# Patient Record
Sex: Male | Born: 1970 | Race: White | Hispanic: No | Marital: Married | Smoking: Current every day smoker
Health system: Southern US, Community
[De-identification: ages and names within clinical notes are randomized; demographics above are authoritative.]

## PROBLEM LIST (undated history)

## (undated) DIAGNOSIS — G8929 Other chronic pain: Secondary | ICD-10-CM

## (undated) DIAGNOSIS — J302 Other seasonal allergic rhinitis: Secondary | ICD-10-CM

## (undated) DIAGNOSIS — I480 Paroxysmal atrial fibrillation: Secondary | ICD-10-CM

## (undated) DIAGNOSIS — IMO0002 Reserved for concepts with insufficient information to code with codable children: Secondary | ICD-10-CM

## (undated) DIAGNOSIS — G4733 Obstructive sleep apnea (adult) (pediatric): Secondary | ICD-10-CM

## (undated) DIAGNOSIS — E785 Hyperlipidemia, unspecified: Secondary | ICD-10-CM

## (undated) DIAGNOSIS — Q0700 Arnold-Chiari syndrome without spina bifida or hydrocephalus: Secondary | ICD-10-CM

## (undated) DIAGNOSIS — K635 Polyp of colon: Secondary | ICD-10-CM

## (undated) DIAGNOSIS — G4730 Sleep apnea, unspecified: Secondary | ICD-10-CM

## (undated) DIAGNOSIS — Z9989 Dependence on other enabling machines and devices: Secondary | ICD-10-CM

## (undated) DIAGNOSIS — K648 Other hemorrhoids: Secondary | ICD-10-CM

## (undated) DIAGNOSIS — Z87442 Personal history of urinary calculi: Secondary | ICD-10-CM

## (undated) HISTORY — PX: ADENOIDECTOMY: SUR15

## (undated) HISTORY — DX: Obstructive sleep apnea (adult) (pediatric): G47.33

## (undated) HISTORY — DX: Arnold-Chiari syndrome without spina bifida or hydrocephalus: Q07.00

## (undated) HISTORY — DX: Other seasonal allergic rhinitis: J30.2

## (undated) HISTORY — DX: Other hemorrhoids: K64.8

## (undated) HISTORY — DX: Sleep apnea, unspecified: G47.30

## (undated) HISTORY — DX: Hyperlipidemia, unspecified: E78.5

## (undated) HISTORY — DX: Polyp of colon: K63.5

## (undated) HISTORY — DX: Paroxysmal atrial fibrillation: I48.0

## (undated) HISTORY — PX: CARDIOVERSION: SHX1299

## (undated) HISTORY — PX: SUBOCCIPITAL CRANIECTOMY CERVICAL LAMINECTOMY: SHX5404

## (undated) HISTORY — DX: Dependence on other enabling machines and devices: Z99.89

---

## 1997-09-20 HISTORY — PX: HIATAL HERNIA REPAIR: SHX195

## 1997-12-28 ENCOUNTER — Inpatient Hospital Stay (HOSPITAL_COMMUNITY): Admission: EM | Admit: 1997-12-28 | Discharge: 1997-12-29 | Payer: Self-pay | Admitting: Emergency Medicine

## 1998-06-25 ENCOUNTER — Observation Stay (HOSPITAL_COMMUNITY): Admission: EM | Admit: 1998-06-25 | Discharge: 1998-06-25 | Payer: Self-pay | Admitting: Emergency Medicine

## 1998-07-28 ENCOUNTER — Encounter: Admission: RE | Admit: 1998-07-28 | Discharge: 1998-10-20 | Payer: Self-pay | Admitting: Anesthesiology

## 1998-10-10 ENCOUNTER — Encounter: Payer: Self-pay | Admitting: Internal Medicine

## 1998-10-20 ENCOUNTER — Encounter: Payer: Self-pay | Admitting: Internal Medicine

## 1999-06-01 ENCOUNTER — Ambulatory Visit (HOSPITAL_COMMUNITY): Admission: RE | Admit: 1999-06-01 | Discharge: 1999-06-01 | Payer: Self-pay | Admitting: Neurosurgery

## 1999-06-01 ENCOUNTER — Encounter: Payer: Self-pay | Admitting: Neurosurgery

## 1999-08-15 ENCOUNTER — Encounter: Payer: Self-pay | Admitting: Cardiology

## 1999-08-15 ENCOUNTER — Emergency Department (HOSPITAL_COMMUNITY): Admission: EM | Admit: 1999-08-15 | Discharge: 1999-08-15 | Payer: Self-pay | Admitting: Emergency Medicine

## 2000-09-20 DIAGNOSIS — Q0700 Arnold-Chiari syndrome without spina bifida or hydrocephalus: Secondary | ICD-10-CM

## 2000-09-20 HISTORY — DX: Arnold-Chiari syndrome without spina bifida or hydrocephalus: Q07.00

## 2000-10-02 ENCOUNTER — Ambulatory Visit (HOSPITAL_COMMUNITY): Admission: RE | Admit: 2000-10-02 | Discharge: 2000-10-02 | Payer: Self-pay | Admitting: Neurosurgery

## 2000-10-02 ENCOUNTER — Encounter: Payer: Self-pay | Admitting: Neurosurgery

## 2000-11-28 ENCOUNTER — Encounter: Payer: Self-pay | Admitting: Neurosurgery

## 2000-11-30 ENCOUNTER — Encounter: Payer: Self-pay | Admitting: Neurosurgery

## 2000-11-30 ENCOUNTER — Inpatient Hospital Stay (HOSPITAL_COMMUNITY): Admission: RE | Admit: 2000-11-30 | Discharge: 2000-12-03 | Payer: Self-pay | Admitting: Neurosurgery

## 2000-12-01 ENCOUNTER — Encounter: Payer: Self-pay | Admitting: Neurosurgery

## 2003-01-15 ENCOUNTER — Encounter: Admission: RE | Admit: 2003-01-15 | Discharge: 2003-01-15 | Payer: Self-pay | Admitting: Internal Medicine

## 2003-01-15 ENCOUNTER — Encounter: Payer: Self-pay | Admitting: Internal Medicine

## 2004-10-19 ENCOUNTER — Ambulatory Visit: Payer: Self-pay | Admitting: Family Medicine

## 2005-07-06 ENCOUNTER — Ambulatory Visit: Payer: Self-pay | Admitting: Internal Medicine

## 2005-07-07 ENCOUNTER — Ambulatory Visit: Payer: Self-pay | Admitting: Cardiology

## 2005-07-07 ENCOUNTER — Ambulatory Visit: Payer: Self-pay | Admitting: Internal Medicine

## 2005-10-26 ENCOUNTER — Ambulatory Visit: Payer: Self-pay | Admitting: Internal Medicine

## 2005-11-09 ENCOUNTER — Ambulatory Visit: Payer: Self-pay | Admitting: Cardiology

## 2005-11-16 ENCOUNTER — Encounter: Payer: Self-pay | Admitting: Pulmonary Disease

## 2005-11-16 ENCOUNTER — Ambulatory Visit (HOSPITAL_BASED_OUTPATIENT_CLINIC_OR_DEPARTMENT_OTHER): Admission: RE | Admit: 2005-11-16 | Discharge: 2005-11-16 | Payer: Self-pay | Admitting: Internal Medicine

## 2005-11-20 ENCOUNTER — Ambulatory Visit: Payer: Self-pay | Admitting: Pulmonary Disease

## 2005-12-15 ENCOUNTER — Ambulatory Visit: Payer: Self-pay | Admitting: Pulmonary Disease

## 2006-01-14 ENCOUNTER — Ambulatory Visit: Payer: Self-pay | Admitting: Pulmonary Disease

## 2006-04-20 ENCOUNTER — Emergency Department (HOSPITAL_COMMUNITY): Admission: EM | Admit: 2006-04-20 | Discharge: 2006-04-20 | Payer: Self-pay | Admitting: Emergency Medicine

## 2006-04-20 ENCOUNTER — Ambulatory Visit: Payer: Self-pay | Admitting: Cardiovascular Disease

## 2006-04-29 ENCOUNTER — Ambulatory Visit: Payer: Self-pay | Admitting: Cardiology

## 2006-11-08 ENCOUNTER — Encounter: Admission: RE | Admit: 2006-11-08 | Discharge: 2006-11-08 | Payer: Self-pay | Admitting: Orthopedic Surgery

## 2007-07-17 ENCOUNTER — Ambulatory Visit: Payer: Self-pay | Admitting: Internal Medicine

## 2007-07-17 ENCOUNTER — Telehealth (INDEPENDENT_AMBULATORY_CARE_PROVIDER_SITE_OTHER): Payer: Self-pay | Admitting: *Deleted

## 2007-08-27 ENCOUNTER — Emergency Department (HOSPITAL_COMMUNITY): Admission: EM | Admit: 2007-08-27 | Discharge: 2007-08-27 | Payer: Self-pay | Admitting: Emergency Medicine

## 2007-08-27 ENCOUNTER — Ambulatory Visit: Payer: Self-pay | Admitting: Internal Medicine

## 2007-09-19 ENCOUNTER — Ambulatory Visit: Payer: Self-pay | Admitting: Internal Medicine

## 2008-01-08 ENCOUNTER — Ambulatory Visit: Payer: Self-pay | Admitting: Internal Medicine

## 2008-07-04 ENCOUNTER — Ambulatory Visit: Payer: Self-pay | Admitting: Internal Medicine

## 2008-07-04 LAB — CONVERTED CEMR LAB
Bilirubin Urine: NEGATIVE
Blood in Urine, dipstick: NEGATIVE
Glucose, Urine, Semiquant: NEGATIVE
Inflenza A Ag: NEGATIVE
Influenza B Ag: NEGATIVE
Ketones, urine, test strip: NEGATIVE
Nitrite: NEGATIVE
Protein, U semiquant: NEGATIVE
Specific Gravity, Urine: 1.015
Urobilinogen, UA: 0.2
WBC Urine, dipstick: NEGATIVE
pH: 6

## 2008-07-31 ENCOUNTER — Ambulatory Visit: Payer: Self-pay | Admitting: Internal Medicine

## 2008-07-31 DIAGNOSIS — H8110 Benign paroxysmal vertigo, unspecified ear: Secondary | ICD-10-CM | POA: Insufficient documentation

## 2008-07-31 DIAGNOSIS — R519 Headache, unspecified: Secondary | ICD-10-CM | POA: Insufficient documentation

## 2008-07-31 DIAGNOSIS — R51 Headache: Secondary | ICD-10-CM | POA: Insufficient documentation

## 2008-10-18 ENCOUNTER — Ambulatory Visit: Payer: Self-pay | Admitting: Family Medicine

## 2008-10-18 DIAGNOSIS — S139XXA Sprain of joints and ligaments of unspecified parts of neck, initial encounter: Secondary | ICD-10-CM | POA: Insufficient documentation

## 2008-10-18 LAB — CONVERTED CEMR LAB
Inflenza A Ag: NEGATIVE
Influenza B Ag: NEGATIVE
Rapid Strep: NEGATIVE

## 2008-10-29 ENCOUNTER — Telehealth: Payer: Self-pay | Admitting: Family Medicine

## 2008-11-01 ENCOUNTER — Telehealth (INDEPENDENT_AMBULATORY_CARE_PROVIDER_SITE_OTHER): Payer: Self-pay | Admitting: *Deleted

## 2009-01-15 DIAGNOSIS — E785 Hyperlipidemia, unspecified: Secondary | ICD-10-CM | POA: Insufficient documentation

## 2009-01-15 DIAGNOSIS — I4891 Unspecified atrial fibrillation: Secondary | ICD-10-CM | POA: Insufficient documentation

## 2009-01-16 ENCOUNTER — Ambulatory Visit: Payer: Self-pay | Admitting: Internal Medicine

## 2009-01-16 ENCOUNTER — Encounter: Payer: Self-pay | Admitting: Cardiology

## 2009-03-25 ENCOUNTER — Ambulatory Visit: Payer: Self-pay | Admitting: Family Medicine

## 2009-03-25 DIAGNOSIS — M549 Dorsalgia, unspecified: Secondary | ICD-10-CM | POA: Insufficient documentation

## 2009-04-21 ENCOUNTER — Encounter: Payer: Self-pay | Admitting: Cardiology

## 2009-05-02 ENCOUNTER — Telehealth: Payer: Self-pay | Admitting: Internal Medicine

## 2009-06-02 ENCOUNTER — Ambulatory Visit: Payer: Self-pay | Admitting: Internal Medicine

## 2009-06-06 ENCOUNTER — Encounter (INDEPENDENT_AMBULATORY_CARE_PROVIDER_SITE_OTHER): Payer: Self-pay | Admitting: *Deleted

## 2009-06-06 LAB — CONVERTED CEMR LAB
Basophils Absolute: 0.2 10*3/uL — ABNORMAL HIGH (ref 0.0–0.1)
Basophils Relative: 1 % (ref 0.0–3.0)
Eosinophils Absolute: 0.1 10*3/uL (ref 0.0–0.7)
Eosinophils Relative: 5 % (ref 0.0–5.0)
HCT: 43.5 % (ref 39.0–52.0)
Hemoglobin: 14.2 g/dL (ref 13.0–17.0)
Lymphocytes Relative: 46 % (ref 12.0–46.0)
Lymphs Abs: 1.9 10*3/uL (ref 0.7–4.0)
MCHC: 32.5 g/dL (ref 30.0–36.0)
MCV: 83.2 fL (ref 78.0–100.0)
Monocytes Absolute: 1.4 10*3/uL — ABNORMAL HIGH (ref 0.1–1.0)
Monocytes Relative: 18 % — ABNORMAL HIGH (ref 3.0–12.0)
Neutro Abs: 1.9 10*3/uL (ref 1.4–7.7)
Neutrophils Relative %: 30 % — ABNORMAL LOW (ref 43.0–77.0)
Platelets: 187 10*3/uL (ref 150.0–400.0)
RBC: 5.23 M/uL (ref 4.22–5.81)
RDW: 13.3 % (ref 11.5–14.6)
WBC: 5.5 10*3/uL (ref 4.5–10.5)

## 2009-09-18 ENCOUNTER — Ambulatory Visit: Payer: Self-pay | Admitting: Internal Medicine

## 2009-09-20 DIAGNOSIS — K635 Polyp of colon: Secondary | ICD-10-CM

## 2009-09-20 HISTORY — DX: Polyp of colon: K63.5

## 2009-09-20 HISTORY — PX: COLONOSCOPY: SHX174

## 2009-12-24 ENCOUNTER — Encounter: Payer: Self-pay | Admitting: Internal Medicine

## 2009-12-25 ENCOUNTER — Encounter: Payer: Self-pay | Admitting: Pulmonary Disease

## 2010-02-24 ENCOUNTER — Ambulatory Visit: Payer: Self-pay | Admitting: Internal Medicine

## 2010-02-24 DIAGNOSIS — G8929 Other chronic pain: Secondary | ICD-10-CM | POA: Insufficient documentation

## 2010-02-24 DIAGNOSIS — M545 Low back pain, unspecified: Secondary | ICD-10-CM | POA: Insufficient documentation

## 2010-02-24 DIAGNOSIS — IMO0002 Reserved for concepts with insufficient information to code with codable children: Secondary | ICD-10-CM | POA: Insufficient documentation

## 2010-02-24 DIAGNOSIS — M546 Pain in thoracic spine: Secondary | ICD-10-CM | POA: Insufficient documentation

## 2010-02-25 ENCOUNTER — Ambulatory Visit: Payer: Self-pay | Admitting: Internal Medicine

## 2010-02-27 ENCOUNTER — Encounter (INDEPENDENT_AMBULATORY_CARE_PROVIDER_SITE_OTHER): Payer: Self-pay | Admitting: *Deleted

## 2010-03-04 ENCOUNTER — Telehealth (INDEPENDENT_AMBULATORY_CARE_PROVIDER_SITE_OTHER): Payer: Self-pay | Admitting: *Deleted

## 2010-03-15 ENCOUNTER — Encounter: Payer: Self-pay | Admitting: Pulmonary Disease

## 2010-03-16 ENCOUNTER — Ambulatory Visit: Payer: Self-pay | Admitting: Pulmonary Disease

## 2010-03-16 DIAGNOSIS — Z9989 Dependence on other enabling machines and devices: Secondary | ICD-10-CM

## 2010-03-16 DIAGNOSIS — G4733 Obstructive sleep apnea (adult) (pediatric): Secondary | ICD-10-CM | POA: Insufficient documentation

## 2010-04-06 ENCOUNTER — Ambulatory Visit: Payer: Self-pay | Admitting: Internal Medicine

## 2010-04-09 ENCOUNTER — Ambulatory Visit: Payer: Self-pay | Admitting: Internal Medicine

## 2010-04-09 DIAGNOSIS — K219 Gastro-esophageal reflux disease without esophagitis: Secondary | ICD-10-CM | POA: Insufficient documentation

## 2010-04-10 ENCOUNTER — Ambulatory Visit: Payer: Self-pay | Admitting: Internal Medicine

## 2010-04-10 LAB — HM COLONOSCOPY

## 2010-04-13 ENCOUNTER — Encounter: Payer: Self-pay | Admitting: Internal Medicine

## 2010-05-18 ENCOUNTER — Encounter: Payer: Self-pay | Admitting: Internal Medicine

## 2010-05-27 ENCOUNTER — Ambulatory Visit: Payer: Self-pay | Admitting: Internal Medicine

## 2010-06-17 ENCOUNTER — Encounter: Payer: Self-pay | Admitting: Internal Medicine

## 2010-07-28 ENCOUNTER — Encounter: Payer: Self-pay | Admitting: Internal Medicine

## 2010-08-04 ENCOUNTER — Ambulatory Visit: Payer: Self-pay | Admitting: Internal Medicine

## 2010-08-07 ENCOUNTER — Encounter: Payer: Self-pay | Admitting: Internal Medicine

## 2010-08-12 ENCOUNTER — Encounter: Payer: Self-pay | Admitting: Internal Medicine

## 2010-09-02 ENCOUNTER — Ambulatory Visit: Payer: Self-pay | Admitting: Internal Medicine

## 2010-09-12 ENCOUNTER — Telehealth: Payer: Self-pay | Admitting: Adult Health

## 2010-09-15 ENCOUNTER — Telehealth: Payer: Self-pay | Admitting: Internal Medicine

## 2010-09-15 ENCOUNTER — Encounter: Payer: Self-pay | Admitting: Internal Medicine

## 2010-09-15 ENCOUNTER — Telehealth (INDEPENDENT_AMBULATORY_CARE_PROVIDER_SITE_OTHER): Payer: Self-pay | Admitting: *Deleted

## 2010-09-22 ENCOUNTER — Ambulatory Visit
Admission: RE | Admit: 2010-09-22 | Discharge: 2010-09-22 | Payer: Self-pay | Source: Home / Self Care | Attending: Internal Medicine | Admitting: Internal Medicine

## 2010-09-22 ENCOUNTER — Encounter: Payer: Self-pay | Admitting: Internal Medicine

## 2010-09-22 DIAGNOSIS — M5126 Other intervertebral disc displacement, lumbar region: Secondary | ICD-10-CM | POA: Insufficient documentation

## 2010-09-24 ENCOUNTER — Telehealth: Payer: Self-pay | Admitting: Internal Medicine

## 2010-09-24 ENCOUNTER — Encounter: Payer: Self-pay | Admitting: Internal Medicine

## 2010-10-07 LAB — BASIC METABOLIC PANEL
BUN: 14 mg/dL (ref 6–23)
CO2: 28 mEq/L (ref 19–32)
Calcium: 9.2 mg/dL (ref 8.4–10.5)
Chloride: 104 mEq/L (ref 96–112)
Creatinine, Ser: 1.19 mg/dL (ref 0.4–1.5)
GFR calc Af Amer: >60 mL/min (ref >60)
GFR calc non Af Amer: >60 mL/min (ref >60)
Glucose, Bld: 122 mg/dL — ABNORMAL HIGH (ref 70–99)
Potassium: 4.3 mEq/L (ref 3.5–5.1)
Sodium: 141 mEq/L (ref 135–145)

## 2010-10-07 LAB — CBC
HCT: 40.6 % (ref 39.0–52.0)
Hemoglobin: 13.3 g/dL (ref 13.0–17.0)
MCH: 27.4 pg (ref 26.0–34.0)
MCHC: 32.8 g/dL (ref 30.0–36.0)
MCV: 83.7 fL (ref 78.0–100.0)
Platelets: 207 10*3/uL (ref 150–400)
RBC: 4.85 MIL/uL (ref 4.22–5.81)
RDW: 13.6 % (ref 11.5–15.5)
WBC: 10.3 10*3/uL (ref 4.0–10.5)

## 2010-10-07 LAB — SURGICAL PCR SCREEN
MRSA, PCR: NEGATIVE
Staphylococcus aureus: NEGATIVE

## 2010-10-08 ENCOUNTER — Ambulatory Visit (HOSPITAL_COMMUNITY)
Admission: RE | Admit: 2010-10-08 | Discharge: 2010-10-09 | Payer: Self-pay | Source: Home / Self Care | Attending: Orthopedic Surgery | Admitting: Orthopedic Surgery

## 2010-10-08 HISTORY — PX: OTHER SURGICAL HISTORY: SHX169

## 2010-10-15 NOTE — Op Note (Addendum)
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  OPERATIVE PROCEDURE:  Anterior lumbar diskectomy and decompression at L5- S1, left side.  COMPLICATIONS:  None.  CONDITION:  Stable.  BLOOD LOSS:  Minimal.  HISTORY:  This is a very pleasant 40 year old gentleman who is having significant back, buttock, and left leg pain.  He had a positive straight leg raise test, reproduction of pain with straight leg maneuvering and release of symptoms with a 5-1 injection.  After discussing treatment options, we elected proceed with a lumbar decompression.  I did tell him that if it still remained tight that I would extend the decompression up to 4-5.  However intraoperatively once I had the bone spur and the diskectomy complete at 5-1, I was able to freely pass a Woodson elevator superiorly towards L4 and there was no significant compression and so I elected not to extend the decompression.  OPERATIVE NOTE:  The patient was brought to the operating room and placed supine on the operating table.  After successful induction of general anesthesia and intubation, TEDs and SCDs were applied.  He was turned prone onto the Blue Ball frame.  All bony prominences were well padded.  The back was prepped and draped in a standard fashion. Appropriate time-out was done to confirm patient, procedure, extremity affected, and all other pertinent port data.  Once this was completed, two needles were placed in the lateral side of the spine and an x-ray was taken for  marker films.  An incision was made approximately an inch and half in length.  Sharp dissection was carried out down to and through the deep fascia.  Deep fascia was sharply incised in the midline and I used Cobb elevator and stripped paraspinal muscles to expose the L5-S1 space.  A Penfield #4 was placed underneath the L5 lamina. Intraoperative x-ray was taken to confirm that we are at the appropriate level.  Once this was done, a Cytogeneticist was placed along the lateral aspect of the facet and I performed a generous laminotomy of L5. I then performed a partial laminotomy of the superior S1 and then I released the ligamentum flavum.  At this point, I noted a very large epidural vein which I coagulated with bipolar electrocautery.  I mobilized the significant epidural fat and exposed the thecal sac.  I then identified the S1 traversing nerve root, protected it with D'Errico and then continued my dissection on the lateral recess.  I did a very wide decompression in order to adequately decompress the lateral recess. I then went down and performed a generous foraminotomy.  At this point, I had an excellent bony decompression.  I then incised  the annulus with a #15 blade scalpel and using micropituitary rongeurs, curettes, and an Epstein curette I was able to remove the bulk of the soft disk and debride the significant osteophyte.  At this point, I had an adequate diskectomy and I was able to relieve most of the osteophyte.  At this point, I could take a [facility] and passed it circumferentially at the disk space and I could go superiorly in the lateral recess and centrally and inferiorly and down the lateral recess and out the S1 neural foramen without any neural compression.  At this point, I was satisfied with the decompression.  I irrigated it copiously with normal saline, obtained hemostasis using bipolar electrocautery, and then placed a thrombin-soaked Gelfoam patty over the  exposed thecal sac and then closed the deep fascia with interrupted #1 Vicryl sutures, superficial with 2-0 Vicryl sutures and 3-0 Monocryl.  Steri-Strips and dry dressing were applied.  The patient was extubated and transferred to PACU without incident.  At the end of the case, needle and sponge counts were correct.     Alvy Beal, MD     DDB/MEDQ  D:  10/08/2010  T:  10/09/2010  Job:  [id number]  Electronically Signed by Venita Lick MD on 10/15/2010 11:15:45 AM

## 2010-10-22 NOTE — Letter (Signed)
Summary: [facility] Surgical Clearance   [facility] Surgical Clearance   Imported By: Roderic Ovens 10/16/2010 14:52:39  _____________________________________________________________________  External Attachment:    Type:   Image     Comment:   External Document

## 2010-10-22 NOTE — Assessment & Plan Note (Signed)
Summary: discuss back issue/cbs   Vital Signs:  Patient profile:   40 year old male Weight:      289 pounds BMI:     36.25 Temp:     98.5 degrees F oral Pulse rate:   76 / minute Resp:     17 per minute BP sitting:   134 / 80  (left arm) Cuff size:   large  Vitals Entered By: Shonna Chock CMA (August 04, 2010 4:21 PM) CC: Back concerns: Patient had spinal injection last Thursday and since then it is very painful to sit down. Patient contacted Dr.Nudelman's office today and did not hear back yet, Back pain   Primary Care Provider:  Marga Melnick, MD  CC:  Back concerns: Patient had spinal injection last Thursday and since then it is very painful to sit down. Patient contacted Dr.Nudelman's office today and did not hear back yet and Back pain.  History of Present Illness: Back Pain      This is a 40 year old man who presents with Back pain.  The patient denies fever, chills, weakness, loss of sensation, fecal incontinence, urinary incontinence, and urinary retention.  The pain is located in the left low back.  The pain radiates to the left  groin.  The pain is made worse by  sitting & is better lying down. ESI 11/10  "made me worse". "Thrashing around in pain 11/12 caused AF; 3 Flecanides  took care of it". Dr Jule Ser recommended fusion.   Current Medications (verified): 1)  Gabapentin 600 Mg Tabs (Gabapentin) .Marland Kitchen.. 1 By Mouth Three Times A Day 2)  Flecainide Acetate 100 Mg Tabs (Flecainide Acetate) .... Take 3 Tablets As Needed For Atrial Fibrillation. 3)  Ibuprofen 800 Mg Tabs (Ibuprofen) .Marland Kitchen.. 1 By Mouth Three Times A Day As Needed 4)  Ambien 10 Mg Tabs (Zolpidem Tartrate) .Marland Kitchen.. 1 By Mouth At Bedtime  Allergies (verified): No Known Drug Allergies  Physical Exam  General:  Very uncomfortable but in no acute distress; alert,appropriate and cooperative throughout examination Msk:  Sitting on L cheek; resisting sitting normally Neurologic:  strength normal in all extremities and  RLE hyporeflexia.  Slight foot drop suggested LLE Skin:  Intact without suspicious lesions or rashes Psych:  Frustrated & agitated   Impression & Recommendations:  Problem # 1:  LOW BACK PAIN SYNDROME (ICD-724.2) Multifacted; acute exacerbation The following medications were removed from the medication list:    Cyclobenzaprine Hcl 5 Mg Tabs (Cyclobenzaprine hcl) .Marland Kitchen... 1-2 at bedtime as needed His updated medication list for this problem includes:    Ibuprofen 800 Mg Tabs (Ibuprofen) .Marland Kitchen... 1 by mouth three times a day as needed    Cyclobenzaprine Hcl 5 Mg Tabs (Cyclobenzaprine hcl) .Marland Kitchen... 1 two times a day & 1-2 at bedtime as needed  Complete Medication List: 1)  Gabapentin 600 Mg Tabs (Gabapentin) .Marland Kitchen.. 1 by mouth three times a day 2)  Flecainide Acetate 100 Mg Tabs (Flecainide acetate) .... Take 3 tablets as needed for atrial fibrillation. 3)  Ibuprofen 800 Mg Tabs (Ibuprofen) .Marland Kitchen.. 1 by mouth three times a day as needed 4)  Ambien 10 Mg Tabs (Zolpidem tartrate) .Marland Kitchen.. 1 by mouth at bedtime 5)  Cyclobenzaprine Hcl 5 Mg Tabs (Cyclobenzaprine hcl) .Marland Kitchen.. 1 two times a day & 1-2 at bedtime as needed 6)  Cymbalta 60 Mg Cpep (Duloxetine hcl) .Marland Kitchen.. 1 each am  Patient Instructions: 1)  Do not take Flexeril & Cymbalta within 8 hrs of each other. Do not  take Cymbalta if on Flecanide. See Dr Ethelene Hal  Prescriptions: CYMBALTA 60 MG CPEP (DULOXETINE HCL) 1 each am  #30 x 2   Entered and Authorized by:   Marga Melnick MD   Signed by:   Marga Melnick MD on 08/04/2010   Method used:   Print then Give to Patient   RxID:   [id number] CYCLOBENZAPRINE HCL 5 MG TABS (CYCLOBENZAPRINE HCL) 1 two times a day & 1-2 at bedtime as needed  #20 x 0   Entered and Authorized by:   Marga Melnick MD   Signed by:   Marga Melnick MD on 08/04/2010   Method used:   Print then Give to Patient   RxID:   [id number]    Orders Added: 1)  Est. Patient Level III [84696]

## 2010-10-22 NOTE — Assessment & Plan Note (Signed)
Summary: consult for osa   Copy to:  Marga Melnick Primary Provider/Referring Provider:  Marga Melnick MD  CC:  Sleep Consult.  Former pt of Dr. Shelle Iron.  Last seen 2007. Marland Kitchen  History of Present Illness: The pt is a 40y/o male who I have been asked to see for osa.  I have seen the pt in the distant past, but has not followed up in over 4 years.  He has a h/o severe osa, with npsg in 2007 showing an AHI of 76/hr with desat to 85%.  He was found to have an optimal cpap pressure of 15cm.  The pt tells me that he is wearing cpap compliantly with full face mask and heated humidification.  He feels that he sleeps well, and denies his bed partner seeing breakthru snoring.  He goes to bed btw 10-11pm, and arises at 6am rested.  He is satisfied with his alertness during the day, and denies any inappropriate daytime sleepiness.  He drives a lot as part of his occupation, and denies any sleepiness with driving.  His epworth score today is only 6.  I have downloaded his machine for the last 6mos, and this shows a 98% compliance using the device for 4 or more hours a night.  His average daily use is 7.5 hrs.  Current Medications (verified): 1)  Tramadol Hcl 50 Mg Tabs (Tramadol Hcl) .Marland Kitchen.. 1 Every 6 Hrs As Needed Pain 2)  Gabapentin 100 Mg Caps (Gabapentin) .Marland Kitchen.. 1 Every 8 Hrs For  Nerve Pain 3)  Cyclobenzaprine Hcl 5 Mg Tabs (Cyclobenzaprine Hcl) .Marland Kitchen.. 1-2 At Bedtime As Needed  Allergies (verified): No Known Drug Allergies  Past History:  Past Medical History: Reviewed history from 01/15/2009 and no changes required. 1. Paroxysmal atrial fibrillation 2. Obesity 3. Previously normal left ventricular function 4. Ongoing cigarette use 5. Hyperlipidemia 6. Obstructive sleep apnea on CPAP 7. History of Arnold-Chiari malformation, status post craniotomy 2003 8. History of chronic headaches, improved following craniotomy 9. Ongoing tobacco abuse 10. History of medication nonadherence  Past Surgical  History: Reviewed history from 07/31/2008 and no changes required. abd u/s 1/172007 adenoidectomy-11/2000 afib onset 1995; Cardiversion [id number]  hh/erd-1999 afib 03/2003 PAF 04/2006; Chiari Malformation Decompression  Surgery 2002 ,Dr Newell Coral  Family History: Reviewed history from 01/15/2009 and no changes required. Mother has a history of hypertension.   Father has a history of CAD, status post CABG at age 83.   Social History: Reviewed history from 03/25/2009 and no changes required. 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 Tobacco Use - Quit 09/2008. smoked 1 ppd x approx 20 years.  Alcohol Use - no  Review of Systems       The patient complains of irregular heartbeats and joint stiffness or pain.  The patient denies shortness of breath with activity, shortness of breath at rest, productive cough, non-productive cough, coughing up blood, chest pain, acid heartburn, indigestion, loss of appetite, weight change, abdominal pain, difficulty swallowing, sore throat, tooth/dental problems, headaches, nasal congestion/difficulty breathing through nose, sneezing, itching, ear ache, anxiety, depression, hand/feet swelling, rash, change in color of mucus, and fever.    Vital Signs:  Patient profile:   40 year old male Height:      75 inches Weight:      280.31 pounds BMI:     35.16 O2 Sat:      96 % on Room air Temp:     98.0 degrees F oral Pulse rate:   70 / minute  BP sitting:   122 / 74  (left arm) Cuff size:   large  Vitals Entered By: Arman Filter LPN (March 16, 2010 9:11 AM)  O2 Flow:  Room air CC: Sleep Consult.  Former pt of Dr. Shelle Iron.  Last seen 2007.  Comments Medications reviewed with patient Arman Filter LPN  March 16, 2010 9:11 AM    Physical Exam  General:  ow male in nad Eyes:  PERRLA and EOMI.   Nose:  large turbinates with congestion and partial obstruction of nasal airway Mouth:  elongation of soft palate and  uvula. Neck:  no jvd, tmg, LN Lungs:  clear to auscultation Heart:  rrr, no mrg Abdomen:  soft and nontender, bs+ Extremities:  no edema or cyanosis pulses intact distally Neurologic:  alert and oriented, moves all 4.   Impression & Recommendations:  Problem # 1:  OBSTRUCTIVE SLEEP APNEA (ICD-327.23)  The pt is wearing cpap very compliantly by his download, feels he is sleeping well, and denies any significant sleepiness during the day.  He also denies any sleepiness with driving.  I have asked him to work aggressively on weight loss, and to continue with his cpap usage.  If he sees a worsening of his daytime alertness before his next f/u visit in one year, he is to call me so that we can re-calibrate his pressure.  I have also asked him to keep up with supplies and maintenance on his cpap machine.  Other Orders: Consultation Level IV (27253)  Patient Instructions: 1)  work on weight loss 2)  If you feel you are not resting as well over the next one year prior to our followup visit, please give [place] a call. 3)  followup with me in one year.

## 2010-10-22 NOTE — Letter (Signed)
Summary: [facility]  [facility]   Imported By: Lanelle Bal 08/18/2010 08:25:12  _____________________________________________________________________  External Attachment:    Type:   Image     Comment:   External Document

## 2010-10-22 NOTE — Procedures (Signed)
Summary: EGD/[facility]  [facility]/ [facility]   Imported By: Sherian Rein 04/10/2010 13:24:09  _____________________________________________________________________  External Attachment:    Type:   Image     Comment:   External Document

## 2010-10-22 NOTE — Assessment & Plan Note (Signed)
Summary: rov/ gd   Referring Provider:  Marga Melnick Primary Provider:  Marga Melnick MD  CC:  ROV/ pt reports no episodes of Afib since past fall.  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  Marland Kitchen  History of Present Illness: [name] is seen in followup for paroxysmal atrial fibrillation. Since having last been seen he's had a couple of episodes where in he has had recurrent atrial fibrillation. On one occasion he has been cardioverted on 2 subsequent occasions the last 6 months he has used the flecanide  "pill in the pocket" regime one with rapid success ; the other with delayed reversion.    The patient denies SOB, chest pain, edema.    Current Medications (verified): 1)  Gabapentin 100 Mg Caps (Gabapentin) .Marland Kitchen.. 1 Every 8 Hrs For  Nerve Pain Up To 900 Mg Daily 2)  Cyclobenzaprine Hcl 5 Mg Tabs (Cyclobenzaprine Hcl) .Marland Kitchen.. 1-2 At Bedtime As Needed 3)  Flecainide Acetate 100 Mg Tabs (Flecainide Acetate) .... Take 3 Tablets As Needed For Atrial Fibrillation.  Allergies (verified): No Known Drug Allergies  Past History:  Past Medical History: Last updated: 01/15/2009 1. Paroxysmal atrial fibrillation 2. Obesity 3. Previously normal left ventricular function 4. Ongoing cigarette use 5. Hyperlipidemia 6. Obstructive sleep apnea on CPAP 7. History of Arnold-Chiari malformation, status post craniotomy 2003 8. History of chronic headaches, improved following craniotomy 9. Ongoing tobacco abuse 10. History of medication nonadherence  Past Surgical History: Last updated: 07/31/2008 abd u/s 1/172007 adenoidectomy-11/2000 afib onset 1995; Cardiversion [id number]  hh/erd-1999 afib 03/2003 PAF 04/2006; Chiari Malformation Decompression  Surgery 2002 ,Dr Newell Coral  Family History: Last updated: 03/16/2010 Mother has a history of hypertension.   Father has a history of CAD, status post CABG at age 61.   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 Tobacco Use - Quit 09/2008. smoked 1 ppd x approx 20 years.  Alcohol Use - no  Vital Signs:  Patient profile:   40 year old male Height:      75 inches Weight:      284 pounds BMI:     35.63 Pulse rate:   77 / minute Pulse rhythm:   regular BP sitting:   126 / 84  (left arm) Cuff size:   large  Vitals Entered By: [name] CMA (April 06, 2010 8:44 AM)  Physical Exam  General:  The patient was alert and oriented in no acute distress. HEENT Normal.  Neck veins were flat, carotids were brisk.  Lungs were clear.  Heart sounds were regular but distant without murmurs or gallops.  Abdomen was soft with active bowel sounds without heapatomegaly There is no clubbing cyanosis or edema. Skin Warm and dry  Head:      Impression & Recommendations:  Problem # 1:  ATRIAL FIBRILLATION, PAROXYSMAL (ICD-427.31) stable; will continue him on his current regime. He asked about catheter ablation. I suggested that given the infrequency of his episodes the brevity of an and the rapid reversion to sinus rhythm that pursuing catheter ablation at this time with a mobile he premature. I offered him the opportunity to speak with Dr. Johney Frame. We will keep this on the table would not pursue it at the present time His updated medication list for this problem includes:    Flecainide Acetate 100 Mg Tabs (Flecainide acetate) .Marland Kitchen... Take 3 tablets as needed for atrial fibrillation.  Orders: EKG w/ Interpretation (93000)  Patient Instructions: 1)  Your  physician wants you to follow-up in:12 months with Dr [name].   You will receive a reminder letter in the mail two months in advance. If you don't receive a letter, please call our office to schedule the follow-up appointment. Prescriptions: FLECAINIDE ACETATE 100 MG TABS (FLECAINIDE ACETATE) take 3 tablets as needed for atrial fibrillation.  #30 x 6   Entered by:   Optometrist BSN   Authorized by:   Nathen May,  MD, [facility]   Signed by:   Gypsy Balsam RN BSN on 04/06/2010   Method used:   Electronically to        [facility] (retail)       [address]       [place], [place]  [address]       Ph: [phone]       Fax: [phone]   RxID:   [id number]

## 2010-10-22 NOTE — Progress Notes (Signed)
Summary: Muscle Relaxer  Phone Note Call from Patient Call back at Home Phone [phone] Call back at Work Phone [phone]   Caller: Patient Summary of Call: Patient called and wanted to let you know that Neurotin is helping some, but would like to have a muscle realxer called in if Hopp would so. He knows that Alfonse Flavors has said before that it doesnt mix well with sleep apnea, but he said he had takin them before.   Pharmacy is [facility] on [address] Initial call taken by: Harold Barban,  March 04, 2010 3:14 PM  Follow-up for Phone Call        Dr.Hopper please advise Follow-up by: Shonna Chock,  March 04, 2010 3:23 PM  Additional Follow-up for Phone Call Additional follow up Details #1::        Left message on VM informing patient rx sent in Additional Follow-up by: Shonna Chock,  March 05, 2010 8:49 AM    New/Updated Medications: CYCLOBENZAPRINE HCL 5 MG TABS (CYCLOBENZAPRINE HCL) 1-2 at bedtime as needed Prescriptions: CYCLOBENZAPRINE HCL 5 MG TABS (CYCLOBENZAPRINE HCL) 1-2 at bedtime as needed  #30 x 1   Entered and Authorized by:   Marga Melnick MD   Signed by:   Marga Melnick MD on 03/05/2010   Method used:   Electronically to        [facility] (retail)       [address]       [place], [place]  [address]       Ph: [phone]       Fax: [phone]   RxID:   [id number]

## 2010-10-22 NOTE — Procedures (Signed)
Summary: Colonoscopy  Patient: [name] Note: All result statuses are Final unless otherwise noted.  Tests: (1) Colonoscopy (COL)   COL Colonoscopy           DONE     [facility]     [address]     [place], [place]  [address]           COLONOSCOPY PROCEDURE REPORT           PATIENT:  Jacob, Meyers  MR#:  [id number]     BIRTHDATE:  Jan 24, 1971, 39 yrs. old  GENDER:  male     ENDOSCOPIST:  Wilhemina Bonito. Eda Keys, MD     REF. BY:  Office     PROCEDURE DATE:  04/10/2010     PROCEDURE:  Colonoscopy with snare polypectomy X 1     ASA CLASS:  Class II     INDICATIONS:  rectal bleeding     MEDICATIONS:   Fentanyl 100 mcg IV, Versed 10 mg IV           DESCRIPTION OF PROCEDURE:   After the risks benefits and     alternatives of the procedure were thoroughly explained, informed     consent was obtained.  Digital rectal exam was performed and     revealed no abnormalities.   The LB [id number] endoscope     was introduced through the anus and advanced to the cecum, which     was identified by both the appendix and ileocecal valve, without     limitations.TIME TO CECUM = 2:35 MIN.  The quality of the prep was     excellent, using MoviPrep.  The instrument was then slowly     withdrawn (TIME = 11:14 MIN) as the colon was fully examined.     <<PROCEDUREIMAGES>>           FINDINGS:  A diminutive polyp was found in the descending colon.     Polyp was snared without cautery. Retrieval was successful.   This     was otherwise a normal examination of the colon.   Retroflexed     views in the rectum revealed internal hemorrhoids.    The scope     was then withdrawn from the patient and the procedure completed.           COMPLICATIONS:  None     ENDOSCOPIC IMPRESSION:     1) Diminutive polyp in the descending colon - REMOVED     2) Otherwise normal examination     3) Small Internal hemorrhoids (source of bleeding0           RECOMMENDATIONS:     1) Repeat colonoscopy in 5 years  if polyp adenomatous; otherwise     10 years     2) Hemorrhoid information sheet           ______________________________     Wilhemina Bonito. Eda Keys, MD           CC:  Pecola Lawless, MD;The Patient           n.     Rosalie DoctorWilhemina Bonito. Eda Keys at 04/10/2010 12:32 PM           Brunetti, Viviann Spare, [phone]  Note: An exclamation mark (!) indicates a result that was not dispersed into the flowsheet. Document Creation Date: 04/10/2010 12:33 PM _______________________________________________________________________  (1) Order result status: Final Collection or observation date-time: 04/10/2010 12:27 Requested date-time:  Receipt  date-time:  Reported date-time:  Referring Physician:   Ordering Physician: Fransico Setters [phone]) Specimen Source:  Source: Launa Grill Order Number: [id number] Lab site:   Appended Document: Colonoscopy recall 5 yrs     Procedures Next Due Date:    Colonoscopy: 03/2015

## 2010-10-22 NOTE — Letter (Signed)
Summary: CMN for CPAP/[facility]  CMN for CPAP/[facility]   Imported By: Lanelle Bal 08/22/2010 11:55:10  _____________________________________________________________________  External Attachment:    Type:   Image     Comment:   External Document

## 2010-10-22 NOTE — Progress Notes (Signed)
Summary: pt needs letter of clearence asap  Phone Note Call from Patient Call back at Home Phone [phone]   Caller: Patient Reason for Call: Talk to Nurse, Talk to Doctor Complaint: Breathing Problems Summary of Call: Dr. Shon Baton from [facility] needs the letter of clearence for patient asap. pt wife faxed over clearence form [employer] and they really need this done. Dr. [name] has already approved the surgery but they wont set a date until they get letter of clearence and pt's back is really hurting. Initial call taken by: Omer Jack,  September 24, 2010 2:37 PM  Follow-up for Phone Call        this was already faxed but not scanned. will refax. Claris Gladden, RN, BSN refaxed approval to [phone], left msg to adv pt.  Follow-up by: Claris Gladden RN,  September 24, 2010 3:53 PM

## 2010-10-22 NOTE — Letter (Signed)
Summary: [facility]   Imported By: Lanelle Bal 06/02/2010 09:39:41  _____________________________________________________________________  External Attachment:    Type:   Image     Comment:   External Document

## 2010-10-22 NOTE — Progress Notes (Signed)
Summary: Pulmonary/[facility]  Pulmonary/[facility]   Imported By: Sherian Rein 03/16/2010 14:29:25  _____________________________________________________________________  External Attachment:    Type:   Image     Comment:   External Document

## 2010-10-22 NOTE — Letter (Signed)
Summary: CMN Request for CPAP/[facility]  CMN Request for CPAP/[facility]   Imported By: Lanelle Bal 12/30/2009 10:29:11  _____________________________________________________________________  External Attachment:    Type:   Image     Comment:   External Document

## 2010-10-22 NOTE — Progress Notes (Signed)
Summary: returning your call  Phone Note Call from Patient Call back at Work Phone [phone] Call back at [phone]   Caller: Spouse Reason for Call: Talk to Nurse Summary of Call: returning your call. Initial call taken by: Roe Coombs,  September 24, 2010 4:01 PM  Follow-up for Phone Call        spk s/pt and adv refaxed approval to [facility]. Follow-up by: Claris Gladden RN,  September 24, 2010 4:55 PM

## 2010-10-22 NOTE — Letter (Signed)
Summary: [facility] Instructions  [facility]  [address] [place], [place] [address]   Phone: [phone]  Fax: [phone]       EARLEY GROBE    [birth year]    MRN: [id number]        Procedure Day /Date:FRIDAY 04/10/10     Arrival Time:10:00 AM     Procedure Time:11:00 AM     Location of Procedure:                    X  [facility] (4th Floor)    PREPARATION FOR COLONOSCOPY WITH MOVIPREP   Starting 5 days prior to your procedure NOW/TODAYdo not eat nuts, seeds, popcorn, corn, beans, peas,  salads, or any raw vegetables.  Do not take any fiber supplements (e.g. Metamucil, Citrucel, and Benefiber).  THE DAY BEFORE YOUR PROCEDURE         DATE: 7/21  MWU:XLKGM  1.  Drink clear liquids the entire day-NO SOLID FOOD  2.  Do not drink anything colored red or purple.  Avoid juices with pulp.  No orange juice.  3.  Drink at least 64 oz. (8 glasses) of fluid/clear liquids during the day to prevent dehydration and help the prep work efficiently.  CLEAR LIQUIDS INCLUDE: Water Jello Ice Popsicles Tea (sugar ok, no milk/cream) Powdered fruit flavored drinks Coffee (sugar ok, no milk/cream) Gatorade Juice: apple, white grape, white cranberry  Lemonade Clear bullion, consomm, broth Carbonated beverages (any kind) Strained chicken noodle soup Hard Candy                             4.  In the morning, mix first dose of MoviPrep solution:    Empty 1 Pouch A and 1 Pouch B into the disposable container    Add lukewarm drinking water to the top line of the container. Mix to dissolve    Refrigerate (mixed solution should be used within 24 hrs)  5.  Begin drinking the prep at 5:00 p.m. The MoviPrep container is divided by 4 marks.   Every 15 minutes drink the solution down to the next mark (approximately 8 oz) until the full liter is complete.   6.  Follow completed prep with 16 oz of clear liquid of your choice (Nothing red or purple).  Continue to  drink clear liquids until bedtime.  7.  Before going to bed, mix second dose of MoviPrep solution:    Empty 1 Pouch A and 1 Pouch B into the disposable container    Add lukewarm drinking water to the top line of the container. Mix to dissolve    Refrigerate  THE DAY OF YOUR PROCEDURE      DATE: 04/10/10 DAY: FRIDAY  Beginning at 6:00 a.m. (5 hours before procedure):         1. Every 15 minutes, drink the solution down to the next mark (approx 8 oz) until the full liter is complete.  2. Follow completed prep with 16 oz. of clear liquid of your choice.    3. You may drink clear liquids until 9:00 AM (2 HOURS BEFORE PROCEDURE).   MEDICATION INSTRUCTIONS  Unless otherwise instructed, you should take regular prescription medications with a small sip of water   as early as possible the morning of your procedure.         OTHER INSTRUCTIONS  You will need a responsible adult at least 42 years of age to  accompany you and drive you home.   This person must remain in the waiting room during your procedure.  Wear loose fitting clothing that is easily removed.  Leave jewelry and other valuables at home.  However, you may wish to bring a book to read or  an iPod/MP3 player to listen to music as you wait for your procedure to start.  Remove all body piercing jewelry and leave at home.  Total time from sign-in until discharge is approximately 2-3 hours.  You should go home directly after your procedure and rest.  You can resume normal activities the  day after your procedure.  The day of your procedure you should not:   Drive   Make legal decisions   Operate machinery   Drink alcohol   Return to work  You will receive specific instructions about eating, activities and medications before you leave.    The above instructions have been reviewed and explained to me by   _______________________    I fully understand and can verbalize these instructions  _____________________________ Date _________

## 2010-10-22 NOTE — Assessment & Plan Note (Signed)
Summary: rectal pain and bleeding   History of Present Illness Visit Type: consult Primary GI MD: Yancey Flemings MD Primary Provider: Marga Melnick, MD Requesting Provider: Marga Melnick, MD Chief Complaint: rectal bleeding and occasional  pain History of Present Illness:   40 year old white male with paroxysmal atrial fibrillation, obstructive sleep apnea, hyperlipidemia, obesity, tobacco abuse, GERD, and Arnold-Chiari malformation status post craniotomy 2003. The patient is sent today regarding rectal bleeding. He was last seen in 2000 for GERD when he underwent upper endoscopy which was normal except for a small sliding hernia. Patient denies any significant reflux symptoms and is on no reflux medication. No dysphagia. In terms of rectal bleeding, he reports a one-year history of intermittent bleeding generally manifested by blood on the tissue. Occasional rectal discomfort. Sometimes difficult to clean the feces from the anal region. One severe episode with blood in the stool and in the toilet bowl, but this is rare. Also has some discomfort in the sacral region reports some radiation down the legs. Has known spine disease for which he is being evaluated. He denies change in bowel habits or excellent weight loss. He is concerned about the bleeding as he has acquaintance who was diagnosed with colon cancer at age 45. His father has a history of precancerous colon polyps.   GI Review of Systems    Reports acid reflux and  heartburn.      Denies abdominal pain, belching, bloating, chest pain, dysphagia with liquids, dysphagia with solids, loss of appetite, nausea, vomiting, vomiting blood, weight loss, and  weight gain.      Reports rectal bleeding and  rectal pain.     Denies anal fissure, black tarry stools, change in bowel habit, constipation, diarrhea, diverticulosis, fecal incontinence, heme positive stool, hemorrhoids, irritable bowel syndrome, jaundice, light color stool, and  liver  problems. Preventive Screening-Counseling & Management      Drug Use:  no.      Current Medications (verified): 1)  Gabapentin 100 Mg Caps (Gabapentin) .Marland Kitchen.. 1 Every 8 Hrs For  Nerve Pain Up To 900 Mg Daily 2)  Cyclobenzaprine Hcl 5 Mg Tabs (Cyclobenzaprine Hcl) .Marland Kitchen.. 1-2 At Bedtime As Needed 3)  Flecainide Acetate 100 Mg Tabs (Flecainide Acetate) .... Take 3 Tablets As Needed For Atrial Fibrillation. 4)  Ibuprofen 200 Mg Tabs (Ibuprofen) .... Take 3 Tablets As Needed For Pain  Allergies (verified): No Known Drug Allergies  Past History:  Past Medical History: Reviewed history from 04/06/2010 and no changes required. 1. Paroxysmal atrial fibrillation 2. Obesity 3. Previously normal left ventricular function 4. Ongoing cigarette use 5. Hyperlipidemia 6. Obstructive sleep apnea on CPAP 7. History of Arnold-Chiari malformation, status post craniotomy 2003 8. History of chronic headaches, improved following craniotomy 9. Ongoing tobacco abuse 10. History of medication nonadherence GERD  Past Surgical History: Reviewed history from 07/31/2008 and no changes required. abd u/s 1/172007 adenoidectomy-11/2000 afib onset 1995; [facility] [id number]  hh/erd-1999 afib 03/2003 PAF 04/2006; Chiari Malformation Decompression  Surgery 2002 ,Dr Newell Coral  Family History: Mother has a history of hypertension.   Father has a history of CAD, status post CABG at age 57.  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 smoked 1 ppd x approx 20 years.  Alcohol Use - no Daily Caffeine Use 4 cups/day Illicit Drug Use - no Drug Use:  no  Review of  Systems       The patient complains of arthritis/joint pain, back pain, and heart rhythm changes.  The patient denies allergy/sinus, anemia, anxiety-new, blood in  urine, breast changes/lumps, confusion, cough, coughing up blood, depression-new, fainting, fatigue, fever, headaches-new, hearing problems, heart murmur, itching, muscle pains/cramps, night sweats, nosebleeds, shortness of breath, skin rash, sleeping problems, sore throat, swelling of feet/legs, swollen lymph glands, thirst - excessive, urination - excessive, urination changes/pain, urine leakage, vision changes, and voice change.    Vital Signs:  Patient profile:   40 year old male Height:      75 inches Weight:      281 pounds BMI:     35.25 Pulse rate:   80 / minute Pulse rhythm:   regular BP sitting:   140 / 86  (left arm) Cuff size:   large  Vitals Entered By: Francee Piccolo CMA Duncan Dull) (April 09, 2010 9:03 AM)  Physical Exam  General:  Well developed, well nourished, no acute distress. Head:  Normocephalic and atraumatic. Eyes:  PERRLA, no icterus. Ears:  Normal auditory acuity. Nose:  No deformity, discharge,  or lesions. Mouth:  No deformity or lesions, dentition normal. Neck:  Supple; no masses or thyromegaly. Lungs:  Clear throughout to auscultation. Heart:  Regular rate and rhythm; no murmurs, rubs,  or bruits. Abdomen:  Soft, nontender and nondistended. No masses, hepatosplenomegaly or hernias noted. Normal bowel sounds. Rectal:  no external abnormalities. No tenderness, mass, or obvious fissure Msk:  Symmetrical with no gross deformities. Normal posture. Pulses:  Normal pulses noted. Extremities:  No clubbing, cyanosis, edema or deformities noted. Neurologic:  Alert and  oriented x4;  Skin:  Intact without significant lesions or rashes. Psych:  Alert and cooperative. Normal mood and affect.   Impression & Recommendations:  Problem # 1:  RECTAL BLEEDING (ICD-569.3) intermittent rectal bleeding as described. Based on history, most likely fissure. None apparent today. Need to rule out more proximal lesion such as polyp.  Plan:   #1. Colonoscopy. The nature of  the procedure as well as the risks, benefits, and alternatives were reviewed. He understood and agreed to proceed. #2. Movi prep prescribed. The patient instructed on its use  Problem # 2:  FAMILY HX OF COLONIC POLYPS (ICD-V18.51) in his father. Plan colonoscopy as above  Problem # 3:  GERD (ICD-530.81) recurrent symptoms not requiring medication. No alarm features. Recommend reflux precautions and antacids or H2 receptor antagonist p.r.n.  Other Orders: Colonoscopy (Colon)  Patient Instructions: 1)  Colon [facility] 04/10/10 11:00 am arrive at 10:00 am 2)  Movi prep instructions given. 3)  Movi prep Rx. sent to pharmacy. 4)  Colonoscopy and Flexible Sigmoidoscopy brochure given.  5)  Copy sent to : Marga Melnick, MD 6)  The medication list was reviewed and reconciled.  All changed / newly prescribed medications were explained.  A complete medication list was provided to the patient / caregiver. Prescriptions: MOVIPREP 100 GM  SOLR (PEG-KCL-NACL-NASULF-NA ASC-C) As per prep instructions.  #1 x 0   Entered by:   Milford Cage NCMA   Authorized by:   Hilarie Fredrickson MD   Signed by:   Milford Cage NCMA on 04/09/2010   Method used:   Electronically to        [facility]       [address]       [place], [place]  [address]  Ph: [phone]       Fax: [phone]   RxID:   [id number]

## 2010-10-22 NOTE — Letter (Signed)
Summary: CMN request for CPAP Supplies/[facility]  CMN request for CPAP Supplies/[facility]   Imported By: Sherian Rein 01/19/2010 15:06:15  _____________________________________________________________________  External Attachment:    Type:   Image     Comment:   External Document

## 2010-10-22 NOTE — Letter (Signed)
Summary: [facility]  [facility]   Imported By: Lennie Odor 06/26/2010 10:29:19  _____________________________________________________________________  External Attachment:    Type:   Image     Comment:   External Document

## 2010-10-22 NOTE — Assessment & Plan Note (Signed)
Summary: sinus inf///sph   Vital Signs:  Patient profile:   40 year old male Weight:      282 pounds BMI:     35.37 Temp:     98.0 degrees F oral Pulse rate:   80 / minute Resp:     15 per minute BP sitting:   134 / 82  (left arm) Cuff size:   large  Vitals Entered By: Shonna Chock CMA (September 02, 2010 2:48 PM) CC: Sinus Infection, headache and nausea, URI symptoms   Primary Care Provider:  Marga Melnick, MD  CC:  Sinus Infection, headache and nausea, and URI symptoms.  History of Present Illness:      This is a 40 year old man who presents with RTI symptoms since 12/9,onset as ST.  The patient reports nasal congestion, purulent nasal discharge, productive cough, and earache.  The patient denies fever, dyspnea, and wheezing.  The patient also reports R frontal & R occipital  headache.  The patient denies the following risk factors for Strep sinusitis: unilateral facial pain, tooth pain, and tender adenopathy. Rx: Mucinex, Neti pot , nasal steroid    Current Medications (verified): 1)  Gabapentin 600 Mg Tabs (Gabapentin) .Marland Kitchen.. 1 By Mouth Three Times A Day 2)  Flecainide Acetate 100 Mg Tabs (Flecainide Acetate) .... Take 3 Tablets As Needed For Atrial Fibrillation. 3)  Ibuprofen 800 Mg Tabs (Ibuprofen) .Marland Kitchen.. 1 By Mouth Three Times A Day As Needed 4)  Ambien 10 Mg Tabs (Zolpidem Tartrate) .Marland Kitchen.. 1 By Mouth At Bedtime 5)  Cyclobenzaprine Hcl 5 Mg Tabs (Cyclobenzaprine Hcl) .Marland Kitchen.. 1 Two Times A Day & 1-2 At Bedtime As Needed 6)  Cymbalta 60 Mg Cpep (Duloxetine Hcl) .Marland Kitchen.. 1 Each Am  Allergies (verified): No Known Drug Allergies  Physical Exam  General:  well-nourished,in no acute distress; alert,appropriate and cooperative throughout examination Ears:  External ear exam shows no significant lesions or deformities.  Otoscopic examination reveals  wax bilaterally Nose:  External nasal examination shows no deformity or inflammation. Nasal mucosa are erythematous  without lesions or  exudates.Septum to R. Hyponasal speech Mouth:  Oral mucosa and oropharynx without lesions or exudates.  Teeth in good repair. Lungs:  Normal respiratory effort, chest expands symmetrically. Lungs are clear to auscultation, no crackles or wheezes. Cervical Nodes:  No lymphadenopathy noted Axillary Nodes:  No palpable lymphadenopathy   Impression & Recommendations:  Problem # 1:  SINUSITIS, ACUTE (ICD-461.9)  His updated medication list for this problem includes:    Fluticasone Propionate 50 Mcg/act Susp (Fluticasone propionate) .Marland Kitchen... 1 spray two times a day as needed    Amoxicillin-pot Clavulanate 875-125 Mg Tabs (Amoxicillin-pot clavulanate) .Marland Kitchen... 1every 12 hrs with a meal  Problem # 2:  BRONCHITIS-ACUTE (ICD-466.0)  His updated medication list for this problem includes:    Amoxicillin-pot Clavulanate 875-125 Mg Tabs (Amoxicillin-pot clavulanate) .Marland Kitchen... 1every 12 hrs with a meal  Complete Medication List: 1)  Gabapentin 600 Mg Tabs (Gabapentin) .Marland Kitchen.. 1 by mouth three times a day 2)  Flecainide Acetate 100 Mg Tabs (Flecainide acetate) .... Take 3 tablets as needed for atrial fibrillation. 3)  Ibuprofen 800 Mg Tabs (Ibuprofen) .Marland Kitchen.. 1 by mouth three times a day as needed 4)  Ambien 10 Mg Tabs (Zolpidem tartrate) .Marland Kitchen.. 1 by mouth at bedtime 5)  Cyclobenzaprine Hcl 5 Mg Tabs (Cyclobenzaprine hcl) .Marland Kitchen.. 1 two times a day & 1-2 at bedtime as needed 6)  Cymbalta 60 Mg Cpep (Duloxetine hcl) .Marland Kitchen.. 1 each am 7)  Fluticasone  Propionate 50 Mcg/act Susp (Fluticasone propionate) .Marland Kitchen.. 1 spray two times a day as needed 8)  Amoxicillin-pot Clavulanate 875-125 Mg Tabs (Amoxicillin-pot clavulanate) .Marland Kitchen.. 1every 12 hrs with a meal  Patient Instructions: 1)  Drink as much NON dairy  fluid as you can tolerate for the next few days. Prescriptions: AMOXICILLIN-POT CLAVULANATE 875-125 MG TABS (AMOXICILLIN-POT CLAVULANATE) 1every 12 hrs with a meal  #20 x 0   Entered and Authorized by:   Marga Melnick MD   Signed  by:   Marga Melnick MD on 09/02/2010   Method used:   Faxed to ...       [facility]       [address]       [place], [place]  [address]       Ph: [phone]       Fax: [phone]   RxID:   [id number] FLUTICASONE PROPIONATE 50 MCG/ACT SUSP (FLUTICASONE PROPIONATE) 1 spray two times a day as needed  #1 x 11   Entered and Authorized by:   Marga Melnick MD   Signed by:   Marga Melnick MD on 09/02/2010   Method used:   Faxed to ...       [facility]       [address]       [place], [place]  [address]       Ph: [phone]       Fax: [phone]   RxID:   [id number]    Orders Added: 1)  Est. Patient Level III [[address]]

## 2010-10-22 NOTE — Assessment & Plan Note (Signed)
Summary: SINUS INFECTION/KN   Vital Signs:  Patient profile:   40 year old male Weight:      284.8 pounds Temp:     97.9 degrees F oral Pulse rate:   56 / minute Resp:     16 per minute BP sitting:   140 / 82  (left arm) Cuff size:   large  Vitals Entered By: Shonna Chock CMA (May 27, 2010 12:01 PM) CC: 1.) Sinus Infection since yesterday   2.) Back concerns-follow-up from [facility], URI symptoms   Primary Care Provider:  Marga Melnick, MD  CC:  1.) Sinus Infection since yesterday   2.) Back concerns-follow-up from [facility] and URI symptoms.  History of Present Illness: URI Symptoms      This is a 40 year old man who presents with URI symptoms as of last night, onset as ST & burning nose.  The patient reports nasal congestion, purulent nasal discharge, sore throat, and earache(not today,only last night, R > L), but denies productive cough.  The patient denies fever, dyspnea, and wheezing.  The patient also reports itchy watery eyes & sneezing chronically , and new frontal  headache.  The patient denies the following risk factors for Strep sinusitis:  facial pain, tooth pain, and tender adenopathy. KG:MWNUUV, steroid nasal spray    Current Medications (verified): 1)  Gabapentin 100 Mg Caps (Gabapentin) .Marland Kitchen.. 1 Every 8 Hrs For  Nerve Pain Up To 900 Mg Daily 2)  Cyclobenzaprine Hcl 5 Mg Tabs (Cyclobenzaprine Hcl) .Marland Kitchen.. 1-2 At Bedtime As Needed 3)  Flecainide Acetate 100 Mg Tabs (Flecainide Acetate) .... Take 3 Tablets As Needed For Atrial Fibrillation. 4)  Ibuprofen 200 Mg Tabs (Ibuprofen) .... Take 3 Tablets As Needed For Pain  Allergies (verified): No Known Drug Allergies  Physical Exam  General:  well-nourished,in no acute distress; alert,appropriate and cooperative throughout examination Ears:  External ear exam shows no significant lesions or deformities.  Otoscopic examination reveals clear canals, tympanic membranes are intact bilaterally without bulging, retraction,  inflammation or discharge. Hearing is grossly normal bilaterally.Some wax blaterally Nose:  External nasal examination shows no deformity or inflammation. Nasal mucosa are pink and moist without lesions or exudates. Mouth:  Oral mucosa and oropharynx without lesions or exudates.  Teeth in good repair. Uvular erythema.   Lungs:  Normal respiratory effort, chest expands symmetrically. Lungs are clear to auscultation, no crackles or wheezes. Heart:  Normal rate and regular rhythm. S1 and S2 normal without gallop, murmur, click, rub .S4 Cervical Nodes:  No lymphadenopathy noted Axillary Nodes:  No palpable lymphadenopathy   Impression & Recommendations:  Problem # 1:  SINUSITIS- ACUTE-NOS (ICD-461.9)  His updated medication list for this problem includes:    Amoxicillin 500 Mg Caps (Amoxicillin) .Marland Kitchen... 1 three times a day  Complete Medication List: 1)  Gabapentin 100 Mg Caps (Gabapentin) .Marland Kitchen.. 1 every 8 hrs for  nerve pain up to 900 mg daily 2)  Cyclobenzaprine Hcl 5 Mg Tabs (Cyclobenzaprine hcl) .Marland Kitchen.. 1-2 at bedtime as needed 3)  Flecainide Acetate 100 Mg Tabs (Flecainide acetate) .... Take 3 tablets as needed for atrial fibrillation. 4)  Ibuprofen 200 Mg Tabs (Ibuprofen) .... Take 3 tablets as needed for pain 5)  Amoxicillin 500 Mg Caps (Amoxicillin) .Marland Kitchen.. 1 three times a day  Patient Instructions: 1)  Neti pot once daily - two times a day as needed until sinuses are clear. Drink as much NON dairy  fluid as you can tolerate for the next few days. Plain  Mucinex as per package if secretions are thick. Prescriptions: AMOXICILLIN 500 MG CAPS (AMOXICILLIN) 1 three times a day  #30 x 0   Entered and Authorized by:   Marga Melnick MD   Signed by:   Marga Melnick MD on 05/27/2010   Method used:   Faxed to ...       [facility]       [address]       [place], [place]  [address]       Ph: [phone]       Fax: [phone]   RxID:   [id number]

## 2010-10-22 NOTE — Progress Notes (Signed)
  Phone Note Call from Patient   Reason for Call: Acute Illness Action Taken: Phone Call Completed Summary of Call: Patient states he went back in to atrial fibrillation last evening around 12 midnight. He has a history of PAF and uses flecinide 300mg  "pill in the pocket" for assistance with this. He took the flecinide last night, but states that his HR is stil elevated at 120-140bpm. He wants to know if he should take additional flecinide.  I have spoken with Dr. [name] who advised not to take additional flecinide doses. He suggests the patient wait an additional 24 hours.  If he does not convert he should be seen in ER for DCCV.  The patient is reluctant to come to ER as this is Christmas weekend.  I have adivsed him that if he waits greater than 60 hours from onset, he will have to be placed on anticoagulation.  He verbalizes understanding.  He will come tomorrow if his HR is still in Afib. Initial call taken by: Joni Reining NP

## 2010-10-22 NOTE — Consult Note (Signed)
Summary[facility]   Imported By: Sherian Rein 04/10/2010 13:25:48  _____________________________________________________________________  External Attachment:    Type:   Image     Comment:   External Document

## 2010-10-22 NOTE — Letter (Signed)
Summary: New Patient letter  [facility]  [address] [place], [place] [address]   Phone: [phone]  Fax: [phone]       02/27/2010 MRN: [id number]  [facility] [address] [place], [place]  [address]  Dear [name],  Welcome to the Gastroenterology Division at [facility].    You are scheduled to see Dr.  Marina Goodell on 04-09-10 at 9:15a.m. on the 3rd floor at [facility], [address]  We ask that you try to arrive at our office 15 minutes prior to your appointment time to allow for check-in.  We would like you to complete the enclosed self-administered evaluation form prior to your visit and bring it with you on the day of your appointment.  We will review it with you.  Also, please bring a complete list of all your medications or, if you prefer, bring the medication bottles and we will list them.  Please bring your insurance card so that we may make a copy of it.  If your insurance requires a referral to see a specialist, please bring your referral form from your primary care physician.  Co-payments are due at the time of your visit and may be paid by cash, check or credit card.     Your office visit will consist of a consult with your physician (includes a physical exam), any laboratory testing he/she may order, scheduling of any necessary diagnostic testing (e.g. x-ray, ultrasound, CT-scan), and scheduling of a procedure (e.g. Endoscopy, Colonoscopy) if required.  Please allow enough time on your schedule to allow for any/all of these possibilities.    If you cannot keep your appointment, please call [phone] to cancel or reschedule prior to your appointment date.  This allows [place] the opportunity to schedule an appointment for another patient in need of care.  If you do not cancel or reschedule by 5 p.m. the business day prior to your appointment date, you will be charged a $50.00 late cancellation/no-show fee.    Thank you for choosing  [facility] for your medical needs.  We appreciate the opportunity to care for you.  Please visit [place] at our website  to learn more about our practice.                     Sincerely,                                                             The Gastroenterology Division

## 2010-10-22 NOTE — Letter (Signed)
Summary: Patient Notice- Polyp Results  [facility]  [address] [place], [place] [address]   Phone: [phone]  Fax: [phone]        April 13, 2010 MRN: [id number]    [facility] [address] [place], [place]  [address]    Dear Mr. ROESLER,  I am pleased to inform you that the colon polyp(s) removed during your recent colonoscopy was (were) found to be benign (no cancer detected) upon pathologic examination.  I recommend you have a repeat colonoscopy examination in 5 years to look for recurrent polyps, as having colon polyps increases your risk for having recurrent polyps or even colon cancer in the future.  Should you develop new or worsening symptoms of abdominal pain, bowel habit changes or bleeding from the rectum or bowels, please schedule an evaluation with either your primary care physician or with me.  Additional information/recommendations:  __ No further action with gastroenterology is needed at this time. Please      follow-up with your primary care physician for your other healthcare      needs.   Please call us if you are having persistent problems or have questions about your condition that have not been fully answered at this time.  Sincerely,  Hilarie Fredrickson MD  This letter has been electronically signed by your physician.  Appended Document: Patient Notice- Polyp Results letter mailed

## 2010-10-22 NOTE — Letter (Signed)
Summary: [facility] Surgical Clearance   [facility] Surgical Clearance   Imported By: Roderic Ovens 10/16/2010 16:27:55  _____________________________________________________________________  External Attachment:    Type:   Image     Comment:   External Document

## 2010-10-22 NOTE — Progress Notes (Signed)
Summary: calling regarding clearance for surgery  Phone Note Call from Patient Call back at [phone]   Caller: Patient Summary of Call: Pt calling regarding clearance for surgery Initial call taken by: Judie Grieve,  September 15, 2010 12:21 PM  Follow-up for Phone Call        pt aware that he is cleared for surgery. Follow-up by: Claris Gladden RN,  September 15, 2010 6:58 PM

## 2010-10-22 NOTE — Progress Notes (Signed)
Summary: lmom  12/27--needs 30 min pre-surg clear appt  Phone Note Call from Patient   Summary of Call: left message on A/M that patient needs to see Dr Alwyn Ren for a 30 min surgery clearance appt for his back surgery  (when appt has been scheduled, Maralyn Sago has paperwork to go to [place]) Initial call taken by: Jerolyn Shin,  September 15, 2010 9:18 AM  Follow-up for Phone Call        spoke w/ patient appt scheduled fro 09/22/09 for surgical clearance........Marland KitchenDoristine Devoid CMA  September 15, 2010 12:10 PM

## 2010-10-22 NOTE — Assessment & Plan Note (Signed)
Summary: SURGICAL CLEARANCE FOR BACK SURGERY/CDJ   Vital Signs:  Patient profile:   40 year old male Height:      75.25 inches Weight:      291.6 pounds BMI:     36.34 Temp:     98.6 degrees F oral Pulse rate:   64 / minute Resp:     14 per minute BP sitting:   136 / 88  (left arm) Cuff size:   large  Vitals Entered By: Shonna Chock CMA (September 22, 2010 8:02 AM) CC: Surgical Clearance (Back Surgery) , Back pain, Pre-op Evaluation   Primary Care Provider:  Marga Melnick, MD  CC:  Surgical Clearance (Back Surgery) , Back pain, and Pre-op Evaluation.  History of Present Illness:     Dr Shon Baton plans "laminectomy " & possibly "discectomy" this month; medical clearance has been requested." Dr [name] said  I was young & heal;thy & did not need to be seen". Dr [name] is aware of the AF on 12/24 ( see below).     The patient reports weakness in the LLE , but denies loss of sensation, fecal incontinence, urinary incontinence, and urinary retention.  The pain is located in the left low back.&  radiates to the left testicle & groin.  The pain is made worse by sitting.  The pain is made better by Gabapentin 600 mg three times a day on regular basis.Also it is improved by his having been off work  on vacation 2 weeks.     The patient denies respiratory , GI, cardiac symptoms. Specifically he has had no chest pain, edema, and PND.  Positive PMH placing the patient at moderate risk for surgery includes rhythm other than sinus. It took 13 hrs to convert AF on 09/12/2010.  There is no history of antiplatelet agents, chronic steroids, warfarin, diabetes meds, and bleeding disorder.    Current Medications (verified): 1)  Gabapentin 600 Mg Tabs (Gabapentin) .Marland Kitchen.. 1 By Mouth Three Times A Day 2)  Ibuprofen 800 Mg Tabs (Ibuprofen) .Marland Kitchen.. 1 By Mouth Three Times A Day As Needed 3)  Fluticasone Propionate 50 Mcg/act Susp (Fluticasone Propionate) .Marland Kitchen.. 1 Spray Two Times A Day As Needed  Allergies (verified): No  Known Drug Allergies  Past History:  Past Medical History: 1. Paroxysmal atrial fibrillation 2. Previously normal left ventricular function 5. Hyperlipidemia 6. Obstructive sleep apnea on CPAP 7. History of Arnold-Chiari malformation, status post craniotomy 2003 8. History of chronic headaches, improved following craniotomy 10.GERD  Past Surgical History: adenoidectomy-11/2000 PAF  onset 1995; Cardioversion 1996X 2,2008  hh/GERD 1999 Chiari Malformation Decompression  Surgery 2002 ,Dr Newell Coral  Family History: Mother: hypertension, MI @ 32.   Father: CAD, status post CABG at age 9, polyps.  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 smoked 1 ppd x approx 20 years.  Alcohol Use - no Daily Caffeine :1 cup   Review of Systems General:  Denies chills, fever, and sweats; OSA controlled wih CPAP. Resp:  Denies cough, excessive snoring, hypersomnolence, morning headaches, shortness of breath, sputum productive, and wheezing. GI:  Denies abdominal pain, bloody stools, constipation, dark tarry stools, diarrhea, and indigestion. GU:  Denies discharge and hematuria. Heme:  Denies abnormal bruising and bleeding.  Physical Exam  General:  well-nourished,in no acute distress; alert,appropriate and cooperative throughout examination Neck:  No deformities, masses, or tenderness noted. Lungs:  Normal respiratory effort, chest expands symmetrically. Lungs are  clear to auscultation, no crackles or wheezes. Heart:  Normal rate and regular rhythm. Accentuated  S1 ;S2 normal without gallop, murmur, click, rub or other extra sounds. Abdomen:  Bowel sounds positive,abdomen soft and non-tender without masses, organomegaly or hernias noted. Small ventral hernia Msk:  No deformity or scoliosis noted of thoracic or lumbar spine.   Pulses:  R and L carotid,radial,dorsalis pedis and posterior  tibial pulses are full and equal bilaterally Extremities:  No clubbing, cyanosis, edema. Discomfort @ 30 degrees with SLR bilaterally Neurologic:  alert & oriented X3 and DTRs asymmetrical ; 0+ @ L knee Skin:  Intact without suspicious lesions or rashes Psych:  memory intact for recent and remote, normally interactive, and good eye contact.     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 Take 3 tablets as needed for atrial fibrillation.  Orders: EKG w/ Interpretation (93000)  Problem # 3:  OBSTRUCTIVE SLEEP APNEA (ICD-327.23) as per Dr Shelle Iron  Complete Medication List: 1)  Gabapentin 600 Mg Tabs (Gabapentin) .Marland Kitchen.. 1 by mouth three times a day 2)  Ibuprofen 800 Mg Tabs (Ibuprofen) .Marland Kitchen.. 1 by mouth three times a day as needed 3)  Fluticasone Propionate 50 Mcg/act Susp (Fluticasone propionate) .Marland Kitchen.. 1 spray two times a day as needed  Patient Instructions: 1)  You are medically cleared for surgery. 2)  Avoid foods high in acid (tomatoes, citrus juices, spicy foods). Avoid eating within two hours of lying down or before exercising. Do not over eat; try smaller more frequent meals. Elevate head of bed twelve inches when sleeping.   Orders Added: 1)  Est. Patient Level IV [[address]] 2)  EKG w/ Interpretation [93000]

## 2010-10-22 NOTE — Assessment & Plan Note (Signed)
Summary: back pain//swollen toes//lch   Vital Signs:  Patient profile:   40 year old male Weight:      280 pounds Temp:     98.7 degrees F oral Pulse rate:   76 / minute Resp:     16 per minute BP sitting:   130 / 82  (left arm) Cuff size:   large  Vitals Entered By: Shonna Chock (February 24, 2010 4:26 PM) CC: 1.) Back and Neck Pain x several months-no known injury   2.) Swollen toes on both feet since Saturday, Back pain Comments REVIEWED MED LIST, PATIENT AGREED DOSE AND INSTRUCTION CORRECT    CC:  1.) Back and Neck Pain x several months-no known injury   2.) Swollen toes on both feet since Saturday and Back pain.  History of Present Illness:  Back Pain      This is a 40 year old man who presents with back pain @ 2 different levels.  The patient reports loss of sensation in R  & L  forearm & hand unrelated to either  back pain due to ulnar nerve injury as per  [facility] based on NCT/EMG studies.He has  rest pain in upper & lower back, but denies fever, chills, weakness, fecal incontinence, urinary incontinence, urinary retention, dysuria, inability to work, and inability to care for self.  The pains are  located in the right low back and mid- R  thoracic back.  The R LS  pain began at home, suddenly last Summer  after using sledge hammer.  The intrascapular pain started 6 months ago w/o trigger or injury. The LS  pain is made worse by sitting still as when driving.  The LS  pain is made better by NSAID medications, moving and  bending forward.  Risk factors for serious underlying conditions include  intermittent duration of pain > 1 month and bedrest with no relief.  Physical Therapy Rxed UE splints for sleep but these are difficult to use due to need forCPAP  for Sleep Apnea. 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 Appt with Dr [name] 07/18.  He believes the PAF is exacerbated by  back pain. PMH of severe  headaches due to  [facility]- Chiari Malformation ; S/P resection by Dr Newell Coral in 2003. With those headaches; he had PAF more frequently. Also swelling & pain in medial toenail area for 3 days. He questioned relationship to his back but paronychia suggested clinically.  Allergies (verified): No Known Drug Allergies  Review of Systems GI:  Denies abdominal pain and dark tarry stools; Intermittent rectal bleeding; PMH of HH  treated by Dr Marina Goodell. His wife questions rectal bleeding & back pain might be related. MS:  R heel pain since fracture 2008.  Physical Exam  General:  well-nourished,in no acute distress; alert,appropriate and cooperative throughout examination Neck:  No deformities, masses, or tenderness noted. Lungs:  Normal respiratory effort, chest expands symmetrically. Lungs are clear to auscultation, no crackles or wheezes. Heart:  Normal rate and regular rhythm. accentuated  S1 ; S2 normal without gallop, murmur, click, rub or other extra sounds. Abdomen:  Bowel sounds positive,abdomen soft and non-tender without masses, organomegaly or hernias noted. Msk:  Sat up from table w/o help Pulses:  R and L carotid,radia,dorsalis pedis and posterior tibial pulses are full and equal bilaterally Extremities:  No clubbing, cyanosis, edema, or deformity noted with  de reased  range of motion of all  legs. Negative SLR bilaterally. Paronychia @ medial great toe nails. Pads over 1st & 2nd PIP joits of each hand but not over Achilles tendon Neurologic:  alert & oriented X3, strength normal in all extremities, gait abnormal tiptoe & heel walking limited due to R heel  pain  & pain great toes,DTRs symmetrical and normal.   Skin:  Intact without suspicious lesions or rashes Psych:  depressed affect, flat affect, and subdued.     Impression & Recommendations:  Problem # 1:  LOW BACK PAIN SYNDROME (ICD-724.2)  The following medications were removed from the medication list:    Cyclobenzaprine Hcl 5  Mg Tabs (Cyclobenzaprine hcl) .Marland Kitchen... 1-2 at bedtime as needed His updated medication list for this problem includes:    Tramadol Hcl 50 Mg Tabs (Tramadol hcl) .Marland Kitchen... 1 every 6 hrs as needed pain  Orders: T-Thoracic Spine 2 Views [phone])  Problem # 2:  BACK PAIN, THORACIC REGION (ICD-724.1)  The following medications were removed from the medication list:    Cyclobenzaprine Hcl 5 Mg Tabs (Cyclobenzaprine hcl) .Marland Kitchen... 1-2 at bedtime as needed His updated medication list for this problem includes:    Tramadol Hcl 50 Mg Tabs (Tramadol hcl) .Marland Kitchen... 1 every 6 hrs as needed pain  Orders: T-Lumbar Spine Complete, 5 Views ([id number])  Problem # 3:  PARONYCHIA (ICD-681.9)  The following medications were removed from the medication list:    Avelox 400 Mg Tabs (Moxifloxacin hcl) .Marland Kitchen... 1 once daily His updated medication list for this problem includes:    Cephalexin 500 Mg Caps (Cephalexin) .Marland Kitchen... 1 two times a day  Problem # 4:  ATRIAL FIBRILLATION, PAROXYSMAL (ICD-427.31) as per Dr [name]  Problem # 5:  SLEEP APNEA (ICD-780.57) Work risks will be assessed by Dr Shelle Iron  Orders: Sleep Disorder Referral (Sleep Disorder)  Problem # 6:  RECTAL BLEEDING (ICD-569.3)  Orders: Gastroenterology Referral (GI)  Complete Medication List: 1)  Tramadol Hcl 50 Mg Tabs (Tramadol hcl) .Marland Kitchen.. 1 every 6 hrs as needed pain 2)  Gabapentin 100 Mg Caps (Gabapentin) .Marland Kitchen.. 1 every 8 hrs for  nerve pain 3)  Cephalexin 500 Mg Caps (Cephalexin) .Marland Kitchen.. 1 two times a day  Patient Instructions: 1)  Nizoral cream two times a day to affected  toenail area  & blow dry with hairdrier. Consider consulting Dr Sheran Luz, [facility] if no better with meds  Prescriptions: CEPHALEXIN 500 MG CAPS (CEPHALEXIN) 1 two times a day  #14 x 0   Entered and Authorized by:   Marga Melnick MD   Signed by:   Marga Melnick MD on 02/24/2010   Method used:   Faxed to ...       [facility]       [address]        [place], [place]  [address]       Ph: [phone]       Fax: [phone]   RxID:   [id number] GABAPENTIN 100 MG CAPS (GABAPENTIN) 1 every 8 hrs for  nerve pain  #30 x 1   Entered and Authorized by:   Marga Melnick MD   Signed by:   Marga Melnick MD on 02/24/2010   Method used:   F[facility]ed to ...       [facility]       [address]  [place], [place]  [address]       Ph: [phone]       Fax: [phone]   RxID:   [id number] TRAMADOL HCL 50 MG TABS (TRAMADOL HCL) 1 every 6 hrs as needed pain  #30 x 1   Entered and Authorized by:   Marga Melnick MD   Signed by:   Marga Melnick MD on 02/24/2010   Method used:   Faxed to ...       [facility]       [address]       [place], [place]  [address]       Ph: [phone]       Fax: [phone]   RxID:   [id number]

## 2010-10-22 NOTE — Letter (Signed)
Summary: [facility]  [facility]   Imported By: Lanelle Bal 08/12/2010 14:17:31  _____________________________________________________________________  External Attachment:    Type:   Image     Comment:   External Document

## 2010-11-06 ENCOUNTER — Encounter: Payer: Self-pay | Admitting: Internal Medicine

## 2010-11-06 ENCOUNTER — Ambulatory Visit (INDEPENDENT_AMBULATORY_CARE_PROVIDER_SITE_OTHER): Payer: BC Managed Care – PPO | Admitting: Internal Medicine

## 2010-11-06 ENCOUNTER — Other Ambulatory Visit: Payer: Self-pay | Admitting: Internal Medicine

## 2010-11-06 DIAGNOSIS — R002 Palpitations: Secondary | ICD-10-CM

## 2010-11-06 DIAGNOSIS — N509 Disorder of male genital organs, unspecified: Secondary | ICD-10-CM

## 2010-11-06 LAB — MAGNESIUM: Magnesium: 1.9 mg/dL (ref 1.5–2.5)

## 2010-11-06 LAB — CALCIUM: Calcium: 9.2 mg/dL (ref 8.4–10.5)

## 2010-11-06 LAB — TSH: TSH: 1.94 u[IU]/mL (ref 0.35–5.50)

## 2010-11-06 LAB — T4, FREE: Free T4: 1.02 ng/dL (ref 0.60–1.60)

## 2010-11-06 LAB — POTASSIUM: Potassium: 4.1 mEq/L (ref 3.5–5.1)

## 2010-11-11 NOTE — Assessment & Plan Note (Signed)
Summary: GROIN PAIN CONTINUES/RH....   Vital Signs:  Patient profile:   40 year old male Weight:      290 pounds BMI:     36.14 Pulse rate:   72 / minute Resp:     14 per minute BP sitting:   128 / 86  (left arm) Cuff size:   large  Vitals Entered By: Shonna Chock CMA (November 06, 2010 11:31 AM) CC: 1.) Groin pain, ongoing   2.) Irregular heartbeat, extra beats off/on-? related to groin pain , Abdominal pain, Palpitations   Primary Care Provider:  Marga Melnick, MD  CC:  1.) Groin pain, ongoing   2.) Irregular heartbeat, extra beats off/on-? related to groin pain , Abdominal pain, and Palpitations.  History of Present Illness:     Constant L groin pain since ESI in 07/2010. He  reports nausea, but denies vomiting, diarrhea, constipation, melena, hematochezia, anorexia, and hematemesis.  The patient denies the following symptoms: fever, weight loss, dysuria,hematuria, discharge,  jaundice, and dark urine.  The pain is worse with  lying supine, squatting & waist flexion.  The pain is better with defecation, passing gas & holding testicle.Trial of  Cymbalta 30 mg D/Ced after 5 days due to being "spacey"  .      The patient also presents with Palpitations.  The patient denies dizziness, presyncope, syncope, chest pain, shortness of breath, and throat tightness.  The patient denies the following symptoms: blurred vision, numbness, and weakness.  The palpitations are described as a sensation of   heart fluttering, but not raid.  The palpitations are intermittent, occur weekly, and occur at rest.  PMH of PAF.Flecanide as needed from Dr [name].  Current Medications (verified): 1)  Gabapentin 600 Mg Tabs (Gabapentin) .Marland Kitchen.. 1 By Mouth Three Times A Day 2)  Fluticasone Propionate 50 Mcg/act Susp (Fluticasone Propionate) .Marland Kitchen.. 1 Spray Two Times A Day As Needed 3)  Percocet (? Dose) .... 1/2 By Mouth Two Times A Day For Back Pain  Allergies (verified): 1)  ! Cymbalta (Duloxetine Hcl)  Review of  Systems Psych:  Denies anxiety and depression; Frustrated by events & persitance of symptoms.  Physical Exam  General:  in no acute distress; alert,appropriate and cooperative throughout examination Eyes:  No corneal or conjunctival inflammation noted. EOMI. Perrla. No lid lag Neck:  No deformities, masses, or tenderness noted. Lungs:  Normal respiratory effort, chest expands symmetrically. Lungs are clear to auscultation, no crackles or wheezes. Heart:  Normal rate and regular rhythm. S1 and S2 normal without gallop, murmur, click, rub .S4 Abdomen:  Bowel sounds positive,abdomen soft and non-tender without masses, organomegaly or hernias noted. Genitalia:  Testes bilaterally descended without nodularity, tenderness or masses. No scrotal masses or lesions. No penis lesions or urethral discharge. Pulses:  R and L carotid,radial,dorsalis pedis and posterior tibial pulses are full and equal bilaterally Extremities:  No clubbing, cyanosis; trace  edema. Decreased ROM of LE Neurologic:  strength normal in all extremities and DTRs symmetrical and normal.   Skin:  Intact without suspicious lesions or rashes Inguinal Nodes:  No significant adenopathy Psych:  memory intact for recent and remote, flat affect, and subdued.     Impression & Recommendations:  Problem # 1:  TESTICULAR PAIN, LEFT (ICD-608.9)  ? L -1 radicular pain vs GU etiology  Orders: Urology Referral (Urology) Specimen Handling ([address])  Problem # 2:  PALPITATIONS (ICD-785.1)  PMH of PAF  Orders: EKG w/ Interpretation (93000) Venipuncture ([address]) Specimen Handling (09811) TLB-TSH (Thyroid Stimulating  Hormone) (84443-TSH) TLB-Calcium (82310-CA) TLB-Potassium (K+) (84132-K) TLB-Magnesium (Mg) (83735-MG) TLB-T4 (Thyrox), Free [phone])  Complete Medication List: 1)  Gabapentin 600 Mg Tabs (Gabapentin) .Marland Kitchen.. 1 by mouth three times a day 2)  Fluticasone Propionate 50 Mcg/act Susp (Fluticasone propionate) .Marland Kitchen.. 1 spray  two times a day as needed 3)  Percocet (? Dose)  .... 1/2 by mouth two times a day for back pain  Patient Instructions: 1)  Consider Acupuncture for the testicular pain to help rule out  lumbar nerve etiology. Avoid stimulants as discussed to prevent palpitations.   Orders Added: 1)  Est. Patient Level IV [[address]] 2)  EKG w/ Interpretation [93000] 3)  Venipuncture [82956] 4)  Urology Referral [Urology] 5)  Specimen Handling [99000] 6)  TLB-TSH (Thyroid Stimulating Hormone) [84443-TSH] 7)  TLB-Calcium [82310-CA] 8)  TLB-Potassium (K+) [84132-K] 9)  TLB-Magnesium (Mg) [83735-MG] 10)  TLB-T4 (Thyrox), Free [21308-MV7Q]

## 2010-11-16 ENCOUNTER — Encounter: Payer: Self-pay | Admitting: Internal Medicine

## 2010-11-26 NOTE — Consult Note (Signed)
Summary: [facility]  [facility]   Imported By: Maryln Gottron 11/20/2010 09:56:14  _____________________________________________________________________  External Attachment:    Type:   Image     Comment:   External Document

## 2011-01-19 ENCOUNTER — Encounter: Payer: Self-pay | Admitting: Internal Medicine

## 2011-01-19 DIAGNOSIS — R911 Solitary pulmonary nodule: Secondary | ICD-10-CM | POA: Insufficient documentation

## 2011-01-20 ENCOUNTER — Ambulatory Visit (INDEPENDENT_AMBULATORY_CARE_PROVIDER_SITE_OTHER): Payer: BC Managed Care – PPO | Admitting: Internal Medicine

## 2011-01-20 ENCOUNTER — Encounter: Payer: Self-pay | Admitting: Internal Medicine

## 2011-01-20 DIAGNOSIS — M545 Low back pain, unspecified: Secondary | ICD-10-CM

## 2011-01-20 DIAGNOSIS — R1032 Left lower quadrant pain: Secondary | ICD-10-CM

## 2011-01-20 DIAGNOSIS — M546 Pain in thoracic spine: Secondary | ICD-10-CM

## 2011-01-20 DIAGNOSIS — M79605 Pain in left leg: Secondary | ICD-10-CM

## 2011-01-20 LAB — POCT URINALYSIS DIPSTICK
Bilirubin, UA: NEGATIVE
Blood, UA: NEGATIVE
Glucose, UA: NEGATIVE
Ketones, UA: NEGATIVE
Leukocytes, UA: NEGATIVE
Nitrite, UA: NEGATIVE
Protein, UA: NEGATIVE
Spec Grav, UA: 1.03
Urobilinogen, UA: 0.2
pH, UA: 5

## 2011-01-20 NOTE — Progress Notes (Signed)
Subjective:    Patient ID: [name], male    DOB: [birth year], 40 y.o.   MRN: [id number]  HPI BACK PAIN  Location: L LS area (also he has intermittent upper thoracic spine pain in the  Intra scapular area) Quality: variable as  toothache or burning Onset: 07/2010 Worse with: walking & standing long periods of time   Better with:passing gas post BM Radiation: L groin & L upper thigh as dull ache; this can occur w/o the back pain Trauma: no  Red Flags Fecal/urinary incontinence: no  Numbness/Weakness: yes, in L hip  Fever/chills/sweats: no  Night pain: yes, it never quits  Unexplained weight loss: no, weight up 10#  No relief with bedrest: no  h/o cancer/immunosuppression: no   PMH of  chronic steroid use: no, ESI X2 & steroid shot in elbows(2), L foot within past 6-12 months .      Review of Systems Urologic evaluation was negative in February of this year. He denies hematuria, dysuria or pyuria.   He does have left lower quadrant discomfort as noted. He denies melena; he has had intermittent rectal bleeding related to internal hemorrhoids. 2 polyps were removed by Dr. Yancey Flemings in 2011.  He recently returned from work after his January back surgery. He is more description has been dramatically limited.  He denies depression; he is worried about some other process such as" an aneurysm'.     Objective:   Physical Exam Gen.: Healthy and well-nourished in appearance. Alert and cooperative throughout exam. Affect is flat. Eyes: No corneal or conjunctival inflammation noted. No icterus.Mouth: Oral mucosa and oropharynx reveal no lesions, erythema  or exudates. Teeth in good repair. Lungs: Normal respiratory effort; chest expands symmetrically. Lungs are clear to auscultation without rales, wheezes, or increased work of breathing. Heart: Normal rate and rhythm. Normal S1 and S2. No gallop, click, or rub. No murmur. Abdomen: Bowel sounds normal; abdomen soft but  Slightly tender  LLQ. No masses, organomegaly or hernias noted. Musculoskeletal/extremities: No deformity or scoliosis noted of  the thoracic or lumbar spine. No clubbing, cyanosis, edema, or deformity noted. Range of motion  Normal especially @ the L hip .Tone & strength  normal.Joints normal. Nail health  good. Vascular: Carotid, radial artery pulses are full and equal. No bruits present.No AAA. Neurologic: Alert and oriented x3. Deep tendon reflexes symmetrical and normal. His gait is normal; he can walk on his toes and heels. No foot drop is noted. Strength is excellent.   Skin: Intact without suspicious lesions or rashes. Lymph: No cervical, axillary, or inguinal lymphadenopathy present. Psych: Mood and affect as noted.                                                                                        Assessment & Plan:  #1 atypical back, left lower quadrant, and thigh pain. The thigh discomfort could be related to an L1 or L 2 nerve impingement; the left lower quadrant discomfort would be related to a T 10- 11 nerve root impingement  most likely. An epidural steroid injection at L1 did not result in improvement.  Significantly he's had a colonoscopy  which did not reveal reveal any intracolonic  cause for the pain. Urologic evaluation has been unremarkable.  Plan: #1 CT scan of the abdomen and pelvis  #2 if this study is negative, I would recommend a trial of a TENS unit for the back.

## 2011-01-20 NOTE — Patient Instructions (Signed)
As we discussed, if the CAT scan of the abdomen and pelvis is negative, I would recommend a trial of a TENS unit for the pain.

## 2011-01-20 NOTE — Progress Notes (Signed)
Addended by: Stephan Minister on: 01/20/2011 05:00 PM   Modules accepted: Orders

## 2011-01-22 ENCOUNTER — Ambulatory Visit (INDEPENDENT_AMBULATORY_CARE_PROVIDER_SITE_OTHER)
Admission: RE | Admit: 2011-01-22 | Discharge: 2011-01-22 | Disposition: A | Discharge status: Home / Self Care | Payer: BC Managed Care – PPO | Source: Ambulatory Visit | Attending: Internal Medicine | Admitting: Internal Medicine

## 2011-01-22 DIAGNOSIS — M545 Low back pain, unspecified: Secondary | ICD-10-CM

## 2011-01-22 DIAGNOSIS — M79605 Pain in left leg: Secondary | ICD-10-CM

## 2011-01-22 DIAGNOSIS — R1032 Left lower quadrant pain: Secondary | ICD-10-CM

## 2011-01-22 MED ORDER — IOHEXOL 300 MG/ML  SOLN
100.0000 mL | Freq: Once | INTRAMUSCULAR | Status: AC | PRN
  Administered 2011-01-22: 100 mL via INTRAVENOUS

## 2011-01-26 ENCOUNTER — Telehealth: Payer: Self-pay | Admitting: Internal Medicine

## 2011-01-26 NOTE — Telephone Encounter (Signed)
Patient would like result of CT from [phone]

## 2011-01-26 NOTE — Telephone Encounter (Signed)
Will place on ledge for Dr. Alwyn Ren to review.

## 2011-01-26 NOTE — Telephone Encounter (Signed)
Please see my Appendum to results; i recommend trial of TENS Unit through PT for the chronic pain

## 2011-01-27 NOTE — Telephone Encounter (Signed)
I spoke with patient and he ok'd results and indicated he will consult with his back doctor and contact us back if he would like to consider PT. Patient is aware that he will need to call us in 3-4 months to set up chest film

## 2011-02-01 ENCOUNTER — Ambulatory Visit (INDEPENDENT_AMBULATORY_CARE_PROVIDER_SITE_OTHER): Payer: BC Managed Care – PPO | Admitting: Family Medicine

## 2011-02-01 VITALS — BP 130/80 | Temp 98.1°F | Wt 297.5 lb

## 2011-02-01 DIAGNOSIS — J329 Chronic sinusitis, unspecified: Secondary | ICD-10-CM

## 2011-02-01 MED ORDER — AMOXICILLIN-POT CLAVULANATE 875-125 MG PO TABS: 1.0000 | ORAL_TABLET | Freq: Two times a day (BID) | ORAL | Status: AC

## 2011-02-01 NOTE — Progress Notes (Signed)
  Subjective:    Patient ID: [name], male    DOB: 09-Jul-1971, 40 y.o.   MRN: [id number]  HPI ? Sinus infxn- sxs started 4 days ago w/ nasal congestion, facial pain.  L sided tooth pain.  L ear pain.  + HA.  No fever.  Mild cough.  Using netti pot and using Flonase + antihistamine w/out relief.  No known sick contacts.  Pt reports hx of recurrent sinus infxns, ~3x/yrs.   Review of Systems For ROS see HPI     Objective:   Physical Exam  Constitutional: He appears well-developed and well-nourished. No distress.  HENT:  Head: Normocephalic and atraumatic.  Right Ear: Tympanic membrane normal.  Left Ear: Tympanic membrane normal.  Nose: Mucosal edema and rhinorrhea present. Right sinus exhibits maxillary sinus tenderness and frontal sinus tenderness. Left sinus exhibits maxillary sinus tenderness and frontal sinus tenderness.  Mouth/Throat: Mucous membranes are normal. Oropharyngeal exudate and posterior oropharyngeal erythema present. No posterior oropharyngeal edema.       + PND ~0.5 cm aphthous ulcer on inside of L lower lip  Eyes: Conjunctivae and EOM are normal. Pupils are equal, round, and reactive to light.  Neck: Normal range of motion. Neck supple.  Cardiovascular: Normal rate, regular rhythm and normal heart sounds.   Pulmonary/Chest: Effort normal and breath sounds normal. No respiratory distress. He has no wheezes.       + hacking cough  Lymphadenopathy:    He has no cervical adenopathy.  Skin: Skin is warm and dry.          Assessment & Plan:

## 2011-02-01 NOTE — Assessment & Plan Note (Signed)
Pt's sxs and PE consistent w/ sinus infxn.  Pt reports that augmentin typically works best.  Start abx.  Reviewed supportive care and red flags that should prompt return.  Pt expressed understanding and is in agreement w/ plan.

## 2011-02-01 NOTE — Patient Instructions (Signed)
This appears to be a sinus infection Take the Augmentin as directed- take w/ food to avoid upset stomach Use Robitussin or Delsym as needed for cough Add Mucinex (generic works just as well) to thin your congestion Drink plenty of fluids Add Ambusol, Oragel, or other pain reliever for your canker sore Call with any questions or concerns Hang in there!!

## 2011-02-02 NOTE — Assessment & Plan Note (Signed)
[facility]                                 ON-CALL NOTE   NAME:[name], [name]                          MRN:          [id number]  DATE:01/11/2009                            DOB:          1971-02-28    Patient of Dr. [name].   [name] called to report recurrent atrial fibrillation.  He estimates  his heart rate to be approximately 130.  He has no risk factors for  thromboembolism.  He has been in his arrhythmia for 12 hours.  He took a  dose of 300 mg of flecainide without benefit.  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  I advised him that  he should not take another dose of flecainide within 24 hours of the  previous dose.  I suggested that he be seen in the emergency department  of [facility] tomorrow morning.  If he does not get back to town in  time, I suggested that he take another dose of flecainide and call Dr.  [name] on Monday.  We discussed the use of weight control medication.  He  does not feel that he requires that at the present time, but will call  back if he becomes symptomatic.     Gerrit Friends. [name], MD, [facility]  Electronically Signed    RMR/MedQ  DD: 01/11/2009  DT: 01/12/2009  Job #: [id number]

## 2011-02-02 NOTE — Consult Note (Signed)
[name], [name]                 ACCOUNT NO.:  [id number]   MEDICAL RECORD NO.:  [id number]          PATIENT TYPE:  EMS   LOCATION:  [facility]                         FACILITY:  [facility]   PHYSICIAN:  Bevelyn Buckles. Bensimhon, MDDATE OF BIRTH:  08-01-1971   DATE OF CONSULTATION:  08/27/2007  DATE OF DISCHARGE:                                 CONSULTATION   PRIMARY CARDIOLOGIST:  Dr. Valera Castle.   PRIMARY CARE Giovonnie Trettel:  Dr. Alwyn Ren.   PATIENT PROFILE:  40 year old Caucasian male with long history of  paroxysmal atrial fibrillation who has not been taking his verapamil and  aspirin, who presents with less than 48-hour history of recurrent AFib.   PROBLEM LIST:  1. Paroxysmal atrial fibrillation.  2. Hyperlipidemia.  3. Obstructive sleep apnea on CPAP.  4. History of Arnold-Chiari malformation, status post craniotomy 2003.  5. History of chronic headaches, improved following craniotomy.  6. Ongoing tobacco abuse.  7. History of medication nonadherence.   HISTORY OF PRESENT ILLNESS:  40 year old married Caucasian male with  history of paroxysmal atrial fibrillation, previously managed with  flecainide and then subsequently with calcium channel-blockers, most  recently verapamil, for which he only takes p.r.n., as he feels as  though it causes him to be lightheaded when he takes it daily.  On  December 5th, at approximately 3 p.m., he noted tachy palpitations with  rates in the 120s to 130s.  He took a  verapamil 120 mg with reduction  in heart rate into the 80s, but continues to note irregular palpitations  and a decrease in exercise tolerance with fatigue with nearly any  activity.  He denied any chest pain or shortness breath.  He took  another 120 of verapamil at 4:30 a.m. on December 6th, and then called  me at around 4:00 p.m., and I asked him to take an additional verapamil  yesterday afternoon.  As he did not convert last night or this morning  after taking another dose of verapamil  at about 4:00 a.m., he presented  to the [facility] ED.  Currently, he rates it between 90s and one-teens,  and he feels okay at rest, but notes that he has had decreased exercise  tolerance over the past 2 days.   ALLERGIES:  No known drug allergies.   HOME MEDICATIONS:  He is prescribed:  1. Verapamil 120 mg daily.  2. Aspirin 325 mg daily, which he has not been taking.   FAMILY HISTORY:  Mother has a history of hypertension.  Father has a  history of CAD, status post CABG at age 31.   SOCIAL HISTORY:  Lives in [place] with his wife and 3 children.  He  works as a Retail banker for [employer].  He has a 20  pack/year history tobacco abuse which is ongoing at one pack a day.  Denies any alcohol or drug use, and is not routinely exercising.   REVIEW OF SYSTEMS:  Positive for tachy palpitations as well as fatigue  and decreased exercise tolerance.  Otherwise, all systems reviewed and  negative.   PHYSICAL EXAM:  Temperature 98.3; heart rate initially 115, now 91;  respirations 18; blood pressure 122/80; pulse ox 97% on room air.  Pleasant white male in no acute distress.  Awake, alert and oriented x3.  NECK:  No bruits, JVD.  LUNGS:  Respirations regular, unlabored.  Clear to auscultation.  CARDIAC:  Irregularly irregular; S1, S2.  No S3, S4, murmurs.  ABDOMEN:  Round, soft, nontender, nondistended.  Bowel sounds present  x4.  EXTREMITIES:  Warm, dry, pink.  No clubbing, cyanosis or edema.  Dorsalis pedis,  posterior tibial pulses 2+ bilaterally.   ACCESSORY CLINICAL FINDINGS:  EKG shows AFib, rate of 102.  No acute ST-  T changes.   LABORATORY DATA:  Hemoglobin 17, hematocrit 50, sodium 137, potassium 5,  chloride 106, CO2 29.1, BUN 18, creatinine 1.3, glucose 103.  CK-MB less  than 1, troponin I less than 0.5.   ASSESSMENT AND PLAN:  1. Paroxysmal atrial fibrillation less than 48 hours since onset at 3      p.m. on December 5th.  He still in atrial  fibrillation and rate is      in the 90s to one-teens, and is currently asymptomatic, but      develops easy fatigability with activities.  I have discussed with      Dr. Gala Romney, and we have successfully performed DC cardioversion      with restoration of sinus rhythm.  See cardioversion note for      complete details.  We have advised the patient to resume his daily      verapamil as well as full-strength aspirin, and we will arrange for      followup with Dr. [name] to further discuss possible resumption of      anti-arrhythmic therapy.  2. Tobacco abuse.  Smoking cessation strongly advised.      [name], [name]      Bevelyn Buckles. Bensimhon, MD  Electronically Signed    CB/MEDQ  D:  08/27/2007  T:  08/28/2007  Job:  [id number]

## 2011-02-02 NOTE — Assessment & Plan Note (Signed)
[facility]                         ELECTROPHYSIOLOGY OFFICE NOTE   NAME:[name], [name]                        MRN:          [id number]  DATE:09/19/2007                            DOB:          1970-12-11    [name] is seen at the request of Dr. Gala Romney following a visit to  the emergency room in early December.  He had recurrence of his  paroxysmal atrial fibrillation that is relatively infrequent.  The  episode did not terminate by itself after about 12 hours so he was told  to come to the emergency room the following day if it had not  terminated, so at 36 hours he presented.  He was in atrial fibrillation.  He was cardioverted.   Apparently, by his history, but by records which we do not have, I had  given him IC therapy has a cocktail in the past and this had been  effective.   He has tried verapamil to take on a regular or on an as-needed basis; he  has tolerated neither.   He is having no problems with chest pain or shortness of breath and no  exercise intolerance.   He does smoke.   He is currently taking aspirin.  He does not take the verapamil.   ALLERGIES:  He has no known drug allergies.   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   PHYSICAL EXAMINATION:  VITAL SIGNS:  On examination today, [name]  weight is 278 pounds, up 15 pounds in the last year.  His blood pressure  is 136/73.  His pulse was 81.  NECK:  His neck veins are flat.  His carotids are brisk.  LUNGS:  Clear.  HEART:  Heart sounds were regular without murmurs or gallops.  ABDOMEN:  Soft with active bowel sounds.  EXTREMITIES:  Without clubbing, cyanosis or edema.  NEUROLOGIC:  Exam was grossly normal.  SKIN:  Warm and dry.   Electrocardiogram dated today demonstrated sinus  rhythm at 81 with  intervals of 0.12/0.10/0.34, the axis was 75 degrees.  Mild ST-T changes  were evident in the inferior leads, which is old.   IMPRESSION:  1. Paroxysmal atrial fibrillation.  2. Obesity.  3. Previously normal left ventricular function.  4. Ongoing cigarette use.   [name] would like to stop smoking.  I suggest that he get with Dr.  Alwyn Ren to discuss treatment options.  He is going to try to discuss with  his wife also just stopping abruptly on September 21, 2007, and I have  given the 1-800-STOP NOW number.   As relates to his atrial fibrillation, I given him a prescription for  flecainide 300 mg to take if the episode persists for more than six  hours.  He is to take it at night.  He is to call us in the following  day if his atrial fibrillation has not  reverted to sinus rhythm.   I have given him a total of six pills.  I will see him again in one  year's time.     [name], MD, [facility]  Electronically Signed    SCK/MedQ  DD: 09/19/2007  DT: 09/19/2007  Job #: [id number]

## 2011-02-02 NOTE — Assessment & Plan Note (Signed)
[facility]                                 ON-CALL NOTE   NAME:SHOEAston, Lieske                        MRN:          [id number]  DATE:08/26/2007                            DOB:          01/03/1971    Mr. Klemens is a patient of Dr. Vern Claude. Mr. Klemann called in this afternoon  because he noted since 3pm yesterday that he has been in atrial  fibrillation. He does have a history of paroxysmal atrial fibrillation,  previously treated with Verapamil and aspirin. He has not been taking  his Verapamil or his aspirin because he says that he gets light-  headedness when he takes his Verapamil on a daily basis.   He noted irregularity and tachycardia yesterday around 3pm. He had a  rate of about 120 beats-per-minute with a normal blood pressure. He took  120 mg of Verapamil at that point, and then took another 120 mg this  morning at about 4am. He says that his rate is now in the 80s, but he is  still irregular and is asking what to do.   I advised that he go ahead and take another 120 mg of Verapamil now  provided that his systolic pressure is greater than 100. Also, I  recommended that he take a full strength aspirin on a daily basis  regardless of whether or not he plans to take his Verapamil. If he  remains in atrial fibrillation or becomes uncomfortable between now and  tomorrow morning, then he should come into the emergency room for  further evaluation in the AM or sooner if necessary. He plans on taking  another Verapamil this afternoon, as well as aspirin, and will give [place] a  call back if he needs [place] or just come into the emergency department.     Nicolasa Ducking, ANP  Electronically Signed    CB/MedQ  DD: 08/26/2007  DT: 08/27/2007  Job #: [id number]

## 2011-02-02 NOTE — Discharge Summary (Signed)
NAMEFREMONT, SKALICKY                 ACCOUNT NO.:  [id number]   MEDICAL RECORD NO.:  [id number]          PATIENT TYPE:  EMS   LOCATION:  [facility]JO                         FACILITY:  [facility]   PHYSICIAN:  Bevelyn Buckles. Bensimhon, MDDATE OF BIRTH:  09-Sep-1971   DATE OF ADMISSION:  08/27/2007  DATE OF DISCHARGE:                         DISCHARGE SUMMARY - REFERRING   PATIENT IDENTIFICATION:  Mr. [name] is a 40 year old male with paroxysmal  atrial fibrillation who was brought to the ER with recurrent symptomatic  atrial fibrillation of duration of less than 48 hours.  He was given a  dose of subcutaneous Lovenox.  Anesthesia was consulted, and he was  sedated with 350 mg of Sodium Pentothal by Dr. Jacklynn Bue.  After  appropriate sedation he received a single 200 joule biphasic shock with  prompt conversion to normal sinus rhythm.  There were no apparent  complications.  He will follow up with Dr. [name] as an outpatient to  discuss possible antiarrhythmic therapy.      Bevelyn Buckles. Bensimhon, MD  Electronically Signed     DRB/MEDQ  D:  08/27/2007  T:  08/27/2007  Job:  [id number]

## 2011-02-05 NOTE — H&P (Signed)
[place]  Patient:    [name], [name]                        MRN: [id number] Adm. Date:  [id number] Attending:  Barton Fanny                         History and Physical  HISTORY OF PRESENT ILLNESS:  Patient is a 40 year old right-handed white male whom I have followed for the past year-and-a-half for a Chiari malformation. He has a longstanding history of headaches dating back eight years.  He has been treated in a number of headache centers, including the [facility] and at [facility] with ______.  He greatest complaint is of headaches at the craniocervical junction as well as frontal headaches.  He has been having more and more neck discomfort associated with the headaches with some discomfort extending down to the shoulders and arms.  He finds that sneezing will cause pain from his neck running down to the shoulders and arms bilaterally.  Patient is evaluated with MRI scan that has revealed a Chiari malformation and because of the possibility that this is contributing to his headache condition, he is admitted now for decompression of the Chiari malformation via suboccipital craniectomy and upper cervical laminectomy and duraplasty.  PAST MEDICAL HISTORY:  Notable for a history of atrial fibrillation for which he is followed at [facility].  His current medical regimen is Tambocor 100 mg q.12h. and Toprol-XL 50 mg q.d.  He has been treated with cardioversions in the past and a number of different antiarrhythmics, but currently the Tambocor is controlling it well.  He also has a history of hiatal hernia but has no history of hypertension, myocardial infarction, cancer, stroke, diabetes, peptic ulcer disease, or lung disease.  PREVIOUS SURGERY:  None.  ALLERGIES:  No known allergies.  MEDICATIONS AT THIS TIME: 1. Toprol-XL 50 mg q.d. 2. Tambocor 100 mg q.12h. 3.  Indocin 25 mg tablets one or two q.d.  FAMILY HISTORY:  Mother is in good health.  His father is in poor health - he has heart disease and diabetes with nerve damage, and arthritis.  There is a family history of heart disease, diabetes, cancer, and liver problems.  SOCIAL HISTORY:  Patient works as a Retail banker at [employer].  He is married.  He does not smoke, he does not drink alcoholic beverages.  He denies a history of substance abuse.  REVIEW OF SYSTEMS:  Notable for those difficulties described in his history of present illness and past medical history but is otherwise unremarkable.  PHYSICAL EXAMINATION:  GENERAL:  Patient is a well-developed, well-nourished white male in no acute distress.  VITAL SIGNS:  Temperature 98.9, pulse 64, blood pressure 120/72, respiratory rate 16.  Height 6 feet 4 inches, weight 250 pounds.  LUNGS:  Clear to auscultation.  He has symmetrical respiratory excursion.  HEART:  Has a regular rate and rhythm, normal S1 and S2.  There is no murmur.  ABDOMEN:  Soft, nondistended.  Bowel sounds are present.  MUSCULOSKELETAL:  Shows no significant tenderness to palpation over the cervical spinous process or paracervical musculature.  He has a fairly good range of motion of the neck.  NEUROLOGIC:  Shows 5/5 strength throughout the upper and lower extremities including the deltoid, biceps, triceps, intrinsics, and grip.  Sensation is intact to  pinprick throughout the upper extremities.  Reflexes are minimal in the biceps, brachioradialis, and triceps.  He has 2+ reflexes in the quadriceps and gastrocnemii that is symmetrical bilaterally.  Toes are downgoing bilaterally.  He has a normal gait and stance.  IMPRESSION:  Patient with chronic neck pain and headaches with a Chiari malformation and mild degenerative changes at the C3-4 level.  He is neurologically intact.  PLAN:  The patient to be admitted for a Chiari decompression via a  posterior suboccipital craniectomy and a C1 and possible C2 laminectomy with duraplasty with dural allograft.  We have discussed the uncertainty of whether surgery will relieve his headache condition; typical length of surgery, hospital stay, and overall recuperation; his risks of surgery including risk of infection, bleeding, possible need for transfusion; the risk of neurologic dysfunction including stroke, paralysis, coma, or death; and the anesthetic risks of myocardial infarction, stroke, pneumonia, or death.  Understanding all this he does wish to proceed with surgery and is admitted for such. DD:  11/30/00 TD:  11/30/00 Job: [id number]

## 2011-02-05 NOTE — Discharge Summary (Signed)
[place]. [facility]  Patient:    [name], [name]                        MRN: [id number] Adm. Date:  [id number] Disc. Date: [id number] Attending:  Barton Fanny                           Discharge Summary  ADMISSION HISTORY AND PHYSICAL EXAMINATION:  The patient is 40 year old man with a long-standing history of headaches.  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  He had undergone treatment of his headaches at a number of medical centers.  The headache was typically worse in the craniocervical junction, but he also had frontal headaches.  He found them increasingly uncomfortable and in capacitating.  He also had a notable history of atrial fibrillation, for which he is followed by [facility] and for which he takes Tambocor 100 mg q.12h. and Toprol XL 50 mg q.d.  GENERAL EXAMINATION:  Unremarkable.  NEUROLOGIC EXAMINATION:  Intact strength and sensation.  HOSPITAL COURSE:  The patient was admitted and underwent suboccipital craniectomy in C1 and partial C2 cervical laminectomy and duraplasty with ______ .  The patient tolerated the surgery well.  Postoperatively, he had some hypercarbia, however, this improved.  We were able to discontinue his lines and begin to ambulate him.  His wound healed well and postoperatively and neurologically he was intact.  He was discharged to home with instructions regarding wound care and activities.  He was given a prescription for Percocet.  DISCHARGE DIAGNOSES: 1. Chiari malformation. 2. Headaches.  DD:  01/11/01 TD:  01/12/01 Job: [id number]

## 2011-02-05 NOTE — Assessment & Plan Note (Signed)
[facility]                              CARDIOLOGY OFFICE NOTE   NAME:SHOEChandlar, Guice                        MRN:          [id number]  DATE:04/29/2006                            DOB:          07/14/1971    REASON FOR VISIT:  Ms. Flemmer returns today for paroxysmal atrial  fibrillation.  He was seen in the emergency room with atrial fibrillation by  our team on April 20, 2006.  He had run out of his verapamil.   He has had a very quiescent course with this despite not taking this drug.   His labs were within normal limits.  TSH was normal.   He was started back on verapamil 120, and he is on aspirin 325 mg daily.   He has had no recurrences.   PHYSICAL EXAMINATION:  VITAL SIGNS:  His blood pressure today is 134/68,  pulse 77 and regular.  His weight is 263.  NECK:  Carotids are full without bruits.  No JVD.  Thyroid is not enlarged.  LUNGS:  Clear.  HEART:  Regular rate and rhythm.  ABDOMEN:  Soft with good bowel sounds.  EXTREMITIES:  No edema.  Pulses are intact.   DIAGNOSTIC STUDIES:  EKG shows sinus rhythm today.   ASSESSMENT AND PLAN:  Mr. Poplaski is doing well.  We will continue with his  current program.  I will plan on seeing him back in six months.                               Thomas C. Daleen Squibb, MD, [facility]    TCW/MedQ  DD:  04/29/2006  DT:  04/29/2006  Job #:  [id number]

## 2011-02-05 NOTE — Op Note (Signed)
[place]  Patient:    [name], [name]                        MRN: [id number] Proc. Date: 11/30/00 Adm. Date:  [id number] Attending:  Barton Fanny                           Operative Report  PREOPERATIVE DIAGNOSIS:  Chiari malformation.  POSTOPERATIVE DIAGNOSIS:  Chiari malformation.  OPERATION PERFORMED:  Suboccipital craniectomy, C1 and partial C2 cervical laminectomies.  Duraplasty with Duragard.  SURGEON:  Hewitt Shorts, M.D.  ASSISTANT: 1. Mena Goes. Franky Macho, M.D. 2. Stefani Dama, M.D.  ANESTHESIA:  General endotracheal.  INDICATIONS FOR PROCEDURE:  The patient is a 40 year old man who presented with headaches, who has undergone extensive treatment for those over the years.  He was found by MRI scan to have a Chiari malformation.  Decision was made to proceed with decompression of the Chiari malformation.  DESCRIPTION OF PROCEDURE:  The patient was brought to the operating room and placed under general endotracheal anesthesia. The patient had a three-pin Mayfield head holder applied.  The patient was turned to a prone position and the occipital scalp was shaved and the occiput and posterior neck were prepped with Betadine soap and solution and draped in a sterile fashion.  A midline incision was made over the occiput and carried down over the posterior cervical midline.  The line of incision was  infiltrated with local anesthetic with epinephrine.  The dissection was carried down to the midline raphe.  The inion was identified and dissection was carried down.  Self-retaining retractors were placed and we were able to dissect down to the C2 spinous process and identify the posterior ring of C1.  Dissection was carried laterally.  Self-retaining retractor was placed and then we proceeded with a suboccipital craniectomy using the [facility] Max drill and double action rongeurs and Kerrison punches.  The foramen magnum was  carefully drilled away and the last bits of it were removed using a small 2 mm Kerrison punch.  There was moderate fibrosis in the interlaminar space between the foramen magnum and C1 as well as between the lamina of C1 and C2.  The superior aspect of C2 was removed as well.  The edges of bone were waxed as necessary.  We removed the thickened fibrosis down to the dura which was opened in a Y-shaped fashion from the midline over the spinal cord in the Y fashion to either side and the posterior fossa over the cerebellar hemispheres and tonsils.  The edges of the dura were coagulated as needed and then we selected a 4 x 4 cm patch of DuraGard.  It was cut in a triangular shape and sutured with interrupted and running 4-0 Nurolon sutures.  A good watertight closure was achieved.  We then closed the wound in multiple layers, muscle and fascial tissues being closed with interrupted 0 undyed Vicryl sutures and the subcutaneous and subcuticular layer being closed using inverted 2-0 undyed Vicryl sutures and the skin edges closed with surgical staples.  The wound was dressed with Adaptic and sterile gauze.  The patient tolerated the procedure well.  Estimated blood loss for this procedure was 400 cc.  Sponge, needle and instrument counts were correct. Following surgery, the patient was turned back to supine position and the three-pin Mayfield head holder was removed.  He was reversed  from anesthetic to be extubated and was transferred to the recovery room for further care to be subsequently transferred to the neurosurgical intensive care unit. DD:  11/30/00 TD:  11/30/00 Job: [id number]

## 2011-02-05 NOTE — Consult Note (Signed)
NAMEYAMIN, SWINGLER                 ACCOUNT NO.:  [id number]   MEDICAL RECORD NO.:  [id number]          PATIENT TYPE:  EMS   LOCATION:  MAJO                         FACILITY:  [facility]   PHYSICIAN:  Charlton Haws, M.D.     DATE OF BIRTH:  April 27, 1971   DATE OF CONSULTATION:  DATE OF DISCHARGE:  04/20/2006                                   CONSULTATION   DATE OF BIRTH:  26-Jan-1971   PRIMARY CARDIOLOGIST:  Jesse Sans. Wall, M.D.   PRIMARY CARE PHYSICIAN:  Titus Dubin. Alwyn Ren, M.D. [facility]   CHIEF COMPLAINT:  Palpitations.   HISTORY OF PRESENT ILLNESS:  Mr. Ardis is a very pleasant 40 year old male  patient who has been seen by Dr. Daleen Squibb, as well as Dr. [name] in the past, as  well as Dr. Andee Lineman.  He has a history of paroxysmal atrial fibrillation and  has been tried on several antiarrhythmics, including Rythmol.  He is  somewhat noncompliant with his medications.  He is suppose to be on  verapamil 120 mg a day, but has not been taking that recently.  He developed  palpitations on Monday at 5:30 p.m.  This is usually what he experiences  when he has atrial fibrillation.  He called the office on Tuesday.  They  started him back on his verapamil 120 mg a day and prescribed diltiazem 60  mg 4 times a day p.r.n. palpitations.  He took a couple of doses of  diltiazem yesterday, as well as his verapamil.  He took another dose of  diltiazem this morning and then his verapamil.  He called the office today,  which is Wednesday, with continuing palpitations and was directed to come to  the emergency room.  Upon presentation to the emergency room, his EKG  revealed atrial fibrillation with a heart rate of 106.  He shortly  thereafter converted to normal sinus rhythm with a heart rate in the 70s.  He is currently feeling well without any complaints of chest pain or  shortness of breath.  The only complaint he did have with his atrial  fibrillation was fatigue, but that has resolved.   PAST MEDICAL  HISTORY:  Significant for:  1.  Paroxysmal atrial fibrillation.  2.  Dyslipidemia.  3.  Sleep apnea.  He is on CPAP.  4.  He has a history of Arnold-Chiari malformation and is status post      craniotomy in 2003.  5.  Denies any history of diabetes mellitus, hypertension, or stroke.   MEDICATIONS:  He is currently not taking any medications, but is suppose to  be on verapamil ER 120 mg a day, diltiazem 60 mg 4 times a day p.r.n., and  aspirin 325 mg daily.   ALLERGIES:  NO KNOWN DRUG ALLERGIES.   SOCIAL HISTORY:  The patient lives with his wife and 3 children.  He is a  Retail banker for [employer].  He continues to smoke  cigarettes at 15 pack years and has no history of alcohol abuse.   FAMILY HISTORY:  Significant for hypertension in his  mother.  His father has  coronary disease and is status post CABG at age 60.   REVIEW OF SYSTEMS:  Please see HPI.  Denies any fever, chills, melena,  hematochezia, hematuria, dysuria, hemoptysis, dysphagia, odynophagia.  He  has chronic arthralgias.  He noted some fatigue with his atrial  fibrillation, but that has resolved.  He denies chronic headache, but did  have a headache about 3 days prior to his palpitations beginning.  The rest  of review of systems are negative.   PHYSICAL EXAMINATION:  GENERAL:  He is well-nourished, well-developed in no  acute distress.  VITAL SIGNS:  Blood pressure 123/82, pulse is 81, respirations 18,  temperature 98.4.  HEENT:  Head normocephalic, atraumatic.  Eyes:  PERRLA.  EOMI.  Sclerae  clear.  Oropharynx pink without exudate.  NECK:  Without lymphadenopathy.  No thyromegaly.  Carotids without bruits  bilaterally.  CARDIAC:  Normal S1 and S2.  Regular rate and rhythm without murmurs,  clicks, rubs, or gallops.  LUNGS:  Clear to auscultation bilaterally without wheezing, rhonchi, or  rales.  SKIN:  Without rashes.  ABDOMEN:  Soft, nontender.  No hepatosplenomegaly or organomegaly.  No   rebound or guarding.  EXTREMITIES:  Without clubbing, cyanosis, or edema.  MUSCULOSKELETAL:  Without joint deformity.  NEUROLOGIC:  Alert and oriented x3.  Cranial nerves II-XII are grossly  intact.  Strength is 5/5 in all extremities.  There are 2+ dorsalis pedis  and posterior tibial pulses bilaterally.   Electrocardiogram on presentation shows atrial fibrillation with a heart  rate of 106, normal axis, nonspecific ST-T wave changes.  The second EKG  shows normal sinus rhythm with a heart rate of 71.  Nonspecific ST-T wave  changes.   IMPRESSION:  1.  Paroxysmal atrial fibrillation - currently normal sinus rhythm.  2.  Chronic headaches.  3.  Status post Arnold-Chiari malformation with craniotomy in 2003.  4.  Sleep apnea on CPAP therapy.  5.  Dyslipidemia.   PLAN:  The patient was also seen and examined by Dr. Eden Emms.  He is  currently back in normal sinus rhythm and has no complaint.  Will go ahead  and check a BMP, CBC, and TSH in the emergency room and send him home.  The  patient will follow up with Dr. Daleen Squibb on April 29, 2006, at 11:45 a.m.  We  have asked him to ahead and start back on verapamil 120 mg daily and  continue on coated aspirin 325 mg daily.  He knows to contact us if he has  any recurrent palpitations.      Tereso Newcomer, P.A.    ______________________________  Charlton Haws, M.D.    SW/MEDQ  D:  04/20/2006  T:  04/20/2006  Job:  [id number]   cc:   Titus Dubin. Alwyn Ren, M.D. [facility]

## 2011-02-05 NOTE — Assessment & Plan Note (Signed)
[facility]                                 ON-CALL NOTE   NAME:[name], [name]                       MRN:          [id number]  DATE:05/11/2008                            DOB:          04/30/1971    SUPERVISING PHYSICIAN:  [name], MD, [name].   PRIMARY CARDIOLOGIST:  [name], MD, [name].   SUMMARY AND HISTORY:  [name] is a 40 year old male with a history of  atrial fibrillation.  He states that when he woke up this morning he  felt like he was in atrial fibrillation.  He describes a rapid heart  rate with generalized weakness.  He wants clarification on instructions  in regards to his flecainide.  He states that he took 1 of the tablets  this morning, but he remains in atrial fibrillation.   According to Dr. [name] note dated September 19, 2007, Dr. [name] gave him  a prescription for flecainide 300 mg and instructed the patient if it  persisted from more than 6 hours he was to take 300 mg of flecainide.  On reviewing with the patient, he was not clear the dosage of the  flecainide that he had.  He states that he did have 3 tablets, but he  thought they were 100 mg and he was not sure on how to take and he had  already taken 1 earlier this morning.  He states that there were no  specific instructions on the bottle, but there was 1 refill.  I asked  him since he was essentially asymptomatic in regards to atrial  fibrillation to return to the pharmacy to confirm the dosing of the  flecainide.  I advised him if the flecainide was in fact 100 mg in each  tablet, he should take the remaining 2 tablets.  If he did not and to  call me back later this afternoon to see if he converted back to normal  sinus rhythm or if he remained in atrial fibrillation.   I spoke with him approximately at 2:30, he did follow my instructions as  previously, and he states that he has reverted to normal sinus rhythm  and he feels fine.  He states his blood  pressure was approximately  120/70 and his heart rate is now down in the 80's versus in the 120's.  He did obtain a refill of the flecainide and I did review the  instructions of Dr. [name] had informed him earlier in September 19, 2007.  I advised him if he had any further problems to feel free to call us.     Joellyn Rued, PA-C  Electronically Signed      [name], MD, [name]  Electronically Signed   EW/MedQ  DD: 05/11/2008  DT: 05/12/2008  Job #: [id number]   cc:   [name], MD, [name]

## 2011-02-05 NOTE — Procedures (Signed)
[name], [name]                 ACCOUNT NO.:  [id number]   MEDICAL RECORD NO.:  [id number]          PATIENT TYPE:  OUT   LOCATION:  SLEEP CENTER                 FACILITY:  [facility]   PHYSICIAN:  Marcelyn Bruins, M.D. [facility] DATE OF BIRTH:  1971-05-16   DATE OF STUDY:  11/16/2005                              NOCTURNAL POLYSOMNOGRAM   REFERRING PHYSICIAN:  Dr. Lona Kettle.   DATE OF SURGERY:  November 16, 2005.   INDICATION FOR STUDY:  Hypersomnia with sleep apnea.   EPWORTH SLEEPINESS SCORE:  8.   SLEEP ARCHITECTURE:  The patient had a total sleep time of 355 minutes with  decreased slow wave sleep as well as REM. Sleep onset latency was normal and  REM onset was prolonged at 181 minutes. Sleep efficiency was 93%.   RESPIRATORY DATA:  The patient underwent a split night study where he was  found to have 177 obstructive events in the first 140 minutes of sleep. This  gave the patient a respiratory disturbance index of 76 events per hour.  Events were not positional but very loud snoring was noted throughout. By  protocol, the patient was then placed on a large ResMed Ultra-Mirage full-  face mask and C-PAP titration was initiated. With a final pressure of 15 cm  of water, there was excellent control of the patient's obstructive events.   OXYGEN DATA:  There was O2 desaturation as low as 85% with the patient's  obstructive events.   CARDIAC DATA:  No clinically significant cardiac arrhythmias.   MOVEMENTS/PARASOMNIA:  The patient was found to have 88 leg jerks with  approximately 1 to 2 per hour that resulted in arousal or awakening.   IMPRESSION/RECOMMENDATIONS:  1.  Severe obstructive sleep apnea/hypopnea syndrome with a respiratory      disturbance index of 76 events per hour in the first half of the night.      The patient was then placed on C-PAP and titrated ultimately to a level      of 15 cm of water pressure with excellent control of his events. A large      ResMed  Ultra-Mirage full face mask was used for the titration.  2.  Moderate numbers of leg jerks with very little sleep disruption. I      suspect these are due to the patient's sleep      disordered breathing, however, clinical correlation is suggested if he      fails to respond appropriately to optimal C-PAP.           ______________________________  Marcelyn Bruins, M.D. [facility]  Diplomate, American Board of Sleep  Medicine     KC/MEDQ  D:  11/25/2005 15:12:43  T:  11/25/2005 23:58:18  Job:  [id number]

## 2011-03-12 ENCOUNTER — Encounter: Payer: Self-pay | Admitting: *Deleted

## 2011-03-15 ENCOUNTER — Ambulatory Visit (INDEPENDENT_AMBULATORY_CARE_PROVIDER_SITE_OTHER): Payer: BC Managed Care – PPO | Admitting: Pulmonary Disease

## 2011-03-15 ENCOUNTER — Encounter: Payer: Self-pay | Admitting: Pulmonary Disease

## 2011-03-15 VITALS — BP 134/80 | HR 75 | Temp 98.6°F | Ht 75.0 in | Wt 300.8 lb

## 2011-03-15 DIAGNOSIS — G4733 Obstructive sleep apnea (adult) (pediatric): Secondary | ICD-10-CM

## 2011-03-15 NOTE — Assessment & Plan Note (Signed)
The pt is wearing cpap compliantly, and feels that he sleeps reasonably well with adequate daytime alertness.  He is due for a new machine, and will go ahead and order this.  I have encouraged him to work aggressively on weight loss.

## 2011-03-15 NOTE — Progress Notes (Signed)
  Subjective:    Patient ID: [name], male    DOB: 05/09/1971, 40 y.o.   MRN: [id number]  HPI The pt comes in today for f/u of his known osa.  He is wearing cpap compliantly, and has been keeping up with mask changes and supplies.  His machine is 5-6 y/o, and is acting funny at times. It is due for replacement.  He feels he sleeps well with the device, but his back issues can keep him awake.  He denies significant daytime sleepiness.    Review of Systems  Constitutional: Negative for fever and unexpected weight change.  HENT: Negative for ear pain, nosebleeds, congestion, sore throat, rhinorrhea, sneezing, trouble swallowing, dental problem, postnasal drip and sinus pressure.   Eyes: Positive for redness and itching.  Respiratory: Negative for cough, chest tightness, shortness of breath and wheezing.   Cardiovascular: Positive for palpitations. Negative for leg swelling.  Gastrointestinal: Negative for nausea and vomiting.  Genitourinary: Negative for dysuria.  Musculoskeletal: Negative for joint swelling.  Skin: Negative for rash.  Neurological: Positive for headaches.  Hematological: Does not bruise/bleed easily.  Psychiatric/Behavioral: Negative for dysphoric mood. The patient is not nervous/anxious.        Objective:   Physical Exam Ow male in nad No skin breakdown or pressure necrosis from cpap mask LE without edema, no cyanosis Alert, does not appear sleepy, moves all 4        Assessment & Plan:

## 2011-03-15 NOTE — Patient Instructions (Signed)
Will order a new machine thru your DME company Work on weight loss, keep up with supplies followup with me in one year.

## 2011-03-31 ENCOUNTER — Telehealth: Payer: Self-pay | Admitting: Pulmonary Disease

## 2011-03-31 NOTE — Telephone Encounter (Signed)
Pt has been called today by [facility]

## 2011-03-31 NOTE — Telephone Encounter (Signed)
Will forward to PCC's so they may check on this and call the patient with an update or re: changing DME company.

## 2011-04-20 ENCOUNTER — Ambulatory Visit (INDEPENDENT_AMBULATORY_CARE_PROVIDER_SITE_OTHER): Payer: BC Managed Care – PPO | Admitting: Internal Medicine

## 2011-04-20 ENCOUNTER — Encounter: Payer: Self-pay | Admitting: Internal Medicine

## 2011-04-20 VITALS — BP 140/82 | HR 82 | Wt 291.0 lb

## 2011-04-20 DIAGNOSIS — K4090 Unilateral inguinal hernia, without obstruction or gangrene, not specified as recurrent: Secondary | ICD-10-CM

## 2011-04-20 NOTE — Progress Notes (Signed)
  Subjective:    Patient ID: [name], male    DOB: 1970/10/18, 40 y.o.   MRN: [id number]  HPI SCROTAL  PAIN: Location: L scrotum  Onset: 07/2010   Radiation: no  Severity: up to 7 Quality: hard, dull   Duration: constant; affecting sleep  Better with: Vicodin & with urination or BM  Worse with: physical activity Symptoms Nausea/Vomiting: no  Diarrhea: no  Constipation: no  Melena/BRBPR: no  Anorexia: no  Fever/Chills: no  Dysuria/ hematuria/pyuria: no  Wt loss: no   MRI of the hip 03/16/2011 revealed a 2.3 x 2.0 cm fat signal along the left spermatic curve suggesting a lipoma or fat containing hernia.  A prior evaluation by the urologist resulted in a diagnosis of referred pain to the scrotum from his chronic degenerative lumbosacral changes.         Review of Systems     Objective:   Physical Exam  He is experiencing some discomfort at this time in the scrotum; he is in no acute distress  There is no scleral icterus or jaundice.  He has no lymphadenopathy about the head and neck, axilla, or inguinal areas.  He has a regular rhythm with S4  Abdomen reveals minimal tenderness left lower quadrant. He has no organomegaly, masses, or aneurysms.  Genital exam reveals a varicocele on the left. Clinically I cannot appreciate the  hernia documented on MRI        Assessment & Plan:   #1 constant scrotal pain compromising quality of life. 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

## 2011-04-20 NOTE — Patient Instructions (Signed)
Please take corrected record & MRI  to Dr Lynann Bologna

## 2011-04-23 ENCOUNTER — Ambulatory Visit (INDEPENDENT_AMBULATORY_CARE_PROVIDER_SITE_OTHER): Payer: BC Managed Care – PPO | Admitting: General Surgery

## 2011-04-23 ENCOUNTER — Encounter (INDEPENDENT_AMBULATORY_CARE_PROVIDER_SITE_OTHER): Payer: Self-pay | Admitting: General Surgery

## 2011-04-23 VITALS — BP 155/86 | HR 76 | Temp 97.2°F

## 2011-04-23 DIAGNOSIS — R1032 Left lower quadrant pain: Secondary | ICD-10-CM

## 2011-04-27 ENCOUNTER — Encounter (INDEPENDENT_AMBULATORY_CARE_PROVIDER_SITE_OTHER): Payer: Self-pay | Admitting: General Surgery

## 2011-04-27 DIAGNOSIS — R1032 Left lower quadrant pain: Secondary | ICD-10-CM | POA: Insufficient documentation

## 2011-04-27 NOTE — Progress Notes (Signed)
Subjective:     Patient ID: [name], male   DOB: 06/13/1971, 40 y.o.   MRN: [id number]  HPI We are asked to see the patient in consultation by Dr. Alwyn Ren to evaluate him for a left inguinal hernia. The patient is a 40 year old white male who has been experiencing left groin pain since about last November. It seemed all started after he got an injection in his back. That night he had severe pain and couldn't get comfortable.He has never noticed a bulge in his groin. He states the pain seems to be worse in the morning. He seems to have a lot of coughing and gagging in the morning. He states the injections in his back have not helped the pain in his groin area. He also states that he saw Dr. Vernie Ammons in urology who evaluated his testicle and said it was okay. He recently underwent an MRI of his back which showed some fat in his left inguinal canal. This finding question whether he could have an inguinal hernia on that side. He actually had a CT scan of his pelvis back in May of this year. This was reviewed and I did not see any evidence of an inguinal hernia at that time.  Review of Systems  Constitutional: Positive for fatigue.  HENT: Positive for neck stiffness.   Eyes: Negative.   Respiratory: Negative.   Cardiovascular: Negative.   Gastrointestinal: Negative.   Genitourinary: Positive for testicular pain.  Musculoskeletal: Positive for back pain.  Neurological: Negative.   Hematological: Negative.   Psychiatric/Behavioral: Positive for agitation.       Objective:   Physical Exam  Constitutional: He is oriented to person, place, and time. He appears well-developed and well-nourished.  HENT:  Head: Normocephalic and atraumatic.  Eyes: Conjunctivae and EOM are normal. Pupils are equal, round, and reactive to light.  Neck: Normal range of motion. Neck supple.  Cardiovascular: Normal rate, regular rhythm and normal heart sounds.   Pulmonary/Chest: Effort normal and breath sounds normal.    Abdominal: Soft. Bowel sounds are normal. There is tenderness.  Genitourinary: Penis normal.       The patient has no palpable fullness or impulse no straining in either groin.  Musculoskeletal: Normal range of motion.  Neurological: He is alert and oriented to person, place, and time.  Skin: Skin is warm and dry.  Psychiatric: He has a normal mood and affect. His behavior is normal.       Assessment:     The patient continues to have left groin pain. The location of the pain certainly could be consistent with an inguinal hernia although I cannot appreciate an inguinal hernia by physical exam and I do not see any evidence of it on CT scan. He does have significant degenerative disease in his back. I think this is probably the most likely source of his pain.    Plan:     At this point he will follow back up with Dr. Shon Baton. We will certainly be happy to reevaluate him should anything change. At this time though I cannot appreciate an inguinal hernia

## 2011-05-03 ENCOUNTER — Ambulatory Visit (INDEPENDENT_AMBULATORY_CARE_PROVIDER_SITE_OTHER): Payer: BC Managed Care – PPO | Admitting: Surgery

## 2011-06-15 ENCOUNTER — Ambulatory Visit (INDEPENDENT_AMBULATORY_CARE_PROVIDER_SITE_OTHER): Payer: BC Managed Care – PPO | Admitting: Family Medicine

## 2011-06-15 ENCOUNTER — Telehealth: Payer: Self-pay | Admitting: Family Medicine

## 2011-06-15 ENCOUNTER — Encounter: Payer: Self-pay | Admitting: Family Medicine

## 2011-06-15 VITALS — BP 136/88 | HR 80 | Temp 98.7°F | Ht 75.0 in | Wt 291.8 lb

## 2011-06-15 DIAGNOSIS — E785 Hyperlipidemia, unspecified: Secondary | ICD-10-CM

## 2011-06-15 DIAGNOSIS — R911 Solitary pulmonary nodule: Secondary | ICD-10-CM

## 2011-06-15 DIAGNOSIS — R1032 Left lower quadrant pain: Secondary | ICD-10-CM

## 2011-06-15 DIAGNOSIS — I4891 Unspecified atrial fibrillation: Secondary | ICD-10-CM

## 2011-06-15 DIAGNOSIS — G4733 Obstructive sleep apnea (adult) (pediatric): Secondary | ICD-10-CM

## 2011-06-15 DIAGNOSIS — J984 Other disorders of lung: Secondary | ICD-10-CM

## 2011-06-15 NOTE — Telephone Encounter (Signed)
Message copied by Eustaquio Boyden on Tue Jun 15, 2011  9:43 PM ------      Message from: Josph Macho A      Created: Tue Jun 15, 2011  3:45 PM       [place] had no record of him ever receiving a tetanus vaccine. They said his paper chart had a "?" for every time he was asked about it.

## 2011-06-15 NOTE — Assessment & Plan Note (Signed)
Reports compliance

## 2011-06-15 NOTE — Progress Notes (Signed)
Subjective:    Patient ID: [name], male    DOB: Nov 18, 1970, 40 y.o.   MRN: [id number]  HPI CC: new pt establish [facility]  transfer care from Dr. Alwyn Ren, here is closer to home.  Testicular pain - reviewed last few notes from prior PCP.  never saw surgery.  CT abd/pelvis showed possible fat containing hernia.  Groin pain has slowly resolved.    Chronic back problems s/p mult surgeries.  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  Has seen Dr. Newell Coral and Shon Baton in past.  Has had hip arthrogram by Dr. Ethelene Hal.  Saw Dr. Antony Odea for hip.  Told has 2 bone spurs, torn ligament and arthritis in left hip.  Considering surgery.  Left leg stays in constant pain.  Wakes him up at night.  Has been referred to [facility] to do less invasive surgery.  Awaiting call from them to schedule.  H/o Afib since age 55yo.  Has been cardioverted 4 times.  Last was 2-3 yrs ago.  Sees cards [name]) yearly.  Takes flecainide 300mg  to try and self convert.  This happens ~3 times/year.  Told to notify cards if in afib for >72 hours.    OSA - sees Dr. Shelle Iron.  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  Found to have incidental LLL pulm nodule 01/2011, rec rpt CT 3 mo later.  Has not had rpt.  Preventative: Thinks UTD tetanus.  None available Flu shot at work. Colonoscopy - done 2011.  Had polyps, told to rpt q15yrs. Gets DOT physicals every year.  Had 12/2010.  Drives truck  Medications and allergies reviewed and updated in chart.  Past histories reviewed and updated if relevant as below. Patient Active Problem List  Diagnoses  . HYPERLIPIDEMIA  . OBSTRUCTIVE SLEEP APNEA  . BENIGN PAROXYSMAL POSITIONAL VERTIGO  . ATRIAL FIBRILLATION, PAROXYSMAL  . GERD  . RECTAL BLEEDING  . PARONYCHIA  . BACK PAIN, THORACIC REGION  . LOW BACK PAIN SYNDROME  . BACK PAIN  . HEADACHE  . NECK SPRAIN AND STRAIN  . DEGENERATIVE DISC DISEASE, LUMBOSACRAL SPINE W/RADICULOPATHY  . TESTICULAR PAIN, LEFT  . PALPITATIONS    . Sinus infection  . Left groin pain   Past Medical History  Diagnosis Date  . Atrial fibrillation     paroxysmal, controlled with flecainide  . Other and unspecified hyperlipidemia   . OSA on CPAP     Dr. Shelle Iron  . Esophageal reflux   . Arnold-Chiari malformation 2001    s/p decompression  . Fatigue   . Persistent headaches     seen headache clinic Dr. Meryl Crutch  . Arthritis   . Internal hemorrhoids   . Scrotal hernia   . Colon polyps 2011    rpt Q5 yrs  . Seasonal allergies   . History of chicken pox    Past Surgical History  Procedure Date  . Adenoidectomy 11/2000  . Cardioversion 1996, 2008    x2 in 1996  . Hiatal hernia repair 1999  . Arnold chiari decompression 2002    Dr. Newell Coral  . Colonoscopy 2011    polyp, rec rpt 5 yrs  . Back surgery 09/2010    L5 laminectomy, diskectomy (Dr. Shon Baton)   History  Substance Use Topics  . Smoking status: Former Smoker -- 1.0 packs/day for 20 years    Types: Cigarettes    Quit date: 09/20/2008  . Smokeless tobacco: Never Used   Comment: 1 ppd for approx 20 years  . Alcohol  Use: No   Family History  Problem Relation Age of Onset  . Hypertension Mother   . Heart attack Mother 21  . Heart disease Mother   . Coronary artery disease Mother 81  . Coronary artery disease Father 34    6v CABG  . Heart disease Father 73    CABG  . Diabetes Father   . Alcohol abuse Father   . Liver disease Other     Grandmother (P or M not specified)  . Cancer Paternal Uncle     lung, leukemia   Allergies  Allergen Reactions  . Duloxetine     REACTION: made me spacey   Current Outpatient Prescriptions on File Prior to Visit  Medication Sig Dispense Refill  . celecoxib (CELEBREX) 200 MG capsule Take 200 mg by mouth daily.        Marland Kitchen gabapentin (NEURONTIN) 600 MG tablet Take 600 mg by mouth 3 (three) times daily.        Marland Kitchen HYDROcodone-acetaminophen (NORCO) 5-325 MG per tablet as needed.       Review of Systems  Constitutional:  Negative for fever, chills, activity change, appetite change, fatigue and unexpected weight change.  HENT: Negative for hearing loss and neck pain.   Eyes: Negative for visual disturbance.  Respiratory: Negative for cough, chest tightness, shortness of breath and wheezing.   Cardiovascular: Positive for palpitations (h/o afib). Negative for chest pain and leg swelling.  Gastrointestinal: Negative for nausea, vomiting, abdominal pain, diarrhea, constipation, blood in stool and abdominal distention.  Genitourinary: Negative for hematuria and difficulty urinating.  Musculoskeletal: Negative for myalgias and arthralgias.  Skin: Negative for rash.  Neurological: Negative for dizziness, seizures, syncope and headaches.  Hematological: Does not bruise/bleed easily.  Psychiatric/Behavioral: Negative for dysphoric mood. The patient is not nervous/anxious.        Objective:   Physical Exam  Nursing note and vitals reviewed. Constitutional: He is oriented to person, place, and time. He appears well-developed and well-nourished. No distress.       overweight  HENT:  Head: Normocephalic and atraumatic.  Right Ear: External ear normal.  Left Ear: External ear normal.  Nose: Nose normal.  Mouth/Throat: Oropharynx is clear and moist.  Eyes: Conjunctivae and EOM are normal. Pupils are equal, round, and reactive to light.  Neck: Normal range of motion. Neck supple. No thyromegaly present.  Cardiovascular: Normal rate, regular rhythm, normal heart sounds and intact distal pulses.   No murmur heard. Pulses:      Radial pulses are 2+ on the right side, and 2+ on the left side.  Pulmonary/Chest: Effort normal and breath sounds normal. No respiratory distress. He has no wheezes. He has no rales.  Abdominal: Soft. Bowel sounds are normal. He exhibits no distension and no mass. There is no tenderness. There is no rebound and no guarding.  Musculoskeletal: Normal range of motion.  Lymphadenopathy:    He  has no cervical adenopathy.  Neurological: He is alert and oriented to person, place, and time.       CN grossly intact, station and gait intact  Skin: Skin is warm and dry. No rash noted.  Psychiatric: He has a normal mood and affect. His behavior is normal. Judgment and thought content normal.      Assessment & Plan:

## 2011-06-15 NOTE — Assessment & Plan Note (Signed)
followed by cards Dr. [name], uses flecainide PRN afib episodes.   Knows to notify cards if afib lasts >72 hrs. seems due for f/u, will recommend this.

## 2011-06-15 NOTE — Patient Instructions (Addendum)
Pass by [name]'s office to schedule CT scan of chest to follow that single pulmonary nodule. Good to meet you today.  Call us with quesitons. Return as needed. Let us know how hip surgery goes. Keep an eye on the heart.

## 2011-06-15 NOTE — Assessment & Plan Note (Addendum)
Seems to be coming from hip - awaiting call by [facility] to schedule left hip surgery, ?arthroscopic. Advised to notify us after surgery if sxs continued. A total of >45 minutes were spent face-to-face with the patient during this encounter and over half of that time was spent on counseling and coordination of care, reviewing old charts and past medical history.

## 2011-06-15 NOTE — Assessment & Plan Note (Signed)
Will set up with CT chest to follow, LLL posterior periphery, checked with rads re without contrast - should be fine.

## 2011-06-21 HISTORY — PX: OTHER SURGICAL HISTORY: SHX169

## 2011-06-25 ENCOUNTER — Encounter: Payer: Self-pay | Admitting: Family Medicine

## 2011-06-25 ENCOUNTER — Ambulatory Visit (INDEPENDENT_AMBULATORY_CARE_PROVIDER_SITE_OTHER)
Admission: RE | Admit: 2011-06-25 | Discharge: 2011-06-25 | Disposition: A | Discharge status: Home / Self Care | Payer: BC Managed Care – PPO | Source: Ambulatory Visit | Attending: Family Medicine | Admitting: Family Medicine

## 2011-06-25 DIAGNOSIS — J984 Other disorders of lung: Secondary | ICD-10-CM

## 2011-06-25 DIAGNOSIS — R911 Solitary pulmonary nodule: Secondary | ICD-10-CM

## 2011-06-28 LAB — I-STAT 8, (EC8 V) (CONVERTED LAB)
BUN: 18
Chloride: 106
pCO2, Ven: 46.9
pH, Ven: 7.4 — ABNORMAL HIGH

## 2011-06-28 LAB — POCT I-STAT CREATININE
Creatinine, Ser: 1.3
Operator id: 270111

## 2011-06-28 LAB — POCT CARDIAC MARKERS
CKMB, poc: <1 — ABNORMAL LOW
Myoglobin, poc: 52.3
Operator id: 270111

## 2011-07-22 ENCOUNTER — Ambulatory Visit (INDEPENDENT_AMBULATORY_CARE_PROVIDER_SITE_OTHER): Payer: BC Managed Care – PPO | Admitting: Family Medicine

## 2011-07-22 ENCOUNTER — Encounter: Payer: Self-pay | Admitting: Family Medicine

## 2011-07-22 DIAGNOSIS — F17200 Nicotine dependence, unspecified, uncomplicated: Secondary | ICD-10-CM | POA: Insufficient documentation

## 2011-07-22 DIAGNOSIS — J329 Chronic sinusitis, unspecified: Secondary | ICD-10-CM | POA: Insufficient documentation

## 2011-07-22 DIAGNOSIS — R911 Solitary pulmonary nodule: Secondary | ICD-10-CM

## 2011-07-22 DIAGNOSIS — J984 Other disorders of lung: Secondary | ICD-10-CM

## 2011-07-22 MED ORDER — AMOXICILLIN-POT CLAVULANATE 875-125 MG PO TABS: 1.0000 | ORAL_TABLET | Freq: Two times a day (BID) | ORAL | Status: AC

## 2011-07-22 NOTE — Patient Instructions (Signed)
You have a sinus infection.  Likely viral as early on. Continue flonase. Push fluids and plenty of rest. Nasal saline irrigation or neti pot to help drain sinuses. May use simple mucinex (no decongestant D component) with plenty of fluid to help mobilize mucous. Let us know if fever >101.5, trouble opening/closing mouth, difficulty swallowing, or worsening - you may need to be seen again.

## 2011-07-22 NOTE — Assessment & Plan Note (Signed)
Restarted smoking. Continue to encourage cessation.  

## 2011-07-22 NOTE — Assessment & Plan Note (Signed)
Resolved. Will remove from list. 

## 2011-07-22 NOTE — Progress Notes (Signed)
  Subjective:    Patient ID: [name], male    DOB: 23-Sep-1970, 40 y.o.   MRN: [id number]  HPI CC: i have a sinus infection  2d h/o sinus sxs.  Nasal congestion, +PNDrainage, throat and nose burning.  Green/yellow sputum.  + sinus headache frontal.  At 3am this morning took allegra.  Has been using neti pot.  Using nasal spray as well (?astelin).  No fevers/chills, abd pain, n/v.  Tends to get sinus infections twice a year.  Usually prolonged 10-15 days.   Wife smoking.  Pt started smoking again - 5-10 /day.  No sick contacts at home.  No h/o asthma.  Saw Dr. Caswell Corwin on Tuesday, to have orthoscopic surgery on hip.  Will be on crutches for 4-6 wks.  [facility].  Review of Systems Per HPi    Objective:   Physical Exam  Nursing note and vitals reviewed. Constitutional: He appears well-developed and well-nourished. No distress.       Evidently congested  HENT:  Head: Normocephalic and atraumatic.  Right Ear: External ear and ear canal normal.  Left Ear: External ear and ear canal normal.  Nose: Mucosal edema (R>L) and rhinorrhea present. Right sinus exhibits no maxillary sinus tenderness and no frontal sinus tenderness. Left sinus exhibits no maxillary sinus tenderness and no frontal sinus tenderness.  Mouth/Throat: Uvula is midline and mucous membranes are normal. Posterior oropharyngeal erythema present. No oropharyngeal exudate, posterior oropharyngeal edema or tonsillar abscesses.       Cerumen bilaterally covering TMs  Eyes: Conjunctivae and EOM are normal. Pupils are equal, round, and reactive to light. No scleral icterus.  Neck: Normal range of motion. Neck supple. No thyromegaly present.  Cardiovascular: Normal rate, regular rhythm, normal heart sounds and intact distal pulses.   No murmur heard.      Regular   Pulmonary/Chest: Effort normal and breath sounds normal. No respiratory distress. He has no wheezes. He has no rales.    Lymphadenopathy:    He has no cervical adenopathy.  Skin: Skin is warm and dry. No rash noted.      Assessment & Plan:

## 2011-07-22 NOTE — Assessment & Plan Note (Signed)
Discussed likely viral process as early on.  Treat supportively for now, mucinex, fluids, neti pot and continue flonase he has at home. Does have h/o becoming bacterial sinusitis in past. Update [place] if not improving as expected. Provided with augmentin script to hold on to in case not improving as expected.

## 2011-09-03 ENCOUNTER — Telehealth: Payer: Self-pay | Admitting: Internal Medicine

## 2011-09-10 ENCOUNTER — Encounter: Payer: Self-pay | Admitting: Family Medicine

## 2011-09-10 ENCOUNTER — Ambulatory Visit (INDEPENDENT_AMBULATORY_CARE_PROVIDER_SITE_OTHER): Payer: BC Managed Care – PPO | Admitting: Family Medicine

## 2011-09-10 VITALS — BP 124/78 | HR 76 | Temp 98.7°F | Ht 75.0 in | Wt 290.8 lb

## 2011-09-10 DIAGNOSIS — L819 Disorder of pigmentation, unspecified: Secondary | ICD-10-CM

## 2011-09-10 DIAGNOSIS — F17200 Nicotine dependence, unspecified, uncomplicated: Secondary | ICD-10-CM

## 2011-09-10 DIAGNOSIS — I4891 Unspecified atrial fibrillation: Secondary | ICD-10-CM

## 2011-09-10 DIAGNOSIS — Z0289 Encounter for other administrative examinations: Secondary | ICD-10-CM

## 2011-09-10 DIAGNOSIS — Z01818 Encounter for other preprocedural examination: Secondary | ICD-10-CM | POA: Insufficient documentation

## 2011-09-10 DIAGNOSIS — L814 Other melanin hyperpigmentation: Secondary | ICD-10-CM | POA: Insufficient documentation

## 2011-09-10 DIAGNOSIS — J329 Chronic sinusitis, unspecified: Secondary | ICD-10-CM

## 2011-09-10 LAB — COMPREHENSIVE METABOLIC PANEL
AST: 21 U/L (ref 0–37)
Alkaline Phosphatase: 59 U/L (ref 39–117)
BUN: 18 mg/dL (ref 6–23)
Calcium: 9.2 mg/dL (ref 8.4–10.5)
Creatinine, Ser: 1.1 mg/dL (ref 0.4–1.5)

## 2011-09-10 NOTE — Assessment & Plan Note (Signed)
Strongly encouraged cessation in preparation for surgery.  Wants to try cold [place].

## 2011-09-10 NOTE — Assessment & Plan Note (Signed)
In family hx hemochromatosis, reasonable to check LFT and ferritin.

## 2011-09-10 NOTE — Patient Instructions (Addendum)
You need to quit smoking, especially around surgery.  If you want assistance to quit , let me know.  Look into [email or web address]. I will check with Dr. [name] about any other meds that may be needed and then will fax over note and blood work to Dr. Caswell Corwin. Let us know what number to fax note to.

## 2011-09-10 NOTE — Progress Notes (Signed)
Subjective:    Patient ID: JAVONTE ELENES, male    DOB: 10-04-1970, 40 y.o.   MRN: [id number]  HPI CC: surg clearance hip surgery  Mr. Glad is pleasant 40yo with h/o OSA on CPAP (setting of 15), paroxysmal afib followed by Dr. [name] controlled with prn flecainide 300mg  presents for surgical clearance.  Has next f/u with Dr. [name] 03/2011.  L hip arthroplasty scheduled for 1/3/203 by Dr. Caswell Corwin at [facility] (ortho).  Has been told may need hip replacement w/in 5 yrs.  Had back surgery 09/2010 under general anesthesia, did well with this, did not need perioperative beta blockade, no reaction to anesthesia.  He is a former smoker with ~20 PY hx, quit in 2010, restarted this year 2/2 increased stress and smoking 10-15cig/day.  Will try and cut back for surgery.  Currently seeing Dr. Loraine Leriche Philips at [facility].  Was told to be checked for hemochromatosis.  Has cousin with hemochromatosis.  Had chest CT with contrast 05/2011 for pulm nodule, resolved.  Heart and lungs unremarkable on that study.  Able to work daily without dyspnea/angina.  Walks for exercise, ie shopping recently.  Walks 1 mile/day.  No SOB, chest pain/tightness, dizziness, HA, recent palpitations.  Able to walk up flight of stairs without getting short of breath.  Main limitation is MSK - L leg pain with increased exhertion.  Wt Readings from Last 3 Encounters:  09/10/11 290 lb 12 oz (131.883 kg)  07/22/11 292 lb (132.45 kg)  06/15/11 291 lb 12 oz (132.337 kg)  Body mass index is 36.34 kg/(m^2).  Medications and allergies reviewed and updated in chart.  Past histories reviewed and updated if relevant as below. Patient Active Problem List  Diagnoses  . HYPERLIPIDEMIA  . OBSTRUCTIVE SLEEP APNEA  . ATRIAL FIBRILLATION, PAROXYSMAL  . GERD  . BACK PAIN, THORACIC REGION  . LOW BACK PAIN SYNDROME  . HEADACHE  . DEGENERATIVE DISC DISEASE, LUMBOSACRAL SPINE W/RADICULOPATHY  . Left groin pain  . Sinusitis  . Smoker     Past Medical History  Diagnosis Date  . Paroxysmal a-fib     controlled with flecainide 300mg  prn  . HLD (hyperlipidemia)   . OSA on CPAP     Dr. Shelle Iron  . Esophageal reflux   . Arnold-Chiari malformation 2002    s/p decompression  . Fatigue   . Persistent headaches     seen headache clinic Dr. Meryl Crutch  . Arthritis   . Internal hemorrhoids   . Scrotal hernia   . Colon polyps 2011    rpt Q5 yrs  . Seasonal allergies   . History of chicken pox    Past Surgical History  Procedure Date  . Adenoidectomy 11/2000  . Cardioversion 1996, 2008    x2 in 1996  . Hiatal hernia repair 1999  . Arnold chiari decompression 2002    Dr. Newell Coral  . Colonoscopy 2011    polyp, rec rpt 5 yrs  . Back surgery 09/2010    L5 laminectomy, diskectomy (Dr. Shon Baton)  . Ct chest noncontrast 06/2011    f/u L pulm nodule - gone.   History  Substance Use Topics  . Smoking status: Current Everyday Smoker -- 1.0 packs/day for 20 years    Types: Cigarettes    Last Attempt to Quit: 09/20/2008  . Smokeless tobacco: Never Used   Comment: 1 ppd for approx 20 years  . Alcohol Use: No   Family History  Problem Relation Age of Onset  . Hypertension  Mother   . Heart attack Mother 39  . Heart disease Mother   . Coronary artery disease Mother 76  . Coronary artery disease Father 60    6v CABG  . Heart disease Father 26    CABG  . Diabetes Father   . Alcohol abuse Father   . Liver disease Other     Grandmother (P or M not specified)  . Cancer Paternal Uncle     lung, leukemia   Allergies  Allergen Reactions  . Duloxetine     REACTION: made me spacey   Current Outpatient Prescriptions on File Prior to Visit  Medication Sig Dispense Refill  . celecoxib (CELEBREX) 200 MG capsule Take 200 mg by mouth daily.        Marland Kitchen gabapentin (NEURONTIN) 600 MG tablet Take 600 mg by mouth 3 (three) times daily.        Marland Kitchen HYDROcodone-acetaminophen (NORCO) 5-325 MG per tablet as needed.       Review of  Systems Per HPI    Objective:   Physical Exam  Nursing note and vitals reviewed. Constitutional: He appears well-developed and well-nourished. No distress.  HENT:  Head: Normocephalic and atraumatic.  Mouth/Throat: Oropharynx is clear and moist. No oropharyngeal exudate.  Eyes: Conjunctivae and EOM are normal. Pupils are equal, round, and reactive to light. No scleral icterus.  Neck: Normal range of motion. Neck supple. Carotid bruit is not present.  Cardiovascular: Normal rate, regular rhythm, normal heart sounds and intact distal pulses.   No murmur heard. Pulmonary/Chest: Effort normal and breath sounds normal. No respiratory distress. He has no wheezes. He has no rales.  Abdominal: Soft. Bowel sounds are normal. He exhibits no distension. There is no hepatosplenomegaly. There is no tenderness. There is no rebound and no guarding.  Musculoskeletal: He exhibits no edema.  Lymphadenopathy:    He has no cervical adenopathy.  Skin: Skin is warm and dry. No rash noted.       Does have bronzing of skin  Psychiatric: He has a normal mood and affect.       Assessment & Plan:

## 2011-09-10 NOTE — Assessment & Plan Note (Signed)
Controlled with PRN flecainide 300mg , follows with cards if Afib >72hrs. No recent episodes of Afib.

## 2011-09-10 NOTE — Assessment & Plan Note (Addendum)
Scheduled hip arthroscopy for patient with h/o paroxysmal afib and OSA well controlled with CPAP. Activity not limited by cardiac status, rather by MSK issues. Has tolerated GETA in past (last 09/2010). Check lytes, Cr today. Recent CT chest WNL (05/2011). Will likely receive blood thinners post op to prevent DVT. Should be low risk.  I will check with cards regarding any other perioperative recommendations given h/o pAfib. EKG - NSR at 70 with normal axis, intervals, no hypertrophy or ST/T changes.  ==>ADDENDUM:did touch base with Dr. Berton Mount (pt's cardiologist) regarding perioperative beta blockade - does recommend starting metoprolol 25mg  bid 24-36 hrs prior to surgery, will send med in and ask pt to start taking 2 nights prior to surgery.

## 2011-09-11 ENCOUNTER — Telehealth: Payer: Self-pay | Admitting: Family Medicine

## 2011-09-11 MED ORDER — METOPROLOL TARTRATE 25 MG PO TABS: 25.0000 mg | ORAL_TABLET | Freq: Two times a day (BID) | ORAL | Status: DC

## 2011-09-11 NOTE — Telephone Encounter (Addendum)
Can we call pt and notify I spoke with Dr. [name] and would like him to take metoprolol 25mg  twice daily starting 2 nights prior to surgery and continue for 2wks. I have added this to note and sent med to pharmacy for him to start.  This will hopefully help decrease risk of afib around surgery.  Please fax note, labwork, and EKG to Dr. [facility].

## 2011-09-13 NOTE — Telephone Encounter (Signed)
Message left notifying patient. Advised to call with any questions. Notes faxed to Dr. Caswell Corwin as directed.

## 2011-09-23 HISTORY — PX: HIP ARTHROSCOPY: SUR88

## 2011-09-29 NOTE — Telephone Encounter (Signed)
Opened in error by Pam Clement 

## 2011-10-18 ENCOUNTER — Other Ambulatory Visit: Payer: Self-pay | Admitting: Orthopedic Surgery

## 2011-10-18 ENCOUNTER — Ambulatory Visit
Admission: RE | Admit: 2011-10-18 | Discharge: 2011-10-18 | Disposition: A | Discharge status: Home / Self Care | Payer: BC Managed Care – PPO | Source: Ambulatory Visit | Attending: Orthopedic Surgery | Admitting: Orthopedic Surgery

## 2011-10-18 DIAGNOSIS — M545 Low back pain, unspecified: Secondary | ICD-10-CM

## 2011-10-18 DIAGNOSIS — M542 Cervicalgia: Secondary | ICD-10-CM

## 2012-01-19 DIAGNOSIS — N200 Calculus of kidney: Secondary | ICD-10-CM | POA: Insufficient documentation

## 2012-02-01 ENCOUNTER — Encounter: Payer: Self-pay | Admitting: Family Medicine

## 2012-02-01 ENCOUNTER — Ambulatory Visit (INDEPENDENT_AMBULATORY_CARE_PROVIDER_SITE_OTHER): Payer: BC Managed Care – PPO | Admitting: Family Medicine

## 2012-02-01 VITALS — BP 134/84 | HR 84 | Temp 98.7°F | Wt 289.5 lb

## 2012-02-01 DIAGNOSIS — N200 Calculus of kidney: Secondary | ICD-10-CM

## 2012-02-01 DIAGNOSIS — M545 Low back pain, unspecified: Secondary | ICD-10-CM

## 2012-02-01 DIAGNOSIS — R319 Hematuria, unspecified: Secondary | ICD-10-CM

## 2012-02-01 LAB — POCT URINALYSIS DIPSTICK
Bilirubin, UA: NEGATIVE
Ketones, UA: NEGATIVE
Leukocytes, UA: NEGATIVE
Protein, UA: NEGATIVE

## 2012-02-01 MED ORDER — TAMSULOSIN HCL 0.4 MG PO CAPS: 0.4000 mg | ORAL_CAPSULE | Freq: Every day | ORAL | Status: DC

## 2012-02-01 MED ORDER — NAPROXEN 500 MG PO TABS: ORAL_TABLET | ORAL | Status: DC

## 2012-02-01 NOTE — Patient Instructions (Signed)
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 Treat with naprosyn twice daily for 5 days then as needed.  Don't take with prednisone or with celebrex. May use vicodin for breakthrough pain. flomax sent to pharmacy as well - to help open up urinary passage. Strain urine.  Kidney Stones Kidney stones (ureteral lithiasis) are deposits that form inside your kidneys. The intense pain is caused by the stone moving through the urinary tract. When the stone moves, the ureter goes into spasm around the stone. The stone is usually passed in the urine.  CAUSES   A disorder that makes certain neck glands produce too much parathyroid hormone (primary hyperparathyroidism).   A buildup of uric acid crystals.   Narrowing (stricture) of the ureter.   A kidney obstruction present at birth (congenital obstruction).   Previous surgery on the kidney or ureters.   Numerous kidney infections.  SYMPTOMS   Feeling sick to your stomach (nauseous).   Throwing up (vomiting).   Blood in the urine (hematuria).   Pain that usually spreads (radiates) to the groin.   Frequency or urgency of urination.  DIAGNOSIS   Taking a history and physical exam.   Blood or urine tests.   Computerized X-ray scan (CT scan).   Occasionally, an examination of the inside of the urinary bladder (cystoscopy) is performed.  TREATMENT   Observation.   Increasing your fluid intake.   Surgery may be needed if you have severe pain or persistent obstruction.  The size, location, and chemical composition are all important variables that will determine the proper choice of action for you. Talk to your caregiver to better understand your situation so that you will minimize the risk of injury to yourself and your kidney.  HOME CARE INSTRUCTIONS   Drink enough water and fluids to keep your urine clear or pale yellow.   Strain all urine through the provided strainer. Keep all particulate  matter and stones for your caregiver to see. The stone causing the pain may be as small as a grain of salt. It is very important to use the strainer each and every time you pass your urine. The collection of your stone will allow your caregiver to analyze it and verify that a stone has actually passed.   Only take over-the-counter or prescription medicines for pain, discomfort, or fever as directed by your caregiver.   Make a follow-up appointment with your caregiver as directed.   Get follow-up X-rays if required. The absence of pain does not always mean that the stone has passed. It may have only stopped moving. If the urine remains completely obstructed, it can cause loss of kidney function or even complete destruction of the kidney. It is your responsibility to make sure X-rays and follow-ups are completed. Ultrasounds of the kidney can show blockages and the status of the kidney. Ultrasounds are not associated with any radiation and can be performed easily in a matter of minutes.  SEEK IMMEDIATE MEDICAL CARE IF:   Pain cannot be controlled with the prescribed medicine.   You have a fever.   The severity or intensity of pain increases over 18 hours and is not relieved by pain medicine.   You develop a new onset of abdominal pain.   You feel faint or pass out.  MAKE SURE YOU:   Understand these instructions.   Will watch your condition.   Will get help right away if you are not doing well or get worse.  Document Released: 09/06/2005 Document Revised: 08/26/2011 Document Reviewed: 01/02/2010 [facility] Patient Information 2012 [place], [place].
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
## 2012-02-01 NOTE — Assessment & Plan Note (Signed)
Significant hematuria on UA/micro today (TNTC RBC) as well as calcium oxalate crystals on micro. Anticipate nephrolithiasis, rec back away from so much milk intake, recommended increased water intake. Could consider HCTZ if recurrent. Given no pain currently and micro findings, did not pursue imaging but discussed if not improving as expected to let me know - low threshold for CT scan given some obfuscation given chronic back and left leg pain. For now , provided with naprosyn and flomax to use for next few days then PRN.  Discussed not using steroid, celebrex, naprosyn simultaneously.

## 2012-02-04 ENCOUNTER — Encounter: Payer: Self-pay | Admitting: Family Medicine

## 2012-02-04 ENCOUNTER — Ambulatory Visit (INDEPENDENT_AMBULATORY_CARE_PROVIDER_SITE_OTHER)
Admission: RE | Admit: 2012-02-04 | Discharge: 2012-02-04 | Disposition: A | Discharge status: Home / Self Care | Payer: BC Managed Care – PPO | Source: Ambulatory Visit | Attending: Family Medicine | Admitting: Family Medicine

## 2012-02-04 ENCOUNTER — Ambulatory Visit (INDEPENDENT_AMBULATORY_CARE_PROVIDER_SITE_OTHER): Payer: BC Managed Care – PPO | Admitting: Family Medicine

## 2012-02-04 ENCOUNTER — Telehealth: Payer: Self-pay

## 2012-02-04 VITALS — BP 146/90 | HR 72 | Temp 97.8°F | Wt 289.0 lb

## 2012-02-04 DIAGNOSIS — N200 Calculus of kidney: Secondary | ICD-10-CM

## 2012-02-04 LAB — BASIC METABOLIC PANEL
CO2: 26 mEq/L (ref 19–32)
Calcium: 8.9 mg/dL (ref 8.4–10.5)
Sodium: 141 mEq/L (ref 135–145)

## 2012-02-04 LAB — POCT URINALYSIS DIPSTICK
Glucose, UA: NEGATIVE
Nitrite, UA: NEGATIVE
Protein, UA: NEGATIVE
Urobilinogen, UA: 0.2
pH, UA: 6

## 2012-02-04 MED ORDER — KETOROLAC TROMETHAMINE 60 MG/2ML IM SOLN
60.0000 mg | Freq: Once | INTRAMUSCULAR | Status: AC
  Administered 2012-02-04: 60 mg via INTRAMUSCULAR

## 2012-02-04 NOTE — Assessment & Plan Note (Addendum)
Brings stone, will send off for analysis. Continued pain despite passing stone on Wednesday - will obtain CT abd/pelvis to r/o obstruction and eval for mult stones. Toradol shot today. Declines phenergan shot. Check Cr today. Will call with results. UA - large blood, no evidence of infection though.  ADDENDUM ==> spoke with pt re results.  CT scan - 4.47mm stone at distal ureter left.  + hydronephrosis and hydroureter.  Will refer to Uro.  Placed order in chart.  Still awaiting Cr.  Advised more fluid as urine was concentrated.

## 2012-02-04 NOTE — Progress Notes (Signed)
Addended by: Josph Macho A on: 02/04/2012 09:55 AM   Modules accepted: Orders

## 2012-02-04 NOTE — Patient Instructions (Signed)
Blood work today to check kidneys. Pass by referral coordinator to schedule CT scan for today.  We will call you at [phone]. Push fluids. Continue flomax daily as well as naprosyn twice daily. Use hydrocodone 2 pills at once, if that doesn't help with pain, use percocets as needed but don't take together. Toradol shot today. If pain uncontrolled, you may need to be evaluated at ER for pain control.

## 2012-02-04 NOTE — Telephone Encounter (Signed)
Jane with [facility] radiology called report CT abdomen and pelvis. Report in pts chart. Printed and given to Dr Sharen Hones and he will contact pt.

## 2012-02-04 NOTE — Progress Notes (Signed)
  Subjective:    Patient ID: [name], male    DOB: 1971-04-11, 41 y.o.   MRN: [id number]  HPI CC: kidney stone.  See prior note for details.  Passed small stone Tuesday night.  Pain returned this morning at 4am.  Left flank, left abd and radiating into groin.  Taking flomax as well as naprosyn bid.  Using hydrocodone for breakthrough pain.  Helping with pain but still hurting.  No nausea.  Some hesitancy but voiding overall ok.  Has enough hydrocodone, Dr Vear Clock at [facility].  Has some percocets at home.  No fevers/chills, dysuria.  Review of Systems Per HPI    Objective:   Physical Exam  Nursing note and vitals reviewed. Constitutional: He appears well-developed and well-nourished. He appears distressed.       Some distress noted.  Abdominal: Soft. Bowel sounds are normal. He exhibits no distension and no mass. There is no hepatosplenomegaly. There is tenderness (mild diffusely). There is CVA tenderness (left). There is no rigidity, no rebound and no guarding.  Psychiatric: He has a normal mood and affect.       Assessment & Plan:

## 2012-02-04 NOTE — Progress Notes (Signed)
Addended by: Eustaquio Boyden on: 02/04/2012 12:34 PM   Modules accepted: Orders

## 2012-02-04 NOTE — Telephone Encounter (Signed)
See OV.  Addended.  Spoke with pt.  Refer to urology.

## 2012-02-08 LAB — STONE ANALYSIS: Stone Weight KSTONE: 0.004 g

## 2012-02-19 DIAGNOSIS — N200 Calculus of kidney: Secondary | ICD-10-CM

## 2012-02-19 HISTORY — DX: Calculus of kidney: N20.0

## 2012-02-22 ENCOUNTER — Other Ambulatory Visit: Payer: Self-pay | Admitting: Urology

## 2012-02-29 ENCOUNTER — Encounter (HOSPITAL_BASED_OUTPATIENT_CLINIC_OR_DEPARTMENT_OTHER): Payer: Self-pay | Admitting: *Deleted

## 2012-02-29 NOTE — Progress Notes (Signed)
NPO AFTER MN. ARRIVES AT 1045. NEEDS HG, KUB AND EKG. MAY TAKE PAIN MED. W/ SIPS OF WATER.

## 2012-03-02 ENCOUNTER — Encounter (HOSPITAL_BASED_OUTPATIENT_CLINIC_OR_DEPARTMENT_OTHER): Payer: Self-pay | Admitting: Anesthesiology

## 2012-03-02 ENCOUNTER — Ambulatory Visit (HOSPITAL_BASED_OUTPATIENT_CLINIC_OR_DEPARTMENT_OTHER)
Admission: RE | Admit: 2012-03-02 | Discharge: 2012-03-02 | Disposition: A | Discharge status: Home / Self Care | Payer: BC Managed Care – PPO | Source: Ambulatory Visit | Attending: Urology | Admitting: Urology

## 2012-03-02 ENCOUNTER — Encounter (HOSPITAL_BASED_OUTPATIENT_CLINIC_OR_DEPARTMENT_OTHER): Payer: Self-pay | Admitting: *Deleted

## 2012-03-02 ENCOUNTER — Ambulatory Visit (HOSPITAL_COMMUNITY): Payer: BC Managed Care – PPO

## 2012-03-02 ENCOUNTER — Encounter (HOSPITAL_BASED_OUTPATIENT_CLINIC_OR_DEPARTMENT_OTHER)
Admission: RE | Disposition: A | Discharge status: Home / Self Care | Payer: Self-pay | Source: Ambulatory Visit | Attending: Urology

## 2012-03-02 ENCOUNTER — Ambulatory Visit (HOSPITAL_BASED_OUTPATIENT_CLINIC_OR_DEPARTMENT_OTHER): Payer: BC Managed Care – PPO | Admitting: Anesthesiology

## 2012-03-02 DIAGNOSIS — Z79899 Other long term (current) drug therapy: Secondary | ICD-10-CM | POA: Insufficient documentation

## 2012-03-02 DIAGNOSIS — E785 Hyperlipidemia, unspecified: Secondary | ICD-10-CM | POA: Insufficient documentation

## 2012-03-02 DIAGNOSIS — G4733 Obstructive sleep apnea (adult) (pediatric): Secondary | ICD-10-CM | POA: Insufficient documentation

## 2012-03-02 DIAGNOSIS — N201 Calculus of ureter: Secondary | ICD-10-CM | POA: Insufficient documentation

## 2012-03-02 DIAGNOSIS — F17200 Nicotine dependence, unspecified, uncomplicated: Secondary | ICD-10-CM | POA: Insufficient documentation

## 2012-03-02 DIAGNOSIS — I4891 Unspecified atrial fibrillation: Secondary | ICD-10-CM | POA: Insufficient documentation

## 2012-03-02 HISTORY — DX: Other chronic pain: G89.29

## 2012-03-02 HISTORY — PX: URETEROSCOPY: SHX842

## 2012-03-02 HISTORY — DX: Reserved for concepts with insufficient information to code with codable children: IMO0002

## 2012-03-02 LAB — POCT HEMOGLOBIN-HEMACUE: Hemoglobin: 15.2 g/dL (ref 13.0–17.0)

## 2012-03-02 SURGERY — URETEROSCOPY
Anesthesia: General | Site: Bladder | Laterality: Left | Wound class: Clean Contaminated

## 2012-03-02 MED ORDER — PROPOFOL 10 MG/ML IV EMUL
INTRAVENOUS | Status: DC | PRN
  Administered 2012-03-02: 250 mg via INTRAVENOUS

## 2012-03-02 MED ORDER — FENTANYL CITRATE 0.05 MG/ML IJ SOLN: 25.0000 ug | INTRAMUSCULAR | Status: DC | PRN

## 2012-03-02 MED ORDER — KETOROLAC TROMETHAMINE 30 MG/ML IJ SOLN
INTRAMUSCULAR | Status: DC | PRN
  Administered 2012-03-02: 30 mg via INTRAVENOUS

## 2012-03-02 MED ORDER — PROMETHAZINE HCL 25 MG/ML IJ SOLN: 6.2500 mg | INTRAMUSCULAR | Status: DC | PRN

## 2012-03-02 MED ORDER — SODIUM CHLORIDE 0.9 % IR SOLN
Status: DC | PRN
  Administered 2012-03-02: 6000 mL

## 2012-03-02 MED ORDER — MIDAZOLAM HCL 5 MG/5ML IJ SOLN
INTRAMUSCULAR | Status: DC | PRN
  Administered 2012-03-02: 2 mg via INTRAVENOUS

## 2012-03-02 MED ORDER — DEXAMETHASONE SODIUM PHOSPHATE 4 MG/ML IJ SOLN
INTRAMUSCULAR | Status: DC | PRN
  Administered 2012-03-02: 8 mg via INTRAVENOUS

## 2012-03-02 MED ORDER — LACTATED RINGERS IV SOLN: INTRAVENOUS | Status: DC

## 2012-03-02 MED ORDER — ONDANSETRON HCL 4 MG/2ML IJ SOLN
INTRAMUSCULAR | Status: DC | PRN
  Administered 2012-03-02: 4 mg via INTRAVENOUS

## 2012-03-02 MED ORDER — CIPROFLOXACIN HCL 250 MG PO TABS: 250.0000 mg | ORAL_TABLET | Freq: Two times a day (BID) | ORAL | Status: DC

## 2012-03-02 MED ORDER — LIDOCAINE HCL (CARDIAC) 20 MG/ML IV SOLN
INTRAVENOUS | Status: DC | PRN
  Administered 2012-03-02: 100 mg via INTRAVENOUS

## 2012-03-02 MED ORDER — IOHEXOL 350 MG/ML SOLN
INTRAVENOUS | Status: DC | PRN
  Administered 2012-03-02: 50 mL

## 2012-03-02 MED ORDER — CIPROFLOXACIN IN D5W 200 MG/100ML IV SOLN
200.0000 mg | INTRAVENOUS | Status: AC
  Administered 2012-03-02: 200 mg via INTRAVENOUS

## 2012-03-02 MED ORDER — FENTANYL CITRATE 0.05 MG/ML IJ SOLN
INTRAMUSCULAR | Status: DC | PRN
  Administered 2012-03-02: 100 ug via INTRAVENOUS

## 2012-03-02 MED ORDER — BELLADONNA ALKALOIDS-OPIUM 16.2-60 MG RE SUPP
RECTAL | Status: DC | PRN
  Administered 2012-03-02: 1 via RECTAL

## 2012-03-02 MED ORDER — LACTATED RINGERS IV SOLN
INTRAVENOUS | Status: DC
  Administered 2012-03-02 (×2): via INTRAVENOUS

## 2012-03-02 SURGICAL SUPPLY — 36 items
ADAPTER CATH URET PLST 4-6FR (CATHETERS) IMPLANT
ADPR CATH URET STRL DISP 4-6FR (CATHETERS)
BAG DRAIN URO-CYSTO SKYTR STRL (DRAIN) ×3 IMPLANT
BAG DRN UROCATH (DRAIN) ×2
BASKET LASER NITINOL 1.9FR (BASKET) IMPLANT
BASKET STNLS GEMINI 4WIRE 3FR (BASKET) IMPLANT
BASKET ZERO TIP NITINOL 2.4FR (BASKET) ×2 IMPLANT
BRUSH URET BIOPSY 3F (UROLOGICAL SUPPLIES) IMPLANT
BSKT STON RTRVL 120 1.9FR (BASKET)
BSKT STON RTRVL GEM 120X11 3FR (BASKET)
BSKT STON RTRVL ZERO TP 2.4FR (BASKET) ×2
CANISTER SUCT LVC 12 LTR MEDI- (MISCELLANEOUS) ×2 IMPLANT
CATH INTERMIT  6FR 70CM (CATHETERS) ×3 IMPLANT
CATH URET 5FR 28IN CONE TIP (BALLOONS)
CATH URET 5FR 28IN OPEN ENDED (CATHETERS) IMPLANT
CATH URET 5FR 70CM CONE TIP (BALLOONS) IMPLANT
CLOTH BEACON ORANGE TIMEOUT ST (SAFETY) ×3 IMPLANT
DRAPE CAMERA CLOSED 9X96 (DRAPES) ×3 IMPLANT
ELECT REM PT RETURN 9FT ADLT (ELECTROSURGICAL)
ELECTRODE REM PT RTRN 9FT ADLT (ELECTROSURGICAL) IMPLANT
GLOVE BIO SURGEON STRL SZ 6.5 (GLOVE) ×2 IMPLANT
GLOVE BIO SURGEON STRL SZ8 (GLOVE) ×3 IMPLANT
GLOVE ECLIPSE 6.0 STRL STRAW (GLOVE) ×2 IMPLANT
GOWN PREVENTION PLUS LG XLONG (DISPOSABLE) ×3 IMPLANT
GOWN STRL REIN XL XLG (GOWN DISPOSABLE) ×3 IMPLANT
GUIDEWIRE 0.038 PTFE COATED (WIRE) IMPLANT
GUIDEWIRE ANG ZIPWIRE 038X150 (WIRE) IMPLANT
GUIDEWIRE STR DUAL SENSOR (WIRE) ×2 IMPLANT
IV NS IRRIG 3000ML ARTHROMATIC (IV SOLUTION) ×6 IMPLANT
KIT BALLIN UROMAX 15FX10 (LABEL) IMPLANT
KIT BALLN UROMAX 15FX4 (MISCELLANEOUS) IMPLANT
KIT BALLN UROMAX 26 75X4 (MISCELLANEOUS)
PACK CYSTOSCOPY (CUSTOM PROCEDURE TRAY) ×3 IMPLANT
SET HIGH PRES BAL DIL (LABEL)
SHEATH URET ACCESS 12FR/35CM (UROLOGICAL SUPPLIES) ×2 IMPLANT
SHEATH URET ACCESS 12FR/55CM (UROLOGICAL SUPPLIES) IMPLANT

## 2012-03-02 NOTE — Op Note (Signed)
Preoperative diagnosis: Radiolucent 5 mm left distal ureteral stone  Postoperative diagnosis: Same  Principal procedure: Cystoscopy, left retrograde ureteropyelogram with interpretive fluoroscopy, left ureteroscopy with extraction of left distal ureteral stone  Surgeon: Labrina Lines  Anesthesia: Gen. with LMA  Complications: None  Drains: None  Indications: 41 year old male with persistently symptomatic left distal ureteral stone. This is approximately 5 mm in size. He has been seen in previously evaluated by Dr., who planned ureteroscopy on the 21st of this month. Because the patient has had  Significant persistent pain, he requested this procedure to be removed.. As Dr. Vernie Ammons is out of the office, this will be performed by me.  Description of procedure: The patient was properly identified and marked in the holding area, and received preoperative IV antibiotics. He was taken to the operating room where general anesthetic was administered using the LMA. He was placed in the dorsolithotomy position. Genitalia and perineum were prepped and draped. Proper timeout was then performed.  A 22 French panendoscope was advanced through his urethra and into his bladder. Urethra was normal, prostate nonobstructive. Bladder was entered and inspected circumferentially. No tumors trabeculations or foreign bodies were noted. Both ureteral orifices were normal in configuration and location. The left ureteral orifice was cannulated with the 6 open-ended catheter, and a retrograde pyelogram performed using Omnipaque.  The retrograde pyelogram revealed a filling defect, elongated in nature, in the left distal ureter. There was no hydronephrosis. There were no other ureteral filling defects. Pyelo-calyceal system was normal.  I then ago she did a guidewire up into the left renal pelvis through the open-ended catheter, and removed both the open-ended catheter and the cystoscope. I then dilated the left ureteral  orifice with the inner core of a 15 French ureteral access sheath, over top of the guidewire. I then removed the inner core of the access sheath and placed a 6 French rigid ureteroscope in the bladder under direct vision. The left distal ureter was entered, and the stone encountered. This was grasped with the 0 tip Nitinol basket and extracted. I the small fragment was also extracted. Inspection of the distal and mid ureter revealed no further calculi. There was minimal narrowing of the left distal ureter at the UVJ. The ureteroscope was removed, and I then again dilated the distal ureter with the inner cord the access sheath. The bladder was then drained, the guidewire removed, and the procedure terminated. A B. and O. suppository was given, and the patient received Toradol intravenously prior to the termination of anesthesia. The patient was awakened and taken to the PACU in stable condition.

## 2012-03-02 NOTE — Anesthesia Preprocedure Evaluation (Addendum)
Anesthesia Evaluation  Patient identified by MRN, date of birth, ID band Patient awake    Reviewed: Allergy & Precautions, H&P , NPO status , Patient's Chart, lab work & pertinent test results  Airway Mallampati: II TM Distance: >3 FB Neck ROM: Full    Dental  (+) Teeth Intact and Dental Advisory Given   Pulmonary sleep apnea and Continuous Positive Airway Pressure Ventilation , Current Smoker,  breath sounds clear to auscultation  Pulmonary exam normal       Cardiovascular + dysrhythmias Atrial Fibrillation Rhythm:Regular Rate:Normal     Neuro/Psych  Headaches, History of Arnold-Chiari malformation; s/p decompression. 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 negative psych ROS   GI/Hepatic Neg liver ROS, GERD-  ,  Endo/Other  Morbid obesity  Renal/GU negative Renal ROS  negative genitourinary   Musculoskeletal negative musculoskeletal ROS (+)   Abdominal   Peds  Hematology negative hematology ROS (+)   Anesthesia Other Findings   Reproductive/Obstetrics negative OB ROS                          Anesthesia Physical Anesthesia Plan  ASA: III  Anesthesia Plan: General   Post-op Pain Management:    Induction: Intravenous  Airway Management Planned: LMA  Additional Equipment:   Intra-op Plan:   Post-operative Plan: Extubation in OR  Informed Consent: I have reviewed the patients History and Physical, chart, labs and discussed the procedure including the risks, benefits and alternatives for the proposed anesthesia with the patient or authorized representative who has indicated his/her understanding and acceptance.   Dental advisory given  Plan Discussed with: CRNA  Anesthesia Plan Comments:         Anesthesia Quick Evaluation

## 2012-03-02 NOTE — Transfer of Care (Signed)
Immediate Anesthesia Transfer of Care Note  Patient: [name]  Procedure(s) Performed: Procedure(s) (LRB): URETEROSCOPY (Left)  Patient Location: PACU  Anesthesia Type: General  Level of Consciousness: awake, oriented, sedated and patient cooperative  Airway & Oxygen Therapy: Patient Spontanous Breathing and Patient connected to face mask oxygen  Post-op Assessment: Report given to PACU RN and Post -op Vital signs reviewed and stable  Post vital signs: Reviewed and stable  Complications: No apparent anesthesia complications

## 2012-03-02 NOTE — Anesthesia Procedure Notes (Signed)
Procedure Name: LMA Insertion Date/Time: 03/02/2012 12:40 PM Performed by: Renella Cunas D Pre-anesthesia Checklist: Patient identified, Emergency Drugs available, Suction available and Patient being monitored Patient Re-evaluated:Patient Re-evaluated prior to inductionOxygen Delivery Method: Circle System Utilized Preoxygenation: Pre-oxygenation with 100% oxygen Intubation Type: IV induction Ventilation: Mask ventilation without difficulty LMA: LMA inserted LMA Size: 5.0 Number of attempts: 1 Airway Equipment and Method: bite block Placement Confirmation: positive ETCO2 Tube secured with: Tape Dental Injury: Teeth and Oropharynx as per pre-operative assessment

## 2012-03-02 NOTE — Discharge Instructions (Signed)
[facility] [phone] Post Ureteroscopy With or Without Stent Instructions  Definitions:  Ureter: The duct that transports urine from the kidney to the bladder. Stent:   A plastic hollow tube that is placed into the ureter, from the kidney to the                 bladder to prevent the ureter from swelling shut.  GENERAL INSTRUCTIONS:   You may see some blood in your urine while the stent is in place and a few days afterwards. Do not be alarmed, even if the urine was clear for a while. Get off your feet and drink lots of fluids until clearing occurs. If you start to pass clots or don't improve, call us.  DIET: You may return to your normal diet immediately. Because of the raw surface of your bladder, alcohol, spicy foods, acid type foods and drinks with caffeine may cause irritation or frequency and should be used in moderation. To keep your urine flowing freely and to avoid constipation, drink plenty of fluids during the day ( 8-10 glasses ). Tip: Avoid cranberry juice because it is very acidic.  ACTIVITY: Your physical activity doesn't need to be restricted. However, if you are very active, you may see some blood in your urine. We suggest that you reduce your activity under these circumstances until the bleeding has stopped.  BOWELS: It is important to keep your bowels regular during the postoperative period. Straining with bowel movements can cause bleeding. A bowel movement every other day is reasonable. Use a mild laxative if needed, such as Milk of Magnesia 2-3 tablespoons, or 2 Dulcolax tablets. Call if you continue to have problems. If you have been taking narcotics for pain, before, during or after your surgery, you may be constipated. Take a laxative if necessary.   MEDICATION: You should resume your pre-surgery medications unless told not to. In addition you will often be given an antibiotic to prevent infection. These should be taken as prescribed until the  bottles are finished unless you are having an unusual reaction to one of the drugs.  PROBLEMS YOU SHOULD REPORT TO [place]:  Fevers over 100.5 Fahrenheit.  Heavy bleeding, or clots ( See above notes about blood in urine ).  Inability to urinate.  Drug reactions ( hives, rash, nausea, vomiting, diarrhea ).  Severe burning or pain with urination that is not improving.  FOLLOW-UP: You will need a follow-up appointment to monitor your progress. Call for this appointment at the number listed above. Usually the first appointment will be about three to fourteen days after your surgery.          Post Anesthesia Home Care Instructions  Activity: Get plenty of rest for the remainder of the day. A responsible adult should stay with you for 24 hours following the procedure.  For the next 24 hours, DO NOT: -Drive a car -Advertising copywriter -Drink alcoholic beverages -Take any medication unless instructed by your physician -Make any legal decisions or sign important papers.  Meals: Start with liquid foods such as gelatin or soup. Progress to regular foods as tolerated. Avoid greasy, spicy, heavy foods. If nausea and/or vomiting occur, drink only clear liquids until the nausea and/or vomiting subsides. Call your physician if vomiting continues.  Special Instructions/Symptoms: Your throat may feel dry or sore from the anesthesia or the breathing tube placed in your throat during surgery. If this causes discomfort, gargle with warm salt water. The discomfort should disappear within 24 hours.  Post Anesthesia Home Care Instructions  Activity: Get plenty of rest for the remainder of the day. A responsible adult should stay with you for 24 hours following the procedure.  For the next 24 hours, DO NOT: -Drive a car -Advertising copywriter -Drink alcoholic beverages -Take any medication unless instructed by your physician -Make any legal decisions or sign important papers.  Meals: Start with  liquid foods such as gelatin or soup. Progress to regular foods as tolerated. Avoid greasy, spicy, heavy foods. If nausea and/or vomiting occur, drink only clear liquids until the nausea and/or vomiting subsides. Call your physician if vomiting continues.  Special Instructions/Symptoms: Your throat may feel dry or sore from the anesthesia or the breathing tube placed in your throat during surgery. If this causes discomfort, gargle with warm salt water. The discomfort should disappear within 24 hours.

## 2012-03-02 NOTE — H&P (Signed)
Urology History and Physical Exam  CC: left-sided kidney stone  HPI: 41 year old male with the following history:          Calculus disease: The patient was seen on 02/04/12 by Dr. Sharen Hones and had reported left flank pain with radiation into the left groin region. He apparently had passed a stone but a CT scan  revealed a 4.7 mm stone seen in the left distal ureter with associated hydronephrosis. No mention was made of any renal calculi.   Interval history:  His stone could not be visualized on KUB his last visit. He has been maintained on medical expulsive therapy and has been forcing fluids and strain his urine. He has not seen his stone pass but has not been having any significant pain since I seen him last.   PMH: Past Medical History  Diagnosis Date  . HLD (hyperlipidemia)   . Arnold-Chiari malformation 2002    s/p decompression  . Internal hemorrhoids   . Colon polyps 2011    rpt Q5 yrs  . Seasonal allergies   . OSA on CPAP DR CLANCE  . DDD (degenerative disc disease) SPINE  . Paroxysmal a-fib CARDIOLOGIST-  DR [name]-- LAST VISIT 10-22-2010 IN [facility]    controlled with flecainide 300mg  prn  . 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 Frequency of urination   . Urgency of urination   . Hematuria     PSH: Past Surgical History  Procedure Date  . Adenoidectomy AGE 64  . Cardioversion 1996, 2008    x2 in 1996  . Hiatal hernia repair 1999  . Colonoscopy 2011    polyp, rec rpt 5 yrs  . Ct chest noncontrast 06/2011    f/u L pulm nodule - gone.  Marland Kitchen Anterior lumbar diskectomy / decompression  10-08-2010    L5 - S1, LEFT SIDE  . Suboccipital craniectomy cervical laminectomy 11-30-2000  DR NUDALMAN    C1 AND PARTIAL C2--   CHIARI  MALFORMATION  . Hip arthroscopy 09-23-2011    LEFT HIP    Allergies: No Known Allergies  Medications: Prescriptions prior to admission  Medication Sig Dispense Refill  . gabapentin (NEURONTIN) 600 MG tablet Take 600 mg by  mouth 3 (three) times daily.       Marland Kitchen HYDROcodone-acetaminophen (NORCO) 5-325 MG per tablet as needed.      . naproxen (NAPROSYN) 500 MG tablet Take one po bid x 1 week then prn pain, take with food  60 tablet  0  . Tamsulosin HCl (FLOMAX) 0.4 MG CAPS Take 1 capsule (0.4 mg total) by mouth daily.  30 capsule  0  . celecoxib (CELEBREX) 200 MG capsule Take 200 mg by mouth daily.        . flecainide (TAMBOCOR) 100 MG tablet Take 300 mg by mouth as needed. PT STATES TAKES 3 100MG  TABLETS  WHEN HE FEELS HE IN A-FIB  --- LAST TAKEN IN NOV 2012  (ONLY HAPPENS TWICE A YEAR)         Social History: History   Social History  . Marital Status: Married    Spouse Name: N/A    Number of Children: N/A  . Years of Education: N/A   Occupational History  . Not on file.   Social History Main Topics  . Smoking status: Current Everyday Smoker -- 1.0 packs/day for 20 years    Types: Cigarettes  . Smokeless tobacco: Never Used  . Alcohol Use: No  .  Drug Use: No  . Sexually Active: Not on file   Other Topics Concern  . Not on file   Social History Narrative   Caffeine: 30oz mountain dewLives with wife, 1 daughter, 1 step daughter, 2 dogsOccupation: Art gallery manager for [employer]: no regular exercise, trouble with back painDiet: good, vegetables.    Family History: Family History  Problem Relation Age of Onset  . Hypertension Mother   . Heart attack Mother 73  . Heart disease Mother   . Coronary artery disease Mother 65  . Coronary artery disease Father 46    6v CABG  . Heart disease Father 68    CABG  . Diabetes Father   . Alcohol abuse Father   . Liver disease Other     Grandmother (P or M not specified)  . Cancer Paternal Uncle     lung, leukemia  . Hemochromatosis Cousin     Review of Systems: Genitourinary, constitutional, skin, eye, otolaryngeal, hematologic/lymphatic, cardiovascular, pulmonary, endocrine, musculoskeletal, gastrointestinal,  neurological and psychiatric system(s) were reviewed and pertinent findings if present are noted.     Physical Exam: @VITALS2 @ General: No acute distress.  Awake. Head:  Normocephalic.  Atraumatic. ENT:  EOMI.  Mucous membranes moist Neck:  Supple.  No lymphadenopathy. CV:  S1 present. S2 present. Regular rate. Pulmonary: Equal effort bilaterally.  Clear to auscultation bilaterally. Abdomen: Soft.  Non- tender to palpation.there has been some left CVA tenderness.   Studies:  Recent Labs  Basename 03/02/12 1147   HGB 15.2   WBC --   PLT --    No results found for this basename: NA:2,K:2,CL:2,CO2:2,BUN:2,CREATININE:2,CALCIUM:2,MAGNESIUM:2,GFRNONAA:2,GFRAA:2 in the last 72 hours   No results found for this basename: PT:2,INR:2,APTT:2 in the last 72 hours   No components found with this basename: ABG:2    Assessment:  Symptomatic 5 mm left distal ureteral stone. This has been assessed and treated by Dr. Vernie Ammons in the past.  Plan: 1. I will proceed with left ureteroscopy.  2. The patient has been scheduled by Dr. Vernie Ammons for this. Dr. Vernie Ammons is out of town, and the patient wanted to move this up. I will accommodate the patient.    3. The patient is aware of risks and complications of the procedure which include but are not limited to infection, bleeding, obstruction of the left kidney, the need for a double-J stent, anesthetic complications, and desires to proceed.

## 2012-03-03 NOTE — Anesthesia Postprocedure Evaluation (Signed)
Anesthesia Post Note  Patient: [name]  Procedure(s) Performed: Procedure(s) (LRB): URETEROSCOPY (Left)  Anesthesia type: General  Patient location: PACU  Post pain: Pain level controlled  Post assessment: Post-op Vital signs reviewed  Last Vitals:  Filed Vitals:   03/02/12 1542  BP: 137/91  Pulse: 56  Temp: 36.1 C  Resp: 16    Post vital signs: Reviewed  Level of consciousness: sedated  Complications: No apparent anesthesia complications

## 2012-03-03 NOTE — Progress Notes (Signed)
Patient having some discomfort taking pain medication.  Suggested to patient to stop working in the yard and lay down to allow medication to help and if no relief or if started to run fever to touch base with surgeon.

## 2012-03-06 ENCOUNTER — Encounter (HOSPITAL_BASED_OUTPATIENT_CLINIC_OR_DEPARTMENT_OTHER): Payer: Self-pay | Admitting: Urology

## 2012-03-08 ENCOUNTER — Encounter: Payer: Self-pay | Admitting: Pulmonary Disease

## 2012-03-08 ENCOUNTER — Ambulatory Visit (INDEPENDENT_AMBULATORY_CARE_PROVIDER_SITE_OTHER): Payer: BC Managed Care – PPO | Admitting: Pulmonary Disease

## 2012-03-08 VITALS — BP 130/78 | HR 83 | Temp 98.2°F | Ht 75.0 in | Wt 284.8 lb

## 2012-03-08 DIAGNOSIS — G4733 Obstructive sleep apnea (adult) (pediatric): Secondary | ICD-10-CM

## 2012-03-08 NOTE — Patient Instructions (Addendum)
Will send an order to your dme to get you a heated humidifier for your cpap machine. Keep up with changes of mask seals every 2-3 mos. Work on weight loss If doing well, will see you back in one year.

## 2012-03-08 NOTE — Assessment & Plan Note (Signed)
The patient is wearing CPAP compliantly by his download, and his AHI is well-controlled.  He feels that he is sleeping well, and denies any significant daytime sleepiness.  He is having mask leaks by his download, however he needs to change the seals on his mask more often.  He also needs to get a heated humidifier for his CPAP machine.  This will help him with dryness.  Finally, I have encouraged him to work aggressively on weight loss.

## 2012-03-08 NOTE — Progress Notes (Signed)
  Subjective:    Patient ID: [name], male    DOB: November 07, 1970, 41 y.o.   MRN: [id number]  HPI The patient consented for followup of his known obstructive sleep apnea.  He is wearing CPAP compliantly by his download, and his AHI is well-controlled.  He is having excessive mask leak, and the patient tells me that he has not been changing the seals on his CPAP mask very often.  He is also complaining of mouth dryness, but tells me that he elected not to give it a heated humidifier for his new CPAP machine.  He feels that his CPAP has helped his sleep and also his daytime alertness.  He denies any significant daytime sleepiness.   Review of Systems  Constitutional: Negative.  Negative for fever and unexpected weight change.  HENT: Negative.  Negative for ear pain, nosebleeds, congestion, sore throat, rhinorrhea, sneezing, trouble swallowing, dental problem, postnasal drip and sinus pressure.   Eyes: Negative.  Negative for redness and itching.  Respiratory: Negative.  Negative for cough, chest tightness, shortness of breath and wheezing.   Cardiovascular: Negative.  Negative for palpitations and leg swelling.  Gastrointestinal: Negative.  Negative for nausea and vomiting.  Genitourinary: Negative.  Negative for dysuria.  Musculoskeletal: Negative.  Negative for joint swelling.  Skin: Negative.  Negative for rash.  Neurological: Negative.  Negative for headaches.  Hematological: Negative.  Does not bruise/bleed easily.  Psychiatric/Behavioral: Negative.  Negative for dysphoric mood. The patient is not nervous/anxious.        Objective:   Physical Exam Overweight male in no acute distress No skin breakdown or pressure necrosis from the CPAP mask Lower extremities without edema, no cyanosis Appears alert, is not overly sleepy, moves all 4 extremities.       Assessment & Plan:

## 2012-03-30 ENCOUNTER — Encounter: Payer: Self-pay | Admitting: Family Medicine

## 2012-04-14 ENCOUNTER — Telehealth: Payer: Self-pay | Admitting: Family Medicine

## 2012-04-14 NOTE — Telephone Encounter (Signed)
Caller: [name]/Patient; PCP: Eustaquio Boyden; CB#: [phone];  Call regarding Fever With Abd Cramps that started 04/11/12 and he had some diarrhea.   He took 1 800 mg Ibuprofen and has swelling under his nose and on his ips.  Then had hives that started on his back.   He took Benadryl 2 tebs of  25mg   at about 2300 and now the hives are better, but he still has the swelling under his nose and on his lips. Afebrile this AM. Triaged Face Pain or Sweling and all emergent SX R/O.  Pt still has swelling on his face not improved with Benadryl.  Disp = needs tro be seen in 24 hrs.  Per Nsg Judgement pt needs to be seen today, early for facial swelling. Pt advised to not take any more Ibuprofen.  May take more Benadryl, 50 mg.  NO APPTS SEEN IN [facility] BEFORE 1200 FOR ANY PROVIDER .  PLEASE CALL PT TO ADVISE.  If SX get worse will need to be seen in E/R or call 911.

## 2012-04-14 NOTE — Telephone Encounter (Signed)
plz schedule appt with [place], if none available rec to [facility] to be evaluated.

## 2012-04-14 NOTE — Telephone Encounter (Signed)
Spoke with patient and he states his swelling is better, not all the way down but better. Pt also states that he's still having diarrhea,  and abdominal pain only when the diarrhea starts. I advised we don't have any appts available and he would need to see an urgent care center. Per pt he will go to UC in the morning if he's not better, pt also scheduled appt on Monday 04/17/12 @ 3:30 with Dr.G as a back up if he doesn't go to the UC.

## 2012-04-14 NOTE — Telephone Encounter (Signed)
Noted. Thanks.

## 2012-04-17 ENCOUNTER — Telehealth: Payer: Self-pay | Admitting: Family Medicine

## 2012-04-17 NOTE — Telephone Encounter (Signed)
Patient came into office to cancel an appointment for today due to the office having problems with the phones.  I explained that we were having phone service issues last week and today and apologized that he had trouble with the phones.  The patient explained that he called in on Friday and was offered an 11:30 appointment and said that he told the office that he wanted an earlier appointment in the day and that someone said they were working on it.  He said that he did not take the 11:30 appointment and that he should have because then he was called back and told to go to Urgent Care.  He said he scheduled an appointment for today at 3:30 as back-up.  He was coming in today to cancel the 3:30 because he was feeling better and when the scheduler went to cancel the appointment he was told that there was not a 3:30 appointment for him.    I researched and our CMA did offer him appointments and advised him to go to urgent care over the weekend.  It appears that a 3:30 on Monday was discussed and that she scheduled it.  However, there is no 3:30 in the schedule for him.  When discussing with the CMA further, she recalls scheduling the appointment and that the doctor was aware that she was going to schedule a 3:30 appointment on Monday.  We are researching the 3:30 appointment slot and why it was not in the schedule for the patient. The patient's main concern was that he would be able to get an appointment with his physician when he needed to see him.  We talked again about how he was offered an 11:30 same day and that I am sure we were trying to get him an earlier appointment but may not have had one available.  I also assured him I would research and give him a call back.       I called the patient back to follow-up and left him a voice mail asking him to return my call in order for me to share what I had found in the phone notes and scheduling and with talking with the CMA.  I also apologized again for any  inconvenience and asked how I may assist further.

## 2012-04-17 NOTE — Telephone Encounter (Signed)
I'm sorry but I know I scheduled that appointment, if I can do anything or speak with the pt then I will.

## 2012-06-21 ENCOUNTER — Other Ambulatory Visit: Payer: Self-pay | Admitting: Internal Medicine

## 2012-06-22 MED ORDER — FLECAINIDE ACETATE 100 MG PO TABS: 300.0000 mg | ORAL_TABLET | ORAL | Status: DC | PRN

## 2012-06-22 NOTE — Telephone Encounter (Signed)
I spoke with pt he is doing OK he states he only needs a few tablets because he only takes med as needed .

## 2012-07-04 ENCOUNTER — Other Ambulatory Visit: Payer: Self-pay | Admitting: Internal Medicine

## 2012-07-04 NOTE — Telephone Encounter (Signed)
Refill- Flecanide  Pt flecanide RX sent to verified preferred pharmacy 10/3.  Pt states pharmacy told him they never received refill info from Dr. [name], please research and get back with patient.

## 2012-07-06 MED ORDER — FLECAINIDE ACETATE 100 MG PO TABS: 300.0000 mg | ORAL_TABLET | ORAL | Status: DC | PRN

## 2012-07-14 ENCOUNTER — Telehealth: Payer: Self-pay | Admitting: Internal Medicine

## 2012-07-14 MED ORDER — FLECAINIDE ACETATE 100 MG PO TABS: 300.0000 mg | ORAL_TABLET | ORAL | Status: DC | PRN

## 2012-07-14 NOTE — Telephone Encounter (Signed)
New Problem:    Patient called in needing a refill of his flecainide (TAMBOCOR) 100 MG tablet.  Please call back once the order has been placed.  [facility] continues to claim hat they never receive his request.

## 2012-11-29 ENCOUNTER — Encounter: Payer: Self-pay | Admitting: Family Medicine

## 2012-11-29 ENCOUNTER — Ambulatory Visit (INDEPENDENT_AMBULATORY_CARE_PROVIDER_SITE_OTHER): Payer: BC Managed Care – PPO | Admitting: Family Medicine

## 2012-11-29 VITALS — BP 130/80 | HR 83 | Temp 98.2°F | Ht 75.0 in | Wt 282.5 lb

## 2012-11-29 DIAGNOSIS — R0781 Pleurodynia: Secondary | ICD-10-CM

## 2012-11-29 DIAGNOSIS — R079 Chest pain, unspecified: Secondary | ICD-10-CM

## 2012-11-30 NOTE — Progress Notes (Signed)
Nature conservation officer at [facility] [address] [place] [address] Phone: [phone] Fax: [phone]  Date:  11/29/2012   Name:  [name]   DOB:  1971-09-12   MRN:  [id number] Gender: male Age: 42 y.o.  Primary Physician:  Eustaquio Boyden, MD  Evaluating MD: Hannah Beat, MD   Chief Complaint: ? RIB PAIN FROM FALL   History of Present Illness:  [name] is a 42 y.o. pleasant patient who presents with the following:  Fell on the ice when trying to walk to his parent's house to check on them in the dark. Now with significant R sided rib pain. No bruising. Pain with a deep breath and coughing.  Patient Active Problem List  Diagnosis  . HYPERLIPIDEMIA  . OBSTRUCTIVE SLEEP APNEA  . ATRIAL FIBRILLATION, PAROXYSMAL  . GERD  . BACK PAIN, THORACIC REGION  . LOW BACK PAIN SYNDROME  . HEADACHE  . DEGENERATIVE DISC DISEASE, LUMBOSACRAL SPINE W/RADICULOPATHY  . Left groin pain  . Sinusitis  . Smoker  . Preoperative clearance  . Darkening of skin  . Kidney stones    Past Medical History  Diagnosis Date  . HLD (hyperlipidemia)   . Arnold-Chiari malformation 2002    s/p decompression  . Internal hemorrhoids   . Colon polyps 2011    rpt Q5 yrs  . Seasonal allergies   . OSA on CPAP DR CLANCE  . DDD (degenerative disc disease) SPINE  . Paroxysmal a-fib CARDIOLOGIST-  DR [name]-- LAST VISIT 10-22-2010 IN [facility]    controlled with flecainide 300mg  prn  . 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 Kidney stone 02/2012    calcium oxalate, removed by urology    Past Surgical History  Procedure Laterality Date  . Adenoidectomy  AGE 56  . Cardioversion  1996, 2008    x2 in 1996  . Hiatal hernia repair  1999  . Colonoscopy  2011    polyp, rec rpt 5 yrs  . Ct chest noncontrast  06/2011    f/u L pulm nodule - gone.  Marland Kitchen Anterior lumbar diskectomy / decompression   10-08-2010    L5 - S1, LEFT SIDE  . Suboccipital craniectomy cervical laminectomy   11-30-2000  DR NUDALMAN    C1 AND PARTIAL C2--   CHIARI  MALFORMATION  . Hip arthroscopy  09-23-2011    LEFT HIP  . Ureteroscopy  03/02/2012    Procedure: URETEROSCOPY;  Surgeon: Marcine Matar, MD;  Location: [facility];  Service: Urology;  Laterality: Left;  stone obtained    History   Social History  . Marital Status: Married    Spouse Name: N/A    Number of Children: N/A  . Years of Education: N/A   Occupational History  . Not on file.   Social History Main Topics  . Smoking status: Current Every Day Smoker -- 1.00 packs/day for 20 years    Types: Cigarettes  . Smokeless tobacco: Never Used  . Alcohol Use: No  . Drug Use: No  . Sexually Active: Not on file   Other Topics Concern  . Not on file   Social History Narrative   Caffeine: 30oz mountain dew   Lives with wife, 1 daughter, 1 step daughter, 2 dogs   Occupation: Art gallery manager for [employer]   Activity: no regular exercise, trouble with back pain   Diet: good, vegetables.    Family History  Problem Relation  Age of Onset  . Hypertension Mother   . Heart attack Mother 92  . Heart disease Mother   . Coronary artery disease Mother 32  . Coronary artery disease Father 48    6v CABG  . Heart disease Father 27    CABG  . Diabetes Father   . Alcohol abuse Father   . Liver disease Other     Grandmother (P or M not specified)  . Cancer Paternal Uncle     lung, leukemia  . Hemochromatosis Cousin     No Known Allergies  Medication list has been reviewed and updated.  Outpatient Prescriptions Prior to Visit  Medication Sig Dispense Refill  . celecoxib (CELEBREX) 200 MG capsule Take 200 mg by mouth daily.        . flecainide (TAMBOCOR) 100 MG tablet Take 3 tablets (300 mg total) by mouth as needed.  30 tablet  0  . gabapentin (NEURONTIN) 600 MG tablet Take 600 mg by mouth 3 (three) times daily.       Marland Kitchen HYDROcodone-acetaminophen (NORCO) 5-325 MG per tablet as needed.        No facility-administered medications prior to visit.    Review of Systems:   GEN: No fevers, chills. Nontoxic. Primarily MSK c/o today. MSK: Detailed in the HPI GI: tolerating PO intake without difficulty Neuro: No numbness, parasthesias, or tingling associated. Otherwise the pertinent positives of the ROS are noted above.    Physical Examination: BP 130/80  Pulse 83  Temp(Src) 98.2 F (36.8 C) (Oral)  Ht 6\' 3"  (1.905 m)  Wt 282 lb 8 oz (128.141 kg)  BMI 35.31 kg/m2  SpO2 97%  Ideal Body Weight: Weight in (lb) to have BMI = 25: 199.6   GEN: WDWN, NAD, Non-toxic, Alert & Oriented x 3 HEENT: Atraumatic, Normocephalic.  Chest wall: Markedly TTP around rib 8 on the right side laterally. Mild pain with sternal compression. Ears and Nose: No external deformity. EXTR: No clubbing/cyanosis/edema NEURO: Normal gait.  PSYCH: Normally interactive. Conversant. Not depressed or anxious appearing.  Calm demeanor.    Assessment and Plan: Rib pain on right side   C/w probable rib fx Alteration of activity discussed  OK to use more percocet if needed while healing - expect 4 weeks or more to heal  Signed, Kaity Pitstick T. Aditri Louischarles, MD 11/30/2012 10:01 AM

## 2013-03-09 ENCOUNTER — Ambulatory Visit: Payer: BC Managed Care – PPO | Admitting: Pulmonary Disease

## 2013-03-22 ENCOUNTER — Encounter: Payer: Self-pay | Admitting: Family Medicine

## 2013-03-29 ENCOUNTER — Ambulatory Visit (INDEPENDENT_AMBULATORY_CARE_PROVIDER_SITE_OTHER): Payer: BC Managed Care – PPO | Admitting: Pulmonary Disease

## 2013-03-29 ENCOUNTER — Encounter: Payer: Self-pay | Admitting: Pulmonary Disease

## 2013-03-29 VITALS — BP 128/86 | HR 76 | Temp 97.9°F | Ht 75.0 in | Wt 287.6 lb

## 2013-03-29 DIAGNOSIS — G4733 Obstructive sleep apnea (adult) (pediatric): Secondary | ICD-10-CM

## 2013-03-29 NOTE — Patient Instructions (Addendum)
Stay on cpap, and keep up with mask changes and supplies. Work on weight loss followup with me in one year 

## 2013-03-29 NOTE — Assessment & Plan Note (Signed)
The patient is wearing CPAP compliantly by his download, with good control of his AHI.  He is having some mask leak because of tossing and turning related to his musculoskeletal discomfort.  When he is not moving, he feels the mask seals very well.  I have asked him to keep up with his mask changes and supplies, and also to work more aggressively on weight loss.  See him back in one year if doing well.

## 2013-03-29 NOTE — Progress Notes (Signed)
  Subjective:    Patient ID: [name], male    DOB: May 09, 1971, 42 y.o.   MRN: [id number]  HPI The pt comes in today for f/u of his osa.  He is wearing cpap compliantly by his download, and his AHI is well controlled.  He does have mask leak, but the pt tells me this is occurring when he tosses and turns from his MSK issues and causes a break in the seal.  He feels he sleeps as well as possible with the device.   Weight is increased from last year.    Review of Systems  Constitutional: Negative for fever and unexpected weight change.  HENT: Negative for ear pain, nosebleeds, congestion, sore throat, rhinorrhea, sneezing, trouble swallowing, dental problem, postnasal drip and sinus pressure.   Eyes: Negative for redness and itching.  Respiratory: Negative for cough, chest tightness, shortness of breath and wheezing.   Cardiovascular: Negative for palpitations and leg swelling.  Gastrointestinal: Negative for nausea and vomiting.  Genitourinary: Negative for dysuria.  Musculoskeletal: Negative for joint swelling.  Skin: Negative for rash.  Neurological: Negative for headaches.  Hematological: Does not bruise/bleed easily.  Psychiatric/Behavioral: Negative for dysphoric mood. The patient is not nervous/anxious.        Objective:   Physical Exam Ow male in nad Nose without purulence or discharge noted. No skin breakdown or pressure necrosis from cpap mask. Neck without LN or TMG LE without edema, no cyanosis Alert, does not appear overly sleepy, moves all 4.        Assessment & Plan:

## 2013-04-05 ENCOUNTER — Encounter: Payer: Self-pay | Admitting: Family Medicine

## 2013-04-16 ENCOUNTER — Ambulatory Visit (INDEPENDENT_AMBULATORY_CARE_PROVIDER_SITE_OTHER): Payer: BC Managed Care – PPO | Admitting: Family Medicine

## 2013-04-16 ENCOUNTER — Encounter: Payer: Self-pay | Admitting: Family Medicine

## 2013-04-16 VITALS — BP 144/78 | HR 100 | Temp 99.1°F | Wt 278.5 lb

## 2013-04-16 DIAGNOSIS — F17200 Nicotine dependence, unspecified, uncomplicated: Secondary | ICD-10-CM

## 2013-04-16 DIAGNOSIS — J069 Acute upper respiratory infection, unspecified: Secondary | ICD-10-CM | POA: Insufficient documentation

## 2013-04-16 DIAGNOSIS — H109 Unspecified conjunctivitis: Secondary | ICD-10-CM | POA: Insufficient documentation

## 2013-04-16 DIAGNOSIS — J029 Acute pharyngitis, unspecified: Secondary | ICD-10-CM

## 2013-04-16 MED ORDER — ERYTHROMYCIN 5 MG/GM OP OINT: TOPICAL_OINTMENT | Freq: Three times a day (TID) | OPHTHALMIC | Status: DC

## 2013-04-16 NOTE — Patient Instructions (Signed)
I think you have upper respiratory infection along with pink eye. Push fluids and plenty of rest. Salt water gargles. Suck on cold things like popsicles or warm things like herbal teas (whichever soothes the throat better). Honey with lemon can sooth the throat. May use erythromycin ointment for both eyes Please return to see [place] if not improving as expected , or any worsening, such as fever> 101.5, or worsening eye pain or swelling.  Conjunctivitis Conjunctivitis is commonly called "pink eye." Conjunctivitis can be caused by bacterial or viral infection, allergies, or injuries. There is usually redness of the lining of the eye, itching, discomfort, and sometimes discharge. There may be deposits of matter along the eyelids. A viral infection usually causes a watery discharge, while a bacterial infection causes a yellowish, thick discharge. Pink eye is very contagious and spreads by direct contact. You may be given antibiotic eyedrops as part of your treatment. Before using your eye medicine, remove all drainage from the eye by washing gently with warm water and cotton balls. Continue to use the medication until you have awakened 2 mornings in a row without discharge from the eye. Do not rub your eye. This increases the irritation and helps spread infection. Use separate towels from other household members. Wash your hands with soap and water before and after touching your eyes. Use cold compresses to reduce pain and sunglasses to relieve irritation from light. Do not wear contact lenses or wear eye makeup until the infection is gone. SEEK MEDICAL CARE IF:   Your symptoms are not better after 3 days of treatment.  You have increased pain or trouble seeing.  The outer eyelids become very red or swollen. Document Released: 10/14/2004 Document Revised: 11/29/2011 Document Reviewed: 09/06/2005 [facility] Patient Information 2014 [place], [place].

## 2013-04-16 NOTE — Progress Notes (Signed)
  Subjective:    Patient ID: [name], male    DOB: Feb 01, 1971, 42 y.o.   MRN: [id number]  HPI CC: R eye pain, sore throat  5d h/o R eye pain/redness/swelling.  Now redness has spread to left eye.  + drainage from bilat eyes as well as am matting. 2d h/o sore throat, subjective fever/chills. + headaches, and worsening of chronic arthralgias. Vision blurry on right side.  R eye staying sore but no pain with eye movements.  + swelling and tenderness of R periorbital region.  No abd pain, nausea, coughing, tooth pain, PNdrainage.  No photophobia.  Used daughter's antibiotic eye drops (polymyx/trim).  Daughter recently dx with pink eye.  Wife with sinus /cough infection 1 wk ago.  Granddaughters sick recently, one with strep. Taking percocet 7.5/325 which helps with fever.  Also on celebrex nightly. 1 ppd smoker.  Considering lumbar fusion with Dr. Yevette Edwards.  Constant pain down bilateral legs.  Past Medical History  Diagnosis Date  . HLD (hyperlipidemia)   . Arnold-Chiari malformation 2002    s/p decompression  . Internal hemorrhoids   . Colon polyps 2011    rpt Q5 yrs  . Seasonal allergies   . OSA on CPAP DR CLANCE  . DDD (degenerative disc disease) SPINE  . Paroxysmal a-fib CARDIOLOGIST-  DR [name]-- LAST VISIT 10-22-2010 IN [facility]    controlled with flecainide 300mg  prn  . 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 Kidney stone 02/2012    calcium oxalate, removed by urology     Review of Systems Per HPI    Objective:   Physical Exam  Nursing note and vitals reviewed. Constitutional: He appears well-developed and well-nourished. No distress.  HENT:  Head: Normocephalic and atraumatic.  Right Ear: Hearing, tympanic membrane, external ear and ear canal normal.  Left Ear: Hearing, tympanic membrane, external ear and ear canal normal.  Nose: Mucosal edema and rhinorrhea present. Right sinus exhibits maxillary sinus tenderness. Right sinus exhibits no frontal sinus  tenderness. Left sinus exhibits no maxillary sinus tenderness and no frontal sinus tenderness.  Mouth/Throat: Uvula is midline and mucous membranes are normal. Posterior oropharyngeal edema and posterior oropharyngeal erythema present. No oropharyngeal exudate or tonsillar abscesses.  Eyes: EOM are normal. Pupils are equal, round, and reactive to light. Right eye exhibits discharge. Left eye exhibits discharge. Right conjunctiva is injected. Left conjunctiva is injected. No scleral icterus.  Diffuse bulbar and palpebral conjunctivitis R>L eyes. Intact EOM without pain. No photophobia noted today. PERRLA  Neck: Normal range of motion. Neck supple.  Cardiovascular: Normal rate, regular rhythm, normal heart sounds and intact distal pulses.   No murmur heard. Pulmonary/Chest: Effort normal and breath sounds normal. No respiratory distress. He has no wheezes. He has no rales.  Lymphadenopathy:    He has cervical adenopathy (R AC LAD).       Assessment & Plan:

## 2013-04-16 NOTE — Assessment & Plan Note (Signed)
Encouraged cessation.

## 2013-04-16 NOTE — Assessment & Plan Note (Signed)
Marked conjunctivitis of both eyes.  Anticipate viral given associated sxs. No evidence of orbital cellulitis or preseptal cellulitis - so will treat supportively and with topical abx. Red flags to return discussed.

## 2013-04-16 NOTE — Assessment & Plan Note (Signed)
See above. Treat supportively as per instructions. To return if not improving as expected. Pt agrees with plan.

## 2013-09-11 ENCOUNTER — Telehealth: Payer: Self-pay | Admitting: Medical Oncology

## 2013-09-11 NOTE — Telephone Encounter (Signed)
erroneous

## 2013-10-26 ENCOUNTER — Ambulatory Visit (INDEPENDENT_AMBULATORY_CARE_PROVIDER_SITE_OTHER): Payer: BC Managed Care – PPO | Admitting: Family Medicine

## 2013-10-26 ENCOUNTER — Ambulatory Visit (INDEPENDENT_AMBULATORY_CARE_PROVIDER_SITE_OTHER)
Admission: RE | Admit: 2013-10-26 | Discharge: 2013-10-26 | Disposition: A | Discharge status: Home / Self Care | Payer: BC Managed Care – PPO | Source: Ambulatory Visit | Attending: Family Medicine | Admitting: Family Medicine

## 2013-10-26 ENCOUNTER — Encounter: Payer: Self-pay | Admitting: Family Medicine

## 2013-10-26 VITALS — BP 140/90 | HR 88 | Temp 98.3°F | Wt 293.5 lb

## 2013-10-26 DIAGNOSIS — R109 Unspecified abdominal pain: Secondary | ICD-10-CM

## 2013-10-26 DIAGNOSIS — R319 Hematuria, unspecified: Secondary | ICD-10-CM

## 2013-10-26 LAB — POCT URINALYSIS DIPSTICK
BILIRUBIN UA: NEGATIVE
Blood, UA: NEGATIVE
GLUCOSE UA: NEGATIVE
KETONES UA: NEGATIVE
LEUKOCYTES UA: NEGATIVE
NITRITE UA: NEGATIVE
PH UA: 6
Protein, UA: NEGATIVE
Spec Grav, UA: >=1.03
Urobilinogen, UA: 0.2

## 2013-10-26 MED ORDER — TAMSULOSIN HCL 0.4 MG PO CAPS: 0.4000 mg | ORAL_CAPSULE | Freq: Every day | ORAL | Status: DC

## 2013-10-26 MED ORDER — AMITRIPTYLINE HCL 25 MG PO TABS: 25.0000 mg | ORAL_TABLET | Freq: Every day | ORAL | Status: DC

## 2013-10-26 NOTE — Progress Notes (Signed)
BP 140/90  Pulse 88  Temp(Src) 98.3 F (36.8 C) (Oral)  Wt 293 lb 8 oz (133.131 kg)   CC: ?kidney stone  Subjective:    Patient ID: [name], male    DOB: 11/14/1970, 43 y.o.   MRN: [id number]  HPI: [name] is a 43 y.o. 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 Pt has noticed increasing pain in left flank and left abdomen.  Endorses several week history urgency, incomplete emptying.   No hematuria, dysuria.  No fevers/chills, nausea.  H/o recurrent kidney stones, last was 02/2012 and needed lithotripsy. H/o chronic lumbar pain s/p surgery.    Yesterday on drive to work felt LLQ pain with all bumps on the road.  No diarrhea/constipaiton or bowel changes.  No blood in stool.   Past Medical History  Diagnosis Date  . HLD (hyperlipidemia)   . Arnold-Chiari malformation 2002    s/p decompression  . Internal hemorrhoids   . Colon polyps 2011    rpt Q5 yrs  . Seasonal allergies   . OSA on CPAP DR CLANCE  . DDD (degenerative disc disease) SPINE  . Paroxysmal a-fib CARDIOLOGIST-  DR Graciela HusbandsKLEIN-- LAST VISIT 10-22-2010 IN [facility]    controlled with flecainide 300mg  prn  . 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 Kidney stone 02/2012    calcium oxalate, removed by urology    Past Surgical History  Procedure Laterality Date  . Adenoidectomy  AGE 67  . Cardioversion  1996, 2008    x2 in 1996  . Hiatal hernia repair  1999  . Colonoscopy  2011    polyp, rec rpt 5 yrs  . Ct chest noncontrast  06/2011    f/u L pulm nodule - gone.  Marland Kitchen. Anterior lumbar diskectomy / decompression   10-08-2010    L5 - S1, LEFT SIDE ([place])  . Suboccipital craniectomy cervical laminectomy  11-30-2000  DR NUDALMAN    C1 AND PARTIAL C2--   CHIARI  MALFORMATION  . Hip arthroscopy  09-23-2011    LEFT HIP  . Ureteroscopy  03/02/2012    Procedure: URETEROSCOPY;  Surgeon: Marcine MatarStephen Dahlstedt, MD;  [facility];  Service: Urology;  Laterality: Left;  stone obtained    Relevant past medical, surgical, family and social history reviewed and updated. Allergies and medications reviewed and updated. Current Outpatient Prescriptions on File Prior to Visit  Medication Sig  . celecoxib (CELEBREX) 200 MG capsule Take 200 mg by mouth daily.    . flecainide (TAMBOCOR) 100 MG tablet Take 3 tablets (300 mg total) by mouth as needed.  . gabapentin (NEURONTIN) 600 MG tablet Take 600 mg by mouth 3 (three) times daily.   Marland Kitchen. oxyCODONE-acetaminophen (PERCOCET) 7.5-325 MG per tablet Take 1 tablet by mouth every 4 (four) hours as needed for pain.   No current facility-administered medications on file prior to visit.    Review of Systems Per HPI unless specifically indicated above    Objective:    BP 140/90  Pulse 88  Temp(Src) 98.3 F (36.8 C) (Oral)  Wt 293 lb 8 oz (133.131 kg)  Physical Exam  Nursing note and vitals reviewed. Constitutional: He appears well-developed and well-nourished. No distress.  Abdominal: Soft. Normal appearance and bowel sounds are normal. He exhibits no distension and no mass. There is no hepatosplenomegaly. There is no tenderness. There is CVA tenderness (  bilateral). There is no rigidity, no rebound, no guarding and negative Murphy's sign.   Results for orders placed in visit on 10/26/13  POCT URINALYSIS DIPSTICK      Result Value Range   Color, UA Yellow     Clarity, UA Clear     Glucose, UA Negative     Bilirubin, UA Negative     Ketones, UA Negative     Spec Grav, UA >=1.030     Blood, UA Negative     pH, UA 6.0     Protein, UA Negative     Urobilinogen, UA 0.2     Nitrite, UA Negative     Leukocytes, UA Negative        Assessment & Plan:   Problem List Items Addressed This Visit   Left flank pain - Primary     UA concentrated but otherwise normal. Check KUB - no kidney stone appreciated. Sent in flomax to use prn but anticipate current pain more  from chronic lower back pain. Pt agrees with plan - will return to see Dr. Shon Baton and Merlyn Albert, as well as Dr. Vear Clock.    Relevant Orders      DG Abd 1 View    Other Visit Diagnoses   Hematuria        Relevant Orders       POCT Urinalysis Dipstick (Completed)        Follow up plan: Return as needed.

## 2013-10-26 NOTE — Progress Notes (Signed)
Pre-visit discussion using our clinic review tool. No additional management support is needed unless otherwise documented below in the visit note.  

## 2013-10-26 NOTE — Patient Instructions (Addendum)
Increase water. Let's try low dose amitriptyline 25mg  nightly Urine looking ok today. Xray today - looking ok today. Good to see you today. May use flomax as needed.

## 2013-10-26 NOTE — Assessment & Plan Note (Signed)
UA concentrated but otherwise normal. Check KUB - no kidney stone appreciated. Sent in flomax to use prn but anticipate current pain more from chronic lower back pain. Pt agrees with plan - will return to see Dr. Shon BatonBrooks and Merlyn Albertumonksi, as well as Dr. Vear ClockPhillips.

## 2013-11-22 ENCOUNTER — Encounter: Payer: Self-pay | Admitting: Internal Medicine

## 2013-11-22 ENCOUNTER — Ambulatory Visit (INDEPENDENT_AMBULATORY_CARE_PROVIDER_SITE_OTHER): Payer: BC Managed Care – PPO | Admitting: Internal Medicine

## 2013-11-22 VITALS — BP 132/78 | HR 89 | Temp 98.9°F | Wt 282.0 lb

## 2013-11-22 DIAGNOSIS — R0989 Other specified symptoms and signs involving the circulatory and respiratory systems: Secondary | ICD-10-CM

## 2013-11-22 DIAGNOSIS — R059 Cough, unspecified: Secondary | ICD-10-CM

## 2013-11-22 DIAGNOSIS — J3489 Other specified disorders of nose and nasal sinuses: Secondary | ICD-10-CM

## 2013-11-22 DIAGNOSIS — R05 Cough: Secondary | ICD-10-CM

## 2013-11-22 DIAGNOSIS — J029 Acute pharyngitis, unspecified: Secondary | ICD-10-CM

## 2013-11-22 DIAGNOSIS — R509 Fever, unspecified: Secondary | ICD-10-CM

## 2013-11-22 DIAGNOSIS — R51 Headache: Secondary | ICD-10-CM

## 2013-11-22 NOTE — Progress Notes (Signed)
HPI  Pt presents to the clinic today with c/o cough, headache, fever and body aches. He reports this started about 3 days ago. He has been running fevers up to 103 last night. The cough is non productive. He does have some associated runny nose and nasal congestion. Clear mucous is coming out. -He has taken tylenol and allegra OTC without much relief. He has not had sick contacts that he is aware of.  Review of Systems      Past Medical History  Diagnosis Date  . HLD (hyperlipidemia)   . Arnold-Chiari malformation 2002    s/p decompression  . Internal hemorrhoids   . Colon polyps 2011    rpt Q5 yrs  . Seasonal allergies   . OSA on CPAP DR CLANCE  . DDD (degenerative disc disease) SPINE  . Paroxysmal a-fib CARDIOLOGIST-  DR Graciela HusbandsKLEIN-- LAST VISIT 10-22-2010 IN [facility]    controlled with flecainide 300mg  prn  . 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 Kidney stone 02/2012    calcium oxalate, removed by urology   Allergies  Allergen Reactions  . Ibuprofen Swelling   Current Outpatient Prescriptions on File Prior to Visit  Medication Sig Dispense Refill  . celecoxib (CELEBREX) 200 MG capsule Take 200 mg by mouth daily.        . flecainide (TAMBOCOR) 100 MG tablet Take 3 tablets (300 mg total) by mouth as needed.  30 tablet  0  . gabapentin (NEURONTIN) 600 MG tablet Take 600 mg by mouth 3 (three) times daily.       Marland Kitchen. oxyCODONE-acetaminophen (PERCOCET) 7.5-325 MG per tablet Take 1 tablet by mouth every 4 (four) hours as needed for pain.       No current facility-administered medications on file prior to visit.      Constitutional: Positive headache, fatigue and fever. Denies abrupt weight changes.  HEENT:  Positive runny nose, sore throat. Denies eye redness, eye pain, pressure behind the eyes, facial pain, ear pain, ringing in the ears, wax buildup, or bloody nose. Respiratory: Positive cough. Denies difficulty breathing or shortness of breath.  Cardiovascular: Denies  chest pain, chest tightness, palpitations or swelling in the hands or feet.   No other specific complaints in a complete review of systems (except as listed in HPI above).  Objective:  BP 132/78  Pulse 89  Temp(Src) 98.9 F (37.2 C) (Oral)  Wt 282 lb (127.914 kg)  SpO2 98%    General: Appears his stated age, ill appearing in NAD. HEENT: Head: normal shape and size; Eyes: sclera white, no icterus, conjunctiva pink, PERRLA and EOMs intact; Ears: Tm's gray and intact, normal light reflex; Nose: mucosa pink and moist, septum midline; Throat/Mouth: + PND. Teeth present, mucosa erythematous and moist, no exudate noted, no lesions or ulcerations noted.  Neck: Neck supple, trachea midline. No massses, lumps or thyromegaly present.  Cardiovascular: Normal rate and rhythm. S1,S2 noted.  No murmur, rubs or gallops noted. No JVD or BLE edema. No carotid bruits noted. Pulmonary/Chest: Normal effort and positive vesicular breath sounds. No respiratory distress. No wheezes, rales or ronchi noted.      Assessment & Plan:   Upper Respiratory Infection, likely viral at this point:  Get some rest and drink plenty of water Do salt water gargles for the sore throat Rapid Flu: negative Supportive care at this time.  RTC as needed or if symptoms persist.

## 2013-11-22 NOTE — Progress Notes (Signed)
Pre visit review using our clinic review tool, if applicable. No additional management support is needed unless otherwise documented below in the visit note. 

## 2013-11-22 NOTE — Patient Instructions (Signed)
Viral Infections °A virus is a type of germ. Viruses can cause: °· Minor sore throats. °· Aches and pains. °· Headaches. °· Runny nose. °· Rashes. °· Watery eyes. °· Tiredness. °· Coughs. °· Loss of appetite. °· Feeling sick to your stomach (nausea). °· Throwing up (vomiting). °· Watery poop (diarrhea). °HOME CARE  °· Only take medicines as told by your doctor. °· Drink enough water and fluids to keep your pee (urine) clear or pale yellow. Sports drinks are a good choice. °· Get plenty of rest and eat healthy. Soups and broths with crackers or rice are fine. °GET HELP RIGHT AWAY IF:  °· You have a very bad headache. °· You have shortness of breath. °· You have chest pain or neck pain. °· You have an unusual rash. °· You cannot stop throwing up. °· You have watery poop that does not stop. °· You cannot keep fluids down. °· You or your child has a temperature by mouth above 102° F (38.9° C), not controlled by medicine. °· Your baby is older than 3 months with a rectal temperature of 102° F (38.9° C) or higher. °· Your baby is 3 months old or younger with a rectal temperature of 100.4° F (38° C) or higher. °MAKE SURE YOU:  °· Understand these instructions. °· Will watch this condition. °· Will get help right away if you are not doing well or get worse. °Document Released: 08/19/2008 Document Revised: 11/29/2011 Document Reviewed: 01/12/2011 °ExitCare® Patient Information ©2014 [employer]. ° °

## 2013-11-23 ENCOUNTER — Telehealth: Payer: Self-pay | Admitting: Family Medicine

## 2013-11-23 NOTE — Telephone Encounter (Signed)
Relevant patient education mailed to patient.  

## 2014-03-08 ENCOUNTER — Telehealth: Payer: Self-pay | Admitting: Medical Oncology

## 2014-03-08 NOTE — Telephone Encounter (Signed)
erroneous

## 2014-04-02 ENCOUNTER — Encounter: Payer: Self-pay | Admitting: Pulmonary Disease

## 2014-04-02 ENCOUNTER — Ambulatory Visit (INDEPENDENT_AMBULATORY_CARE_PROVIDER_SITE_OTHER): Payer: BC Managed Care – PPO | Admitting: Pulmonary Disease

## 2014-04-02 VITALS — BP 110/72 | HR 67 | Temp 98.1°F | Ht 75.0 in | Wt 284.8 lb

## 2014-04-02 DIAGNOSIS — G4733 Obstructive sleep apnea (adult) (pediatric): Secondary | ICD-10-CM

## 2014-04-02 NOTE — Progress Notes (Signed)
   Subjective:    Patient ID: [name], male    DOB: 01/22/1971, 43 y.o.   MRN: [id number]  HPI The patient comes in today for followup of his obstructive sleep apnea. He is wearing CPAP compliantly by his download, and has excellent control of his AHI. He continues to have mask leak issues, but he states this is associated with movement during the night from his back pain. He has kept up with his mask cushions and supplies. Of note, his weight is down 3 pounds from the last visit.   Review of Systems  Constitutional: Negative for fever and unexpected weight change.  HENT: Negative for congestion, dental problem, ear pain, nosebleeds, postnasal drip, rhinorrhea, sinus pressure, sneezing, sore throat and trouble swallowing.   Eyes: Negative for redness and itching.  Respiratory: Negative for cough, chest tightness, shortness of breath and wheezing.   Cardiovascular: Negative for palpitations and leg swelling.  Gastrointestinal: Negative for nausea and vomiting.  Genitourinary: Negative for dysuria.  Musculoskeletal: Negative for joint swelling.  Skin: Negative for rash.  Neurological: Negative for headaches.  Hematological: Does not bruise/bleed easily.  Psychiatric/Behavioral: Negative for dysphoric mood. The patient is not nervous/anxious.        Objective:   Physical Exam Overweight male in no acute distress Nose without purulence or discharge noted No skin breakdown or pressure necrosis from the CPAP mask Neck without lymphadenopathy or thyromegaly Lower extremities without edema, no cyanosis Alert and oriented, moves all 4 extremities.       Assessment & Plan:

## 2014-04-02 NOTE — Patient Instructions (Signed)
Continue with cpap, and keep up with mask changes and supplies.  Will send an order to your homecare company Work on weight loss followup with me again in one year.

## 2014-04-02 NOTE — Assessment & Plan Note (Signed)
The patient is doing very well with CPAP, with good control of his AHI and daytime symptoms. I have asked him to keep up with his mask changes and supplies, and to work aggressively on weight loss.

## 2014-05-15 ENCOUNTER — Encounter: Payer: BC Managed Care – PPO | Admitting: Internal Medicine

## 2014-05-17 DIAGNOSIS — F1190 Opioid use, unspecified, uncomplicated: Secondary | ICD-10-CM | POA: Insufficient documentation

## 2014-05-29 ENCOUNTER — Ambulatory Visit (INDEPENDENT_AMBULATORY_CARE_PROVIDER_SITE_OTHER): Payer: BC Managed Care – PPO | Admitting: Internal Medicine

## 2014-05-29 ENCOUNTER — Encounter: Payer: Self-pay | Admitting: Internal Medicine

## 2014-05-29 VITALS — BP 122/80 | HR 69 | Ht 75.0 in | Wt 282.0 lb

## 2014-05-29 DIAGNOSIS — I4891 Unspecified atrial fibrillation: Secondary | ICD-10-CM

## 2014-05-29 DIAGNOSIS — I480 Paroxysmal atrial fibrillation: Secondary | ICD-10-CM

## 2014-05-29 MED ORDER — FLECAINIDE ACETATE 100 MG PO TABS: ORAL_TABLET | ORAL | Status: DC

## 2014-05-29 NOTE — Patient Instructions (Addendum)
Your physician has recommended you make the following change in your medication:  1) TAKE Flecainide 100 mg -- take 3 tablets at a time as needed for Atrial Fibrillation   No follow up needed at this time with Dr. [name]. Follow up as needed.

## 2014-05-29 NOTE — Progress Notes (Signed)
Patient Care Team: Eustaquio Boyden, MD as PCP - General (Family Medicine)   HPI  [name] is a 43 y.o. male Seen for surgical clearance.  He has a history of paroxysmal atrial fibrillation.  He was last seen in 2011  And he is anticipating lower back surgery. Distal been his third surgery since I saw him last.  He has no difficulties in his activities. He is able to climb stairs and carrying things. He denies chest discomfort shortness of breath or peripheral edema.  In the last year he has had 2 interval episodes of atrial fibrillation which he treats with "pill in the pocket" flecainide.    He is compliant with his sleep apnea.  Past Medical History  Diagnosis Date  . HLD (hyperlipidemia)   . Arnold-Chiari malformation 2002    s/p decompression  . Internal hemorrhoids   . Colon polyps 2011    rpt Q5 yrs  . Seasonal allergies   . OSA on CPAP DR CLANCE  . DDD (degenerative disc disease) SPINE  . Paroxysmal a-fib CARDIOLOGIST-  DR [name]-- LAST VISIT 10-22-2010 IN [facility]    controlled with flecainide  prn  . 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 Kidney stone 02/2012    calcium oxalate, removed by urology    Past Surgical History  Procedure Laterality Date  . Adenoidectomy  AGE 37  . Cardioversion  1996, 2008    x2 in 1996  . Hiatal hernia repair  1999  . Colonoscopy  2011    polyp, rec rpt 5 yrs  . Ct chest noncontrast  06/2011    f/u L pulm nodule - gone.  Marland Kitchen Anterior lumbar diskectomy / decompression   10-08-2010    L5 - S1, LEFT SIDE (Brooks)  . Suboccipital craniectomy cervical laminectomy  11-30-2000  DR NUDALMAN    C1 AND PARTIAL C2--   CHIARI  MALFORMATION  . Hip arthroscopy  09-23-2011    LEFT HIP  . Ureteroscopy  03/02/2012    Procedure: URETEROSCOPY;  Surgeon: Marcine Matar, MD;  Location: [facility];  Service: Urology;  Laterality: Left;  stone obtained    Current Outpatient Prescriptions    Medication Sig Dispense Refill  . celecoxib (CELEBREX) 200 MG capsule Take 200 mg by mouth daily.        Marland Kitchen gabapentin (NEURONTIN) 600 MG tablet Take 600 mg by mouth 3 (three) times daily.       Marland Kitchen oxyCODONE-acetaminophen (PERCOCET) 10-325 MG per tablet Take 1 tablet by mouth as needed.       No current facility-administered medications for this visit.    Allergies  Allergen Reactions  . Ibuprofen Swelling    Review of Systems negative except from HPI and PMH  Physical Exam BP 122/80  Pulse 69  Ht  (1.905 m)  Wt 282 lb (127.914 kg)  BMI 35.25 kg/m2 Well developed and well nourished in no acute distress HENT normal E scleral and icterus clear Neck Supple JVP flat; carotids brisk and full Clear to ausculation  Regular rate and rhythm, no murmurs gallops or rub Soft with active bowel sounds No clubbing cyanosis  Edema Alert and oriented, grossly normal motor and sensory function Skin Warm and Dry  ECG demonstrates sinus rhythm at 69 intervals 14/10/37 axis 50 Otherwise normal     Assessment and  Plan paroxysmal atrial fibrillation   Preoperative surgical clearance   Sleep apnea-treated   CHADS-VASc  score 0   Overall [name] is doing quite well. I hope he does well with his surgery that is upcoming. His chronic back pain and has been quite debilitating.   He should be  acceptable risk for his surgery.

## 2014-05-31 DIAGNOSIS — M5417 Radiculopathy, lumbosacral region: Secondary | ICD-10-CM | POA: Insufficient documentation

## 2014-05-31 DIAGNOSIS — M48061 Spinal stenosis, lumbar region without neurogenic claudication: Secondary | ICD-10-CM | POA: Insufficient documentation

## 2014-06-10 ENCOUNTER — Encounter: Payer: Self-pay | Admitting: Family Medicine

## 2014-06-10 ENCOUNTER — Ambulatory Visit (INDEPENDENT_AMBULATORY_CARE_PROVIDER_SITE_OTHER): Payer: BC Managed Care – PPO | Admitting: Family Medicine

## 2014-06-10 VITALS — BP 124/80 | HR 55 | Temp 98.5°F | Wt 283.2 lb

## 2014-06-10 DIAGNOSIS — J069 Acute upper respiratory infection, unspecified: Secondary | ICD-10-CM | POA: Insufficient documentation

## 2014-06-10 NOTE — Patient Instructions (Signed)
Sounds like you have a viral upper respiratory infection/pharyngitis. Viral infections usually take 7-10 days to resolve.  Push fluids and plenty of rest. Start taking vitamin C chewable tablets a few a day. Tylenol for discomfort. See how you feel over next 2 days. Let anesthesiologist know what's going on. Call clinic with questions.  Good to see you today.

## 2014-06-10 NOTE — Progress Notes (Signed)
   BP 124/80  Pulse 55  Temp(Src) 98.5 F (36.9 C) (Oral)  Wt 283 lb 4 oz (128.481 kg)  SpO2 98%   CC: cough  Subjective:    Patient ID: [name], male    DOB: Jul 07, 1971, 43 y.o.   MRN: [id number]  HPI: [name] is a 43 y.o. male presenting on 06/10/2014 for congestion in chest and Cough   3d h/o ST that improved but now has progressed to nasal and sinus congestion. Congestion > rhinorrhea. Clear mucous.  No cough, fever/chills, headache, ear or tooth pain, rashes. No sick contacts at home. Quitting smoking - down to 2/day. No h/o asthma  Upcoming back surgery at [facility] in 2 days.  Relevant past medical, surgical, family and social history reviewed and updated as indicated.  Allergies and medications reviewed and updated. Current Outpatient Prescriptions on File Prior to Visit  Medication Sig  . celecoxib (CELEBREX) 200 MG capsule Take 200 mg by mouth daily.    . flecainide (TAMBOCOR) 100 MG tablet Take 3 tablets at a time, as needed for Atrial Fibrillation.  . gabapentin (NEURONTIN) 600 MG tablet Take 600 mg by mouth 3 (three) times daily.   Marland Kitchen oxyCODONE-acetaminophen (PERCOCET) 10-325 MG per tablet Take 1 tablet by mouth as needed.   No current facility-administered medications on file prior to visit.    Review of Systems Per HPI unless specifically indicated above    Objective:    BP 124/80  Pulse 55  Temp(Src) 98.5 F (36.9 C) (Oral)  Wt 283 lb 4 oz (128.481 kg)  SpO2 98%  Physical Exam  Nursing note and vitals reviewed. Constitutional: He appears well-developed and well-nourished. No distress.  HENT:  Head: Normocephalic and atraumatic.  Right Ear: Hearing, tympanic membrane, external ear and ear canal normal.  Left Ear: Hearing, tympanic membrane, external ear and ear canal normal.  Nose: Mucosal edema present. No rhinorrhea. Right sinus exhibits no maxillary sinus tenderness and no frontal sinus tenderness. Left sinus exhibits no maxillary sinus  tenderness and no frontal sinus tenderness.  Mouth/Throat: Uvula is midline and mucous membranes are normal. Posterior oropharyngeal edema and posterior oropharyngeal erythema present. No oropharyngeal exudate or tonsillar abscesses.  Eyes: Conjunctivae and EOM are normal. Pupils are equal, round, and reactive to light. No scleral icterus.  Neck: Normal range of motion. Neck supple.  Cardiovascular: Normal rate, regular rhythm, normal heart sounds and intact distal pulses.   No murmur heard. Pulmonary/Chest: Effort normal and breath sounds normal. No respiratory distress. He has no wheezes. He has no rales.  Lymphadenopathy:    He has no cervical adenopathy.  Skin: Skin is warm and dry. No rash noted.       Assessment & Plan:   Problem List Items Addressed This Visit   Viral URI - Primary     Discussed anticipated diagnosis as well as course of resolution Complicating factor is upcoming back surgery. Discussed abx will likely not speed recovery. Supportive care discussed - rec buy vit C chewable tablets and push fluids and rest. Pt feeling better compared to yesterday - hopefully will continue to improve quickly and will not need to defer surgery (main concern with upcoming surgery is if develops cough with viral infection). Considered zpack - but doubt it would speed recovery for this viral infection.        Follow up plan: Return if symptoms worsen or fail to improve.

## 2014-06-10 NOTE — Assessment & Plan Note (Signed)
Discussed anticipated diagnosis as well as course of resolution Complicating factor is upcoming back surgery. Discussed abx will likely not speed recovery. Supportive care discussed - rec buy vit C chewable tablets and push fluids and rest. Pt feeling better compared to yesterday - hopefully will continue to improve quickly and will not need to defer surgery (main concern with upcoming surgery is if develops cough with viral infection). Considered zpack - but doubt it would speed recovery for this viral infection.

## 2014-06-10 NOTE — Progress Notes (Signed)
Pre visit review using our clinic review tool, if applicable. No additional management support is needed unless otherwise documented below in the visit note. 

## 2014-07-21 HISTORY — PX: ANTERIOR LUMBAR FUSION: SHX1170

## 2014-08-21 ENCOUNTER — Encounter: Payer: Self-pay | Admitting: Family Medicine

## 2014-09-26 DIAGNOSIS — Z981 Arthrodesis status: Secondary | ICD-10-CM | POA: Insufficient documentation

## 2015-03-06 ENCOUNTER — Encounter: Payer: Self-pay | Admitting: Internal Medicine

## 2015-04-03 ENCOUNTER — Ambulatory Visit: Payer: BC Managed Care – PPO | Admitting: Pulmonary Disease

## 2015-04-15 ENCOUNTER — Encounter: Payer: Self-pay | Admitting: Pulmonary Disease

## 2015-04-15 ENCOUNTER — Ambulatory Visit (INDEPENDENT_AMBULATORY_CARE_PROVIDER_SITE_OTHER): Payer: BLUE CROSS/BLUE SHIELD | Admitting: Pulmonary Disease

## 2015-04-15 VITALS — BP 122/76 | HR 92 | Ht 75.0 in | Wt 281.0 lb

## 2015-04-15 DIAGNOSIS — G4733 Obstructive sleep apnea (adult) (pediatric): Secondary | ICD-10-CM | POA: Diagnosis not present

## 2015-04-15 DIAGNOSIS — F17200 Nicotine dependence, unspecified, uncomplicated: Secondary | ICD-10-CM

## 2015-04-15 DIAGNOSIS — Z720 Tobacco use: Secondary | ICD-10-CM | POA: Diagnosis not present

## 2015-04-15 DIAGNOSIS — Z9989 Dependence on other enabling machines and devices: Principal | ICD-10-CM

## 2015-04-15 NOTE — Assessment & Plan Note (Signed)
Call sleep lab [phone] for mask desensitization visit CPAP supplies will be renewed x 1 year He has a significant mask leak, which is likely due to worn-out seal and should be corrected by getting a newer mask.  Benefits for a fibn discussed Weight loss encouraged, compliance with goal of at least 4-6 hrs every night is the expectation. Advised against medications with sedative side effects Cautioned against driving when sleepy - understanding that sleepiness will vary on a day to day basis

## 2015-04-15 NOTE — Assessment & Plan Note (Signed)
Advised cessation Consider spirometry in the future to motivate him to quit

## 2015-04-15 NOTE — Progress Notes (Signed)
   Subjective:    Patient ID: [name], male    DOB: Jan 10, 1971, 44 y.o.   MRN: [id number]  HPI  Chief Complaint  Patient presents with  . Sleep Apnea    Former [facility] pt. patient has been wearing CPAP every night.  Needs new mask, current mask leaking.      44 year old with paroxysmal atrial fibrillation presents for follow-up of OSA He has not renewed supplies in a long time . he has bruxism's of atrial fibrillation once or twice a year , which resolves with as needed flecainide. He had Arnold-Chiari surgery and spina bifida and required back surgery , and his sleep is interrupted by persistent back pain . He denies any issues with mask or pressure - he does admit to a mask leak which is due to worn-out equipment .  71m Download 03/2015 >> no residuals, large leak, avg 4.5 h  NPSG 2007:  AHI 76/hr, and cpap optimized to 15cm   Past Medical History  Diagnosis Date  . HLD (hyperlipidemia)   . Arnold-Chiari malformation 2002    s/p decompression  . Internal hemorrhoids   . Colon polyps 2011    rpt Q5 yrs  . Seasonal allergies   . OSA on CPAP DR CLANCE  . DDD (degenerative disc disease) SPINE  . Paroxysmal a-fib CARDIOLOGIST-  DR [name]-- LAST VISIT 10-22-2010 IN [facility]    controlled with flecainide  prn  . 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 Kidney stone 02/2012    calcium oxalate, removed by urology    Review of Systems neg for any significant sore throat, dysphagia, itching, sneezing, nasal congestion or excess/ purulent secretions, fever, chills, sweats, unintended wt loss, pleuritic or exertional cp, hempoptysis, orthopnea pnd or change in chronic leg swelling. Also denies presyncope, palpitations, heartburn, abdominal pain, nausea, vomiting, diarrhea or change in bowel or urinary habits, dysuria,hematuria, rash, arthralgias, visual complaints, headache, numbness weakness or ataxia.     Objective:   Physical Exam  Gen. Pleasant, obese, in no  distress ENT - no lesions, no post nasal drip Neck: No JVD, no thyromegaly, no carotid bruits Lungs: no use of accessory muscles, no dullness to percussion, decreased without rales or rhonchi  Cardiovascular: Rhythm regular, heart sounds  normal, no murmurs or gallops, no peripheral edema Musculoskeletal: No deformities, no cyanosis or clubbing , no tremors       Assessment & Plan:

## 2015-04-15 NOTE — Patient Instructions (Signed)
Call sleep lab [phone] for mask desensitization visit CPAP supplies will be renewed x 1 year

## 2015-04-30 ENCOUNTER — Encounter: Payer: Self-pay | Admitting: Pulmonary Disease

## 2016-01-15 ENCOUNTER — Ambulatory Visit
Admission: RE | Admit: 2016-01-15 | Discharge: 2016-01-15 | Disposition: A | Discharge status: Home / Self Care | Payer: BLUE CROSS/BLUE SHIELD | Source: Ambulatory Visit | Attending: Anesthesiology | Admitting: Anesthesiology

## 2016-01-15 ENCOUNTER — Other Ambulatory Visit: Payer: Self-pay | Admitting: Anesthesiology

## 2016-01-15 DIAGNOSIS — M25552 Pain in left hip: Secondary | ICD-10-CM

## 2016-03-09 ENCOUNTER — Ambulatory Visit (INDEPENDENT_AMBULATORY_CARE_PROVIDER_SITE_OTHER): Payer: BLUE CROSS/BLUE SHIELD | Admitting: Internal Medicine

## 2016-03-09 ENCOUNTER — Encounter: Payer: Self-pay | Admitting: Internal Medicine

## 2016-03-09 VITALS — BP 122/82 | HR 85 | Temp 98.7°F | Wt 280.0 lb

## 2016-03-09 DIAGNOSIS — K069 Disorder of gingiva and edentulous alveolar ridge, unspecified: Secondary | ICD-10-CM

## 2016-03-09 DIAGNOSIS — Z833 Family history of diabetes mellitus: Secondary | ICD-10-CM

## 2016-03-09 DIAGNOSIS — R0789 Other chest pain: Secondary | ICD-10-CM

## 2016-03-09 DIAGNOSIS — R5383 Other fatigue: Secondary | ICD-10-CM

## 2016-03-09 DIAGNOSIS — R079 Chest pain, unspecified: Secondary | ICD-10-CM

## 2016-03-09 LAB — CBC
HCT: 41.6 % (ref 39.0–52.0)
HEMOGLOBIN: 13.8 g/dL (ref 13.0–17.0)
MCHC: 33.2 g/dL (ref 30.0–36.0)
MCV: 81.3 fl (ref 78.0–100.0)
PLATELETS: 208 10*3/uL (ref 150.0–400.0)
RBC: 5.11 Mil/uL (ref 4.22–5.81)
RDW: 14.3 % (ref 11.5–15.5)
WBC: 10.4 10*3/uL (ref 4.0–10.5)

## 2016-03-09 LAB — VITAMIN B12: VITAMIN B 12: 450 pg/mL (ref 211–911)

## 2016-03-09 LAB — COMPREHENSIVE METABOLIC PANEL
ALBUMIN: 4 g/dL (ref 3.5–5.2)
ALK PHOS: 66 U/L (ref 39–117)
ALT: 9 U/L (ref 0–53)
AST: 12 U/L (ref 0–37)
BILIRUBIN TOTAL: 0.4 mg/dL (ref 0.2–1.2)
BUN: 20 mg/dL (ref 6–23)
CO2: 29 meq/L (ref 19–32)
CREATININE: 0.99 mg/dL (ref 0.40–1.50)
Calcium: 9.3 mg/dL (ref 8.4–10.5)
Chloride: 104 mEq/L (ref 96–112)
GFR: 85.99 mL/min (ref >60.00)
Glucose, Bld: 86 mg/dL (ref 70–99)
Potassium: 4 mEq/L (ref 3.5–5.1)
Sodium: 138 mEq/L (ref 135–145)
TOTAL PROTEIN: 7.4 g/dL (ref 6.0–8.3)

## 2016-03-09 LAB — HEMOGLOBIN A1C: HEMOGLOBIN A1C: 5.7 % (ref 4.6–6.5)

## 2016-03-09 LAB — FOLATE: Folate: 9.8 ng/mL (ref >5.9)

## 2016-03-09 LAB — VITAMIN D 25 HYDROXY (VIT D DEFICIENCY, FRACTURES): VITD: 36.91 ng/mL (ref 30.00–100.00)

## 2016-03-09 LAB — TSH: TSH: 1.43 u[IU]/mL (ref 0.35–4.50)

## 2016-03-09 NOTE — Progress Notes (Signed)
Subjective:    Patient ID: [name], male    DOB: [birth year], 45 y.o.   MRN: [id number]  HPI  Pt presents to the clinic today to be checked for diabetes. He reports he has been going to the dentist every 3 months to have his teeth cleaned. The dentist noticed that he had gum disease and it was not getting better. The dentist referred him to a periodontist. The periodontist told him that there was nothing wrong with his gums and that he needed to be checked for diabetes. He does have a family history of diabetes. He denies blurred vision, increased thirst, frequent urination, delayed wound healing. He does have a history of lumbar radiculopathy and he is taking Gabapentin daily.  He does c/o fatigue. He noticed this a few months ago. He feels like he is falling apart. He hurts all the time except when he takes his pain meds. He reports he does not want to take the medication but he has to in order to function. He sleeps well if his pain is controlled. He reports poor quality of life. He denies anxiety, depression, SI/HI.  He also c/o soreness at the edge of the sternum. It only hurts when he applies pressure to the area. He denies any trauma to the area. He denies chest pain or shortness of breath. He has not taken anything OTC for this.   Review of Systems      Past Medical History  Diagnosis Date  . HLD (hyperlipidemia)   . Arnold-Chiari malformation ([place]) 2002    s/p decompression  . Internal hemorrhoids   . Colon polyps 2011    rpt Q5 yrs  . Seasonal allergies   . OSA on CPAP DR CLANCE  . DDD (degenerative disc disease) SPINE  . Paroxysmal a-fib ([place]) CARDIOLOGIST-  DR Caryl Comes-- LAST VISIT 10-22-2010 IN [facility]    controlled with flecainide 353m prn  . 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 Kidney stone 02/2012    calcium oxalate, removed by urology    Current Outpatient Prescriptions  Medication Sig Dispense Refill  . celecoxib (CELEBREX) 200 MG capsule Take  200 mg by mouth daily.      . flecainide (TAMBOCOR) 100 MG tablet Take 3 tablets at a time, as needed for Atrial Fibrillation. 6 tablet 0  . gabapentin (NEURONTIN) 600 MG tablet Take 1,200 mg by mouth daily.     .Marland KitchenoxyCODONE (OXY IR/ROXICODONE) 5 MG immediate release tablet Take 5 mg by mouth every 4 (four) hours as needed.   0  . oxyCODONE-acetaminophen (PERCOCET) 10-325 MG tablet Take 1 tablet by mouth every 4 (four) hours as needed.   0   No current facility-administered medications for this visit.    Allergies  Allergen Reactions  . Ibuprofen Swelling    Family History  Problem Relation Age of Onset  . Hypertension Mother   . Heart attack Mother 669 . Heart disease Mother   . Coronary artery disease Mother 663 . Coronary artery disease Father 569   6v CABG  . Heart disease Father 517   CABG  . Diabetes Father   . Alcohol abuse Father   . Liver disease Other     Grandmother (P or M not specified)  . Cancer Paternal Uncle     lung, leukemia  . Hemochromatosis Cousin     Social History   Social History  . Marital Status: Married  Spouse Name: N/A  . Number of Children: N/A  . Years of Education: N/A   Occupational History  . Not on file.   Social History Main Topics  . Smoking status: Current Every Day Smoker -- 0.75 packs/day for 20 years    Types: Cigarettes  . Smokeless tobacco: Never Used  . Alcohol Use: No  . Drug Use: No  . Sexual Activity: Not on file   Other Topics Concern  . Not on file   Social History Narrative   Caffeine: 30oz mountain dew   Lives with wife, 1 daughter, 1 step daughter, 2 dogs   Occupation: Cabin crew for [employer]   Activity: no regular exercise, trouble with back pain   Diet: good, vegetables.     Constitutional: Pt reports fatigue. Denies fever, malaise, headache or abrupt weight changes.  HEENT: Denies eye pain, eye redness, ear pain, ringing in the ears, wax buildup, runny nose, nasal  congestion, bloody nose, or sore throat. Respiratory: Denies difficulty breathing, shortness of breath, cough or sputum production.   Cardiovascular: Denies chest pain, chest tightness, palpitations or swelling in the hands or feet.  Gastrointestinal: Denies abdominal pain, bloating, constipation, diarrhea or blood in the stool.  GU: Denies urgency, frequency, pain with urination, burning sensation, blood in urine, odor or discharge. Musculoskeletal: Pt reports chronic back pain and sternal pain. Denies decrease in range of motion, difficulty with gait, or joint swelling.  Psych: Denies anxiety, depression, SI/HI.  No other specific complaints in a complete review of systems (except as listed in HPI above).  Objective:   Physical Exam   BP 122/82 mmHg  Pulse 85  Temp(Src) 98.7 F (37.1 C) (Oral)  Wt 280 lb (127.007 kg)  SpO2 96% Wt Readings from Last 3 Encounters:  03/09/16 280 lb (127.007 kg)  04/15/15 281 lb (127.461 kg)  06/10/14 283 lb 4 oz (128.481 kg)    General: Appears his stated age, obese in NAD. Skin: Warm, dry and intact. No rashes, lesions or ulcerations noted. Cardiovascular: Normal rate and rhythm. S1,S2 noted.  No murmur, rubs or gallops noted.  Pulmonary/Chest: Normal effort and positive vesicular breath sounds. No respiratory distress. No wheezes, rales or ronchi noted.  Musculoskeletal: Pain with palpation at the end of the sternum at the costovertebral angle. Neurological: Alert and oriented. Cranial nerves II-XII grossly  intact. Coordination normal. No mass noted. Psychiatric: Mood and affect flat.  BMET    Component Value Date/Time   NA 141 02/04/2012 0957   K 4.3 02/04/2012 0957   CL 107 02/04/2012 0957   CO2 26 02/04/2012 0957   GLUCOSE 84 02/04/2012 0957   BUN 23 02/04/2012 0957   CREATININE 1.4 02/04/2012 0957   CALCIUM 8.9 02/04/2012 0957   GFRNONAA >60 10/06/2010 1514   GFRAA  10/06/2010 1514    >60        The eGFR has been  calculated using the MDRD equation. This calculation has not been validated in all clinical situations. eGFR's persistently <60 mL/min signify possible Chronic Kidney Disease.    Lipid Panel  No results found for: CHOL, TRIG, HDL, CHOLHDL, VLDL, LDLCALC  CBC    Component Value Date/Time   WBC 10.3 10/06/2010 1514   RBC 4.85 10/06/2010 1514   HGB 15.2 03/02/2012 1147   HCT 40.6 10/06/2010 1514   PLT 207 10/06/2010 1514   MCV 83.7 10/06/2010 1514   MCH 27.4 10/06/2010 1514   MCHC 32.8 10/06/2010 1514   RDW 13.6  10/06/2010 1514   LYMPHSABS 1.9 06/02/2009 1410   MONOABS 1.4* 06/02/2009 1410   EOSABS 0.1 06/02/2009 1410   BASOSABS 0.2* 06/02/2009 1410    Hgb A1C No results found for: HGBA1C      Assessment & Plan:   Sternal pain:  No loose bone noted Advised him to stop putting pressure on it Can follow up with PCP if persist  Gum disease, family history of DM 2:  A1C today  Fatigue:  I think this is r/t depression from chronic pain He wants me to check labs CBC, CMET, TSH, B12, Folate and Vit D today Will follow up after labs  RTC as needed or if symptoms persist or worsen Allysha Tryon, NP

## 2016-03-09 NOTE — Patient Instructions (Signed)
Fatigue  Fatigue is feeling tired all of the time, a lack of energy, or a lack of motivation. Occasional or mild fatigue is often a normal response to activity or life in general. However, long-lasting (chronic) or extreme fatigue may indicate an underlying medical condition.  HOME CARE INSTRUCTIONS   Watch your fatigue for any changes. The following actions may help to lessen any discomfort you are feeling:  · Talk to your health care provider about how much sleep you need each night. Try to get the required amount every night.  · Take medicines only as directed by your health care provider.  · Eat a healthy and nutritious diet. Ask your health care provider if you need help changing your diet.  · Drink enough fluid to keep your urine clear or pale yellow.  · Practice ways of relaxing, such as yoga, meditation, massage therapy, or acupuncture.  · Exercise regularly.    · Change situations that cause you stress. Try to keep your work and personal routine reasonable.  · Do not abuse illegal drugs.  · Limit alcohol intake to no more than 1 drink per day for nonpregnant women and 2 drinks per day for men. One drink equals 12 ounces of beer, 5 ounces of wine, or 1½ ounces of hard liquor.  · Take a multivitamin, if directed by your health care provider.  SEEK MEDICAL CARE IF:   · Your fatigue does not get better.  · You have a fever.    · You have unintentional weight loss or gain.  · You have headaches.    · You have difficulty:      Falling asleep.    Sleeping throughout the night.  · You feel angry, guilty, anxious, or sad.     · You are unable to have a bowel movement (constipation).    · You skin is dry.     · Your legs or another part of your body is swollen.    SEEK IMMEDIATE MEDICAL CARE IF:   · You feel confused.    · Your vision is blurry.  · You feel faint or pass out.    · You have a severe headache.    · You have severe abdominal, pelvic, or back pain.    · You have chest pain, shortness of breath, or an  irregular or fast heartbeat.    · You are unable to urinate or you urinate less than normal.    · You develop abnormal bleeding, such as bleeding from the rectum, vagina, nose, lungs, or nipples.  · You vomit blood.     · You have thoughts about harming yourself or committing suicide.    · You are worried that you might harm someone else.       This information is not intended to replace advice given to you by your health care provider. Make sure you discuss any questions you have with your health care provider.     Document Released: 07/04/2007 Document Revised: 09/27/2014 Document Reviewed: 01/08/2014  Elsevier Interactive Patient Education ©2016 [employer] Inc[employer]

## 2016-03-09 NOTE — Progress Notes (Signed)
Pre visit review using our clinic review tool, if applicable. No additional management support is needed unless otherwise documented below in the visit note. 

## 2016-03-12 ENCOUNTER — Telehealth: Payer: Self-pay | Admitting: Family Medicine

## 2016-03-12 NOTE — Telephone Encounter (Signed)
Pt calling to check on lab results

## 2016-04-19 ENCOUNTER — Encounter: Payer: Self-pay | Admitting: Adult Health

## 2016-04-19 ENCOUNTER — Ambulatory Visit (INDEPENDENT_AMBULATORY_CARE_PROVIDER_SITE_OTHER): Payer: BLUE CROSS/BLUE SHIELD | Admitting: Adult Health

## 2016-04-19 DIAGNOSIS — G4733 Obstructive sleep apnea (adult) (pediatric): Secondary | ICD-10-CM | POA: Diagnosis not present

## 2016-04-19 DIAGNOSIS — Z9989 Dependence on other enabling machines and devices: Principal | ICD-10-CM

## 2016-04-19 NOTE — Patient Instructions (Signed)
Continue on CPAP At bedtime   Work on weight loss.  Do not drive if sleepy  follow up Dr. Alva  In 1 year and As needed    

## 2016-04-19 NOTE — Addendum Note (Signed)
Addended by: Karalee Height on: 04/19/2016 04:42 PM   Modules accepted: Orders

## 2016-04-19 NOTE — Progress Notes (Signed)
Subjective:    Patient ID: [name], male    DOB: [birth year], 45 y.o.   MRN: [id number]  HPI  45 year old male with obstructive sleep apnea on C Pap History of A -Fib   TEST  He had Arnold-Chiari surgery and spina bifida and required back surgery , and his sleep is interrupted by persistent back pain   04/19/2016 Follow up  :OSA  Patient returns for one-year follow-up for sleep apnea. Patient says he is doing well on C Pap. He wears on average of 6 hours each night.  Download shows excellent compliance with average usage at 4.5 hours. He is on a set pressure of 15 cm of H2O. AHI 1.6. Positive leaks. Patient says he needs a new mask.  Denies chest pain, orthopnea, PND, or increased leg swelling.   Past Medical History:  Diagnosis Date  . Arnold-Chiari malformation ([facility]) 2002   s/p decompression  . 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 Colon polyps 2011   rpt Q5 yrs  . DDD (degenerative disc disease) SPINE  . HLD (hyperlipidemia)   . Internal hemorrhoids   . Kidney stone 02/2012   calcium oxalate, removed by urology  . OSA on CPAP DR CLANCE  . Paroxysmal a-fib ([facility]) CARDIOLOGIST-  DR [name]-- LAST VISIT 10-22-2010 IN [facility]   controlled with flecainide  prn  . Seasonal allergies    Current Outpatient Prescriptions on File Prior to Visit  Medication Sig Dispense Refill  . celecoxib (CELEBREX) 200 MG capsule Take 200 mg by mouth daily.      . flecainide (TAMBOCOR) 100 MG tablet Take 3 tablets at a time, as needed for Atrial Fibrillation. 6 tablet 0  . gabapentin (NEURONTIN) 600 MG tablet Take 600 mg by mouth 2 (two) times daily.     Marland Kitchen oxyCODONE (OXY IR/ROXICODONE) 5 MG immediate release tablet Take 5 mg by mouth every 4 (four) hours as needed.   0  . oxyCODONE-acetaminophen (PERCOCET) 10-325 MG tablet Take 1 tablet by mouth every 4 (four) hours as needed.   0   No current facility-administered medications on file prior to visit.        Review of  Systems    Constitutional:   No  weight loss, night sweats,  Fevers, chills, fatigue, or  lassitude.  HEENT:   No headaches,  Difficulty swallowing,  Tooth/dental problems, or  Sore throat,                No sneezing, itching, ear ache, nasal congestion, post nasal drip,   CV:  No chest pain,  Orthopnea, PND, swelling in lower extremities, anasarca, dizziness, palpitations, syncope.   GI  No heartburn, indigestion, abdominal pain, nausea, vomiting, diarrhea, change in bowel habits, loss of appetite, bloody stools.   Resp: No shortness of breath with exertion or at rest.  No excess mucus, no productive cough,  No non-productive cough,  No coughing up of blood.  No change in color of mucus.  No wheezing.  No chest wall deformity  Skin: no rash or lesions.  GU: no dysuria, change in color of urine, no urgency or frequency.  No flank pain, no hematuria   MS:  No joint pain or swelling.  No decreased range of motion. +chronic  back pain.  Psych:  No change in mood or affect. No depression or anxiety.  No memory loss.      Objective:   Physical Exam  Vitals:   04/19/16  1542  BP: 128/84  Pulse: 88  SpO2: 95%  Weight: 274 lb (124.3 kg)  Height: 6\' 2"  (1.88 m)  Body mass index is 35.18 kg/m.   GEN: A/Ox3; pleasant , NAD, obese    HEENT:  [place]/AT,  EACs-clear, TMs-wnl, NOSE-clear, THROAT-clear, no lesions, no postnasal drip or exudate noted. Class 2-3 MP airway   NECK:  Supple w/ fair ROM; no JVD; normal carotid impulses w/o bruits; no thyromegaly or nodules palpated; no lymphadenopathy.    RESP  Clear  P & A; w/o, wheezes/ rales/ or rhonchi. no accessory muscle use, no dullness to percussion  CARD:  RRR, no m/r/g  , no peripheral edema, pulses intact, no cyanosis or clubbing.  GI:   Soft & nt; nml bowel sounds; no organomegaly or masses detected.   Musco: Warm bil, no deformities or joint swelling noted.   Neuro: alert, no focal deficits noted.    Skin: Warm, no lesions or  rashes  Tammy Parrett NP-C  [facility]  04/19/2016

## 2016-04-19 NOTE — Assessment & Plan Note (Signed)
Severe OSA controlled well on CPAP  Order for new mask.   Plan  Continue on CPAP At bedtime   Work on weight loss.  Do not drive if sleepy follow up Dr. Vassie Loll  In 1 year and As needed

## 2016-05-03 ENCOUNTER — Encounter: Payer: Self-pay | Admitting: Adult Health

## 2016-05-11 ENCOUNTER — Ambulatory Visit (INDEPENDENT_AMBULATORY_CARE_PROVIDER_SITE_OTHER): Payer: BLUE CROSS/BLUE SHIELD | Admitting: Internal Medicine

## 2016-05-11 ENCOUNTER — Encounter: Payer: Self-pay | Admitting: Internal Medicine

## 2016-05-11 VITALS — BP 128/78 | HR 84 | Temp 99.1°F | Wt 280.0 lb

## 2016-05-11 DIAGNOSIS — H60392 Other infective otitis externa, left ear: Secondary | ICD-10-CM

## 2016-05-11 MED ORDER — CIPROFLOXACIN-DEXAMETHASONE 0.3-0.1 % OT SUSP: 4.0000 [drp] | Freq: Two times a day (BID) | OTIC | Status: DC

## 2016-05-11 MED ORDER — CIPROFLOXACIN-DEXAMETHASONE 0.3-0.1 % OT SUSP: 4.0000 [drp] | Freq: Two times a day (BID) | OTIC | 0 refills | Status: DC

## 2016-05-11 NOTE — Addendum Note (Signed)
Addended by: Roena MaladyEVONTENNO, Aslyn Cottman Y on: 05/11/2016 04:28 PM   Modules accepted: Orders

## 2016-05-11 NOTE — Patient Instructions (Signed)

## 2016-05-11 NOTE — Progress Notes (Signed)
Subjective:    Patient ID: [name], male    DOB: [birth year], 45 y.o.   MRN: [id number]  HPI  Pt presents to the clinic today with c/o left ear pain. He reports this started 3 days ago. He describes the pain as aching and sharp. He denies headache, facial pain, nasal congestion or sore throat. He denies fever, chills or body aches. He reports he went swimming twice last week. He has not tried anything OTC for this.  Review of Systems      Past Medical History:  Diagnosis Date  . Arnold-Chiari malformation ([place]) 2002   s/p decompression  . 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 Colon polyps 2011   rpt Q5 yrs  . DDD (degenerative disc disease) SPINE  . HLD (hyperlipidemia)   . Internal hemorrhoids   . Kidney stone 02/2012   calcium oxalate, removed by urology  . OSA on CPAP DR CLANCE  . Paroxysmal a-fib ([place]) CARDIOLOGIST-  DR Caryl Comes-- LAST VISIT 10-22-2010 IN [facility]   controlled with flecainide 331m prn  . Seasonal allergies     Current Outpatient Prescriptions  Medication Sig Dispense Refill  . celecoxib (CELEBREX) 200 MG capsule Take 200 mg by mouth daily.      . flecainide (TAMBOCOR) 100 MG tablet Take 3 tablets at a time, as needed for Atrial Fibrillation. 6 tablet 0  . gabapentin (NEURONTIN) 600 MG tablet Take 600 mg by mouth 2 (two) times daily.     .Marland KitchenoxyCODONE (ROXICODONE) 15 MG immediate release tablet Take 1 tablet by mouth every 6 (six) hours.  0   No current facility-administered medications for this visit.     Allergies  Allergen Reactions  . Ibuprofen Swelling    Family History  Problem Relation Age of Onset  . Hypertension Mother   . Heart attack Mother 637 . Heart disease Mother   . Coronary artery disease Mother 648 . Coronary artery disease Father 572   6v CABG  . Heart disease Father 571   CABG  . Diabetes Father   . Alcohol abuse Father   . Liver disease Other     Grandmother (P or M not specified)  . Cancer Paternal  Uncle     lung, leukemia  . Hemochromatosis Cousin     Social History   Social History  . Marital status: Married    Spouse name: N/A  . Number of children: N/A  . Years of education: N/A   Occupational History  . Not on file.   Social History Main Topics  . Smoking status: Current Every Day Smoker    Packs/day: 0.75    Years: 20.00    Types: Cigarettes  . Smokeless tobacco: Never Used  . Alcohol use No  . Drug use: No  . Sexual activity: Not on file   Other Topics Concern  . 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     Constitutional: Denies fever, malaise, fatigue, headache or abrupt weight changes.  HEENT: Pt reports left ear pain. Denies eye pain, eye redness, ringing in the ears, wax buildup, runny nose, nasal congestion, bloody nose, or sore throat. Respiratory: Denies difficulty breathing, shortness of  breath, cough or sputum production.   Cardiovascular: Denies chest pain, chest tightness, palpitations or swelling in the hands or feet.   No other specific complaints in a complete review of systems (except as listed in HPI above).  Objective:   Physical Exam  BP 128/78   Pulse 84   Temp 99.1 F (37.3 C) (Oral)   Wt 280 lb (127 kg)   SpO2 97%   BMI 35.95 kg/m  Wt Readings from Last 3 Encounters:  05/11/16 280 lb (127 kg)  04/19/16 274 lb (124.3 kg)  03/09/16 280 lb (127 kg)    General: Appears his stated age, well developed, well nourished in NAD. HEENT: Head: normal shape and size;  Left Ears: Canal red and erythematous, unable to visualize TM. Neck:  No adenopathy noted.  BMET    Component Value Date/Time   NA 138 03/09/2016 1444   K 4.0 03/09/2016 1444   CL 104 03/09/2016 1444   CO2 29 03/09/2016 1444    GLUCOSE 86 03/09/2016 1444   BUN 20 03/09/2016 1444   CREATININE 0.99 03/09/2016 1444   CALCIUM 9.3 03/09/2016 1444   GFRNONAA >60 10/06/2010 1514   GFRAA  10/06/2010 1514    >60        The eGFR has been calculated using the MDRD equation. This calculation has not been validated in all clinical situations. eGFR's persistently <60 mL/min signify possible Chronic Kidney Disease.    Lipid Panel  No results found for: CHOL, TRIG, HDL, CHOLHDL, VLDL, LDLCALC  CBC    Component Value Date/Time   WBC 10.4 03/09/2016 1444   RBC 5.11 03/09/2016 1444   HGB 13.8 03/09/2016 1444   HCT 41.6 03/09/2016 1444   PLT 208.0 03/09/2016 1444   MCV 81.3 03/09/2016 1444   MCH 27.4 10/06/2010 1514   MCHC 33.2 03/09/2016 1444   RDW 14.3 03/09/2016 1444   LYMPHSABS 1.9 06/02/2009 1410   MONOABS 1.4 (H) 06/02/2009 1410   EOSABS 0.1 06/02/2009 1410   BASOSABS 0.2 (H) 06/02/2009 1410    Hgb A1C Lab Results  Component Value Date   HGBA1C 5.7 03/09/2016           Assessment & Plan:   Left Otitis Externa:  Laying on a heating pad may help Tylenol as needed for pain eRx for Ciprodex BID x 5 days  RTC as needed or if symptoms persist or worsen BAITY, REGINA, NP

## 2016-07-05 ENCOUNTER — Telehealth: Payer: Self-pay | Admitting: Pulmonary Disease

## 2016-07-05 NOTE — Telephone Encounter (Signed)
Per [name] - she has taken care of this patient, [facility] for her to document.

## 2016-07-05 NOTE — Telephone Encounter (Signed)
Spoke with pt. Pt needed a DL for DOT. Downloaded SD card for 90 days and gave to pt. Pt denied any further questions or concerns needing addressed at this time. Nothing further needed.

## 2016-07-06 ENCOUNTER — Telehealth: Payer: Self-pay | Admitting: Pulmonary Disease

## 2016-07-06 NOTE — Telephone Encounter (Signed)
lmtcb for pt.  

## 2016-07-08 NOTE — Telephone Encounter (Signed)
Called and spoke with pt and [facility] and he will have to have the card read again.  He stated that he picked the new card up 2 days ago.  Nothing further is needed.

## 2017-06-30 ENCOUNTER — Ambulatory Visit (INDEPENDENT_AMBULATORY_CARE_PROVIDER_SITE_OTHER): Payer: 59 | Admitting: Pulmonary Disease

## 2017-06-30 ENCOUNTER — Encounter: Payer: Self-pay | Admitting: Pulmonary Disease

## 2017-06-30 DIAGNOSIS — F17200 Nicotine dependence, unspecified, uncomplicated: Secondary | ICD-10-CM

## 2017-06-30 DIAGNOSIS — G4733 Obstructive sleep apnea (adult) (pediatric): Secondary | ICD-10-CM

## 2017-06-30 DIAGNOSIS — Z9989 Dependence on other enabling machines and devices: Secondary | ICD-10-CM

## 2017-06-30 NOTE — Progress Notes (Signed)
   Subjective:    Patient ID: [name], male    DOB: November 29, 1970, 46 y.o.   MRN: [id number]  HPI  46 year old with paroxysmal atrial fibrillation  for follow-up of OSA. 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  He has been using the same mask for over a year and has a leak. He was able to come off oxycodone, he took a month off from work. He now takes Neurontin in the p.m., 3 g of Tylenol in the morning and Celebrex somewhere in between for his back pain. He denies mask her pressure issues. Weight is unchanged. Download shows good control of events and 15 cm with excellent compliance and large leak  He remains in normal sinus rhythm He continues to smoke about a pack per day has quit at least twice in the past   Significant tests/ events reviewed   NPSG 2007:  AHI 76/hr, and cpap optimized to 15cm  Review of Systems Positive for sneezing and joint pains  Patient denies significant dyspnea,cough, hemoptysis,  chest pain, palpitations, pedal edema, orthopnea, paroxysmal nocturnal dyspnea, lightheadedness, nausea, vomiting, abdominal or  leg pains      Objective:   Physical Exam  Gen. Pleasant, obese, in no distress ENT - no lesions, no post nasal drip Neck: No JVD, no thyromegaly, no carotid bruits Lungs: no use of accessory muscles, no dullness to percussion, decreased without rales or rhonchi  Cardiovascular: Rhythm regular, heart sounds  normal, no murmurs or gallops, no peripheral edema Musculoskeletal: No deformities, no cyanosis or clubbing , no tremors       Assessment & Plan:

## 2017-06-30 NOTE — Assessment & Plan Note (Signed)
Ct CPAP 15 cm  Weight loss encouraged, compliance with goal of at least 4-6 hrs every night is the expectation. Advised against medications with sedative side effects Cautioned against driving when sleepy - understanding that sleepiness will vary on a day to day basis

## 2017-06-30 NOTE — Assessment & Plan Note (Signed)
Smoking cessation was encouraged He will make another quit attempt

## 2017-06-30 NOTE — Addendum Note (Signed)
Addended by: Maxwell Marion A on: 06/30/2017 11:00 AM   Modules accepted: Orders

## 2017-06-30 NOTE — Patient Instructions (Signed)
CPAP supplies will be renewed x 1 year 
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
## 2017-07-06 ENCOUNTER — Encounter: Payer: Self-pay | Admitting: Physician Assistant

## 2017-07-06 ENCOUNTER — Ambulatory Visit (INDEPENDENT_AMBULATORY_CARE_PROVIDER_SITE_OTHER): Payer: 59 | Admitting: Physician Assistant

## 2017-07-06 VITALS — BP 136/78 | HR 85 | Ht 75.0 in | Wt 303.0 lb

## 2017-07-06 DIAGNOSIS — I480 Paroxysmal atrial fibrillation: Secondary | ICD-10-CM

## 2017-07-06 MED ORDER — FLECAINIDE ACETATE 100 MG PO TABS: ORAL_TABLET | ORAL | 0 refills | Status: DC

## 2017-07-06 NOTE — Patient Instructions (Addendum)
Medication Instructions:    Your physician recommends that you continue on your current medications as directed. Please refer to the Current Medication list given to you today.   If you need a refill on your cardiac medications before your next appointment, please call your pharmacy.  Labwork: NONE ORDERED  TODAY    Testing/Procedures: NONE ORDERED  TODAY    Follow-Up:. CONTACT [facility] [phone] AS NEEDED FOR  ANY CARDIAC RELATED SYMPTOMS ALSO AN ANNUAL VISIT     Any Other Special Instructions Will Be Listed Below (If Applicable).

## 2018-03-17 ENCOUNTER — Emergency Department (HOSPITAL_COMMUNITY): Payer: 59

## 2018-03-17 ENCOUNTER — Encounter (HOSPITAL_COMMUNITY): Payer: Self-pay | Admitting: Emergency Medicine

## 2018-03-17 ENCOUNTER — Emergency Department (HOSPITAL_COMMUNITY)
Admission: EM | Admit: 2018-03-17 | Discharge: 2018-03-17 | Disposition: A | Discharge status: Home / Self Care | Payer: 59 | Attending: Emergency Medicine | Admitting: Emergency Medicine

## 2018-03-17 ENCOUNTER — Other Ambulatory Visit: Payer: Self-pay

## 2018-03-17 DIAGNOSIS — N1330 Unspecified hydronephrosis: Secondary | ICD-10-CM | POA: Insufficient documentation

## 2018-03-17 DIAGNOSIS — F17210 Nicotine dependence, cigarettes, uncomplicated: Secondary | ICD-10-CM | POA: Diagnosis not present

## 2018-03-17 DIAGNOSIS — Z79899 Other long term (current) drug therapy: Secondary | ICD-10-CM | POA: Diagnosis not present

## 2018-03-17 DIAGNOSIS — N132 Hydronephrosis with renal and ureteral calculous obstruction: Secondary | ICD-10-CM

## 2018-03-17 DIAGNOSIS — N201 Calculus of ureter: Secondary | ICD-10-CM | POA: Diagnosis not present

## 2018-03-17 DIAGNOSIS — R1031 Right lower quadrant pain: Secondary | ICD-10-CM | POA: Diagnosis present

## 2018-03-17 LAB — CBC WITH DIFFERENTIAL/PLATELET
BASOS PCT: 0 %
Basophils Absolute: 0 10*3/uL (ref 0.0–0.1)
EOS ABS: 0.1 10*3/uL (ref 0.0–0.7)
EOS PCT: 0 %
HCT: 44.6 % (ref 39.0–52.0)
HEMOGLOBIN: 14.7 g/dL (ref 13.0–17.0)
LYMPHS ABS: 1.8 10*3/uL (ref 0.7–4.0)
Lymphocytes Relative: 15 %
MCH: 28.3 pg (ref 26.0–34.0)
MCHC: 33 g/dL (ref 30.0–36.0)
MCV: 85.9 fL (ref 78.0–100.0)
MONOS PCT: 5 %
Monocytes Absolute: 0.6 10*3/uL (ref 0.1–1.0)
NEUTROS PCT: 80 %
Neutro Abs: 9.8 10*3/uL — ABNORMAL HIGH (ref 1.7–7.7)
PLATELETS: 206 10*3/uL (ref 150–400)
RBC: 5.19 MIL/uL (ref 4.22–5.81)
RDW: 14 % (ref 11.5–15.5)
WBC: 12.3 10*3/uL — AB (ref 4.0–10.5)

## 2018-03-17 LAB — COMPREHENSIVE METABOLIC PANEL
ALK PHOS: 58 U/L (ref 38–126)
ALT: 16 U/L (ref 0–44)
AST: 19 U/L (ref 15–41)
Albumin: 3.9 g/dL (ref 3.5–5.0)
Anion gap: 9 (ref 5–15)
BILIRUBIN TOTAL: 0.7 mg/dL (ref 0.3–1.2)
BUN: 20 mg/dL (ref 6–20)
CALCIUM: 9.1 mg/dL (ref 8.9–10.3)
CO2: 25 mmol/L (ref 22–32)
CREATININE: 1.23 mg/dL (ref 0.61–1.24)
Chloride: 106 mmol/L (ref 98–111)
GFR calc Af Amer: >60 mL/min (ref >60)
GFR calc non Af Amer: >60 mL/min (ref >60)
Glucose, Bld: 108 mg/dL — ABNORMAL HIGH (ref 70–99)
Potassium: 4 mmol/L (ref 3.5–5.1)
Sodium: 140 mmol/L (ref 135–145)
TOTAL PROTEIN: 7.5 g/dL (ref 6.5–8.1)

## 2018-03-17 LAB — URINALYSIS, ROUTINE W REFLEX MICROSCOPIC
Bilirubin Urine: NEGATIVE
Glucose, UA: NEGATIVE mg/dL
Ketones, ur: 5 mg/dL — AB
NITRITE: NEGATIVE
PH: 5 (ref 5.0–8.0)
Protein, ur: 100 mg/dL — AB
RBC / HPF: >50 RBC/hpf — ABNORMAL HIGH (ref 0–5)
SPECIFIC GRAVITY, URINE: 1.034 — AB (ref 1.005–1.030)

## 2018-03-17 LAB — LIPASE, BLOOD: Lipase: 25 U/L (ref 11–51)

## 2018-03-17 MED ORDER — ONDANSETRON HCL 4 MG/2ML IJ SOLN
4.0000 mg | Freq: Once | INTRAMUSCULAR | Status: DC
  Filled 2018-03-17: qty 2

## 2018-03-17 MED ORDER — ONDANSETRON 8 MG PO TBDP: 8.0000 mg | ORAL_TABLET | Freq: Three times a day (TID) | ORAL | 0 refills | Status: DC | PRN

## 2018-03-17 MED ORDER — TAMSULOSIN HCL 0.4 MG PO CAPS: 0.4000 mg | ORAL_CAPSULE | Freq: Every day | ORAL | 0 refills | Status: DC

## 2018-03-17 MED ORDER — HYDROCODONE-ACETAMINOPHEN 5-325 MG PO TABS: 1.0000 | ORAL_TABLET | ORAL | 0 refills | Status: DC | PRN

## 2018-03-17 MED ORDER — SODIUM CHLORIDE 0.9 % IV BOLUS
1000.0000 mL | Freq: Once | INTRAVENOUS | Status: AC
  Administered 2018-03-17: 1000 mL via INTRAVENOUS

## 2018-03-17 MED ORDER — SODIUM CHLORIDE 0.9 % IV SOLN: INTRAVENOUS | Status: DC

## 2018-03-17 MED ORDER — HYDROMORPHONE HCL 1 MG/ML IJ SOLN
0.5000 mg | INTRAMUSCULAR | Status: DC | PRN
  Administered 2018-03-17: 0.5 mg via INTRAVENOUS
  Filled 2018-03-17: qty 1

## 2018-03-17 NOTE — ED Provider Notes (Signed)
[facility]-EMERGENCY DEPT Provider Note   CSN: [id number] Arrival date & time: 03/17/18  4098     History   Chief Complaint Chief Complaint  Patient presents with  . Flank Pain    HPI [name] is a 47 y.o. male.  HPI Pt started having pain in his back about a week ago.  Initially it was on the right side but it went away.  Over the last couple of days he has had some urinary discomfort.  He feels like he has to go but doesn't .  No pain or fever.  No blood in the urine.  This am he started having intense pain in his lower back on the right and it goes to the testicle.  He has a history of kidney stones and this feels similar.  Past Medical History:  Diagnosis Date  . Arnold-Chiari malformation (HCC) 2002   s/p decompression  . 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 Colon polyps 2011   rpt Q5 yrs  . DDD (degenerative disc disease) SPINE  . HLD (hyperlipidemia)   . Internal hemorrhoids   . Kidney stone 02/2012   calcium oxalate, removed by urology  . OSA on CPAP DR CLANCE  . Paroxysmal A-fib (HCC) CARDIOLOGIST-  DR [name]-- LAST VISIT 10-22-2010 IN [facility]   controlled with flecainide 300mg  prn  . Seasonal allergies     Patient Active Problem List   Diagnosis Date Noted  . Viral URI 06/10/2014  . Left flank pain 10/26/2013  . Conjunctivitis of both eyes 04/16/2013  . Kidney stones 01/19/2012  . Preoperative clearance 09/10/2011  . Darkening of skin 09/10/2011  . Smoker 07/22/2011  . Left groin pain 04/27/2011  . DEGENERATIVE DISC DISEASE, LUMBOSACRAL SPINE W/RADICULOPATHY 09/22/2010  . GERD 04/09/2010  . OSA on CPAP 03/16/2010  . BACK PAIN, THORACIC REGION 02/24/2010  . LOW BACK PAIN SYNDROME 02/24/2010  . HYPERLIPIDEMIA 01/15/2009  . ATRIAL FIBRILLATION, PAROXYSMAL 01/15/2009  . HEADACHE 07/31/2008    Past Surgical History:  Procedure Laterality Date  . ADENOIDECTOMY  AGE 65  . ANTERIOR LUMBAR DISKECTOMY / DECOMPRESSION    10-08-2010   L5 - S1, LEFT SIDE ([facility])  . ANTERIOR LUMBAR FUSION  07/2014   L4/5, L5/S1 [facility]   . CARDIOVERSION  1996, 2008   x2 in 1996  . COLONOSCOPY  2011   polyp, rec rpt 5 yrs  . CT chest noncontrast  06/2011   f/u L pulm nodule - gone.  Marland Kitchen HIATAL HERNIA REPAIR  1999  . HIP ARTHROSCOPY  09-23-2011   LEFT HIP  . SUBOCCIPITAL CRANIECTOMY CERVICAL LAMINECTOMY  11-30-2000  DR NUDALMAN   C1 AND PARTIAL C2--   CHIARI  MALFORMATION  . URETEROSCOPY  03/02/2012   Procedure: URETEROSCOPY;  Surgeon: Marcine Matar, MD;  Location: [facility];  Service: Urology;  Laterality: Left;  stone obtained        Home Medications    Prior to Admission medications   Medication Sig Start Date End Date Taking? Authorizing Provider  acetaminophen (TYLENOL) 500 MG tablet Take 1,000 mg by mouth every 6 (six) hours as needed for moderate pain.   Yes [provider]  celecoxib (CELEBREX) 200 MG capsule Take 200 mg by mouth daily.     Yes [provider]  flecainide (TAMBOCOR) 100 MG tablet Take 3 tablets at a time, as needed for Atrial Fibrillation. 07/06/17  Yes Sheilah Pigeon, PA-C  gabapentin (NEURONTIN)  600 MG tablet Take 600 mg by mouth 3 (three) times daily.    Yes [provider]  Multiple Vitamin (MULTI VITAMIN PO) Take 1 tablet by mouth daily.   Yes [provider]  HYDROcodone-acetaminophen (NORCO/VICODIN) 5-325 MG tablet Take 1 tablet by mouth every 4 (four) hours as needed. 03/17/18   Linwood Dibbles, MD  ondansetron (ZOFRAN ODT) 8 MG disintegrating tablet Take 1 tablet (8 mg total) by mouth every 8 (eight) hours as needed for nausea or vomiting. 03/17/18   Linwood Dibbles, MD  tamsulosin (FLOMAX) 0.4 MG CAPS capsule Take 1 capsule (0.4 mg total) by mouth daily. 03/17/18   Linwood Dibbles, MD    Family History Family History  Problem Relation Age of Onset  . Hypertension Mother   . Heart attack Mother 59  . Heart disease Mother   . Coronary artery  disease Mother 31  . Coronary artery disease Father 91       6v CABG  . Heart disease Father 42       CABG  . Diabetes Father   . Alcohol abuse Father   . Liver disease Other        Grandmother (P or M not specified)  . Cancer Paternal Uncle        lung, leukemia  . Hemochromatosis Cousin     Social History Social History   Tobacco Use  . Smoking status: Current Every Day Smoker    Packs/day: 1.00    Years: 20.00    Pack years: 20.00    Types: Cigarettes  . Smokeless tobacco: Never Used  Substance Use Topics  . Alcohol use: No  . Drug use: No     Allergies   Ibuprofen   Review of Systems Review of Systems  All other systems reviewed and are negative.    Physical Exam Updated Vital Signs There were no vitals taken for this visit.  Physical Exam  Constitutional: He appears well-developed and well-nourished. No distress.  HENT:  Head: Normocephalic and atraumatic.  Right Ear: External ear normal.  Left Ear: External ear normal.  Eyes: Conjunctivae are normal. Right eye exhibits no discharge. Left eye exhibits no discharge. No scleral icterus.  Neck: Neck supple. No tracheal deviation present.  Cardiovascular: Normal rate, regular rhythm and intact distal pulses.  Pulmonary/Chest: Effort normal and breath sounds normal. No stridor. No respiratory distress. He has no wheezes. He has no rales.  Abdominal: Soft. Bowel sounds are normal. He exhibits no distension. There is no tenderness. There is no rebound and no guarding.  Musculoskeletal: He exhibits no edema or tenderness.  Neurological: He is alert. He has normal strength. No cranial nerve deficit (no facial droop, extraocular movements intact, no slurred speech) or sensory deficit. He exhibits normal muscle tone. He displays no seizure activity. Coordination normal.  Skin: Skin is warm and dry. No rash noted.  Psychiatric: He has a normal mood and affect.  Nursing note and vitals reviewed.    ED Treatments  / Results  Labs (all labs ordered are listed, but only abnormal results are displayed) Labs Reviewed  COMPREHENSIVE METABOLIC PANEL - Abnormal; Notable for the following components:      Result Value   Glucose, Bld 108 (*)    All other components within normal limits  CBC WITH DIFFERENTIAL/PLATELET - Abnormal; Notable for the following components:   WBC 12.3 (*)    Neutro Abs 9.8 (*)    All other components within normal limits  URINALYSIS, ROUTINE W  REFLEX MICROSCOPIC - Abnormal; Notable for the following components:   Color, Urine AMBER (*)    Specific Gravity, Urine 1.034 (*)    Hgb urine dipstick MODERATE (*)    Ketones, ur 5 (*)    Protein, ur 100 (*)    Leukocytes, UA TRACE (*)    RBC / HPF >50 (*)    Bacteria, UA RARE (*)    All other components within normal limits  LIPASE, BLOOD    EKG None  Radiology Dg Abdomen 1 View  Result Date: 03/17/2018 CLINICAL DATA:  Right-sided abdominal pain. EXAM: ABDOMEN - 1 VIEW COMPARISON:  Radiographs of October 26, 2013. FINDINGS: The bowel gas pattern is normal. No radio-opaque calculi or other significant radiographic abnormality are seen. Postsurgical changes are noted in lower lumbar spine. IMPRESSION: No evidence of bowel obstruction or ileus. Electronically Signed   By: Lupita RaiderJames  Green Jr, M.D.   On: 03/17/2018 09:31   Ct Renal Stone Study  Result Date: 03/17/2018 CLINICAL DATA:  Right flank pain for 1 week. EXAM: CT ABDOMEN AND PELVIS WITHOUT CONTRAST TECHNIQUE: Multidetector CT imaging of the abdomen and pelvis was performed following the standard protocol without IV contrast. COMPARISON:  02/04/2012 FINDINGS: Lower chest: Left lower lobe calcified granuloma. Hepatobiliary: Liver is unremarkable.  Small gallstone. Pancreas: Unremarkable Spleen: Unremarkable Adrenals/Urinary Tract: Mild right hydronephrosis. Left kidney is unremarkable. Stranding along the right ureter. 2 mm calculus in the distal right ureter. Linear calcified object  measuring 4 mm at the left bladder base is either a passed calculus or a stone at the level of the left ureterovesical junction. Stomach/Bowel: Normal appendix. No mass in the colon. No evidence of small-bowel obstruction. Stomach and duodenum are unremarkable. Vascular/Lymphatic: No abnormal retroperitoneal adenopathy. Atherosclerotic calcifications of the aorta and iliac arteries. Reproductive: Normal prostate. Other: No free fluid. Musculoskeletal: Hardware is in place for L4 through S1 fusion. No vertebral compression deformity. IMPRESSION: 2 mm distal right ureteral calculus is associated with right hydronephrosis. There is a small calculus at the left bladder base that is either a passed calculus, or just at the left ureterovesical junction. There is no evidence of left hydronephrosis. Cholelithiasis. Electronically Signed   By: Jolaine ClickArthur  Hoss M.D.   On: 03/17/2018 11:00    Procedures Procedures (including critical care time)  Medications Ordered in ED Medications  sodium chloride 0.9 % bolus 1,000 mL (1,000 mLs Intravenous New Bag/Given 03/17/18 0904)    And  0.9 %  sodium chloride infusion (has no administration in time range)  HYDROmorphone (DILAUDID) injection 0.5 mg (0.5 mg Intravenous Given 03/17/18 1127)  ondansetron (ZOFRAN) injection 4 mg (4 mg Intravenous Refused 03/17/18 0904)     Initial Impression / Assessment and Plan / ED Course  I have reviewed the triage vital signs and the nursing notes.  Pertinent labs & imaging results that were available during my care of the patient were reviewed by me and considered in my medical decision making (see chart for details).  Clinical Course as of Mar 17 1154  Fri Mar 17, 2018  1155 Labs notable for hematuria.  CT scan shows right-sided kidney stone that correlates with the patient's pain.   [JK]  1155   Possible left-sided stone as well and the patient did have pain on the left side previously.   [JK]    Clinical Course User  Index [JK] Linwood DibblesKnapp, Heinz Eckert, MD    Patient symptoms are consistent with kidney stones.  Laboratory tests and CT scan confirms diagnosis.  No signs of infection.  No other acute abnormalities.  Patient was treated with pain medications and improved.  Discussed outpatient treatment.  Final Clinical Impressions(s) / ED Diagnoses   Final diagnoses:  Ureteral stone with hydronephrosis    ED Discharge Orders        Ordered    HYDROcodone-acetaminophen (NORCO/VICODIN) 5-325 MG tablet  Every 4 hours PRN     03/17/18 1153    ondansetron (ZOFRAN ODT) 8 MG disintegrating tablet  Every 8 hours PRN     03/17/18 1153    tamsulosin (FLOMAX) 0.4 MG CAPS capsule  Daily     03/17/18 1153       Linwood Dibbles, MD 03/17/18 1155

## 2018-03-17 NOTE — Discharge Instructions (Signed)
Take the medication as prescribed, follow-up with your primary care doctor or urologist

## 2018-03-17 NOTE — ED Triage Notes (Signed)
Pt from home with right flank pain x 1week. Denies hematuria. Says he feels better after urinating.

## 2018-04-12 ENCOUNTER — Encounter: Payer: Self-pay | Admitting: Primary Care

## 2018-04-12 ENCOUNTER — Ambulatory Visit (INDEPENDENT_AMBULATORY_CARE_PROVIDER_SITE_OTHER): Payer: 59 | Admitting: Primary Care

## 2018-04-12 ENCOUNTER — Telehealth: Payer: Self-pay | Admitting: Primary Care

## 2018-04-12 VITALS — BP 146/80 | HR 102 | Ht 75.0 in | Wt 297.2 lb

## 2018-04-12 DIAGNOSIS — Z9989 Dependence on other enabling machines and devices: Secondary | ICD-10-CM

## 2018-04-12 DIAGNOSIS — G4733 Obstructive sleep apnea (adult) (pediatric): Secondary | ICD-10-CM

## 2018-04-12 NOTE — Patient Instructions (Addendum)
Needs spare CPAP machine with supplies with [employer] and new DME company  CPAP 15cm H20 with full face mask and no humidification

## 2018-04-12 NOTE — Assessment & Plan Note (Signed)
-   Sleep study in 2007, AHI 76 - Using CPAP set at 15cm H20, full facemask no humidification - Needs replacement, will try and get temporary machine until we can establish patient with a new DME company

## 2018-04-12 NOTE — Progress Notes (Signed)
 @Patient  ID: Jacob GriffinsSteven R Meyers, male    DOB: 09/21/1970, 47 y.o.   MRN: [id number]  Chief Complaint  Patient presents with  . Follow-up    Cpap is broken.     Referring provider: Eustaquio BoydenGutierrez, Javier, MD  HPI: 47 year old male. Pt of Dr. Vassie LollAlva, last seen 06/30/17. Hx afib, OSA on CPAP.  He had Arnold-Chiari surgery and spina bifida, required surgery and his sleep is interrupted by persistent back pain. He was able to come off oxycodone, he took a month off from work. He now takes Neurontin in the p.m., 3 g of Tylenol in the morning and Celebrex somewhere in between for his back pain.  04/12/2018 Presents for acute visit for OSA. Patient states that his CPAP machine, which is over 49 years old, has not been working properlyHe has been having issues with inconsistent pressure and it shutting off in the middle of the night. He is compliant with use, states that he wears it every night. Goes to bed 10 pm, wakes up 5-6am. Patient would like to change DME companies, states that [facility] has not been responding to him. He would like to pick up a new cpap machine tonight, explained to him that that was unlikely given the time of the day.   Allergies  Allergen Reactions  . Ibuprofen Swelling    Immunization History  Administered Date(s) Administered  . Influenza Split 07/21/2013  . Influenza,inj,Quad PF,6+ Mos 07/10/2015  . Influenza-Unspecified 06/30/2017    Past Medical History:  Diagnosis Date  . Arnold-Chiari malformation (HCC) 2002   s/p decompression  . 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 Colon polyps 2011   rpt Q5 yrs  . DDD (degenerative disc disease) SPINE  . HLD (hyperlipidemia)   . Internal hemorrhoids   . Kidney stone 02/2012   calcium oxalate, removed by urology  . OSA on CPAP DR CLANCE  . Paroxysmal A-fib (HCC) CARDIOLOGIST-  DR Graciela HusbandsKLEIN-- LAST VISIT 10-22-2010 IN [facility]   controlled with flecainide 300mg  prn  . Seasonal allergies     Tobacco History: Social  History   Tobacco Use  Smoking Status Current Every Day Smoker  . Packs/day: 1.00  . Years: 20.00  . Pack years: 20.00  . Types: Cigarettes  Smokeless Tobacco Never Used  Tobacco Comment   pack per day 04/12/2018    Ready to quit: Not Answered Counseling given: Not Answered Comment: pack per day 04/12/2018    Outpatient Medications Prior to Visit  Medication Sig Dispense Refill  . acetaminophen (TYLENOL) 500 MG tablet Take 1,000 mg by mouth every 6 (six) hours as needed for moderate pain.    . celecoxib (CELEBREX) 200 MG capsule Take 200 mg by mouth daily.      . flecainide (TAMBOCOR) 100 MG tablet Take 3 tablets at a time, as needed for Atrial Fibrillation. 6 tablet 0  . gabapentin (NEURONTIN) 600 MG tablet Take 600 mg by mouth 3 (three) times daily.     Marland Kitchen. HYDROcodone-acetaminophen (NORCO/VICODIN) 5-325 MG tablet Take 1 tablet by mouth every 4 (four) hours as needed. 12 tablet 0  . Multiple Vitamin (MULTI VITAMIN PO) Take 1 tablet by mouth daily.    . tamsulosin (FLOMAX) 0.4 MG CAPS capsule Take 1 capsule (0.4 mg total) by mouth daily. 7 capsule 0  . ondansetron (ZOFRAN ODT) 8 MG disintegrating tablet Take 1 tablet (8 mg total) by mouth every 8 (eight) hours as needed for nausea or vomiting. (Patient  not taking: Reported on 04/12/2018) 12 tablet 0   Facility-Administered Medications Prior to Visit  Medication Dose Route Frequency Provider Last Rate Last Dose  . ciprofloxacin-dexamethasone (CIPRODEX) 0.3-0.1 % otic suspension 4 drop  4 drop Left EAR BID Lorre Munroe, NP          Review of Systems  Review of Systems  Constitutional: Negative.   HENT: Negative.   Respiratory: Negative.   Cardiovascular: Negative.      Physical Exam  BP (!) 146/80   Pulse (!) 102   Ht 6\' 3"  (1.905 m)   Wt 297 lb 3.2 oz (134.8 kg)   SpO2 95%   BMI 37.15 kg/m  Physical Exam  Constitutional: He is oriented to person, place, and time. He appears well-developed and well-nourished.    HENT:  Head: Normocephalic and atraumatic.  Eyes: Pupils are equal, round, and reactive to light. EOM are normal.  Neck: Normal range of motion. Neck supple.  Cardiovascular: Normal rate, regular rhythm, normal heart sounds and intact distal pulses.  Pulmonary/Chest: Effort normal and breath sounds normal. No respiratory distress. He has no wheezes.  Abdominal: Soft. Bowel sounds are normal. There is no tenderness.  Neurological: He is alert and oriented to person, place, and time.  Skin: Skin is warm and dry. No rash noted. No erythema.  Psychiatric: He has a normal mood and affect. His behavior is normal. Judgment normal.     Lab Results:  CBC    Component Value Date/Time   WBC 12.3 (H) 03/17/2018 0843   RBC 5.19 03/17/2018 0843   HGB 14.7 03/17/2018 0843   HCT 44.6 03/17/2018 0843   PLT 206 03/17/2018 0843   MCV 85.9 03/17/2018 0843   MCH 28.3 03/17/2018 0843   MCHC 33.0 03/17/2018 0843   RDW 14.0 03/17/2018 0843   LYMPHSABS 1.8 03/17/2018 0843   MONOABS 0.6 03/17/2018 0843   EOSABS 0.1 03/17/2018 0843   BASOSABS 0.0 03/17/2018 0843    BMET    Component Value Date/Time   NA 140 03/17/2018 0843   K 4.0 03/17/2018 0843   CL 106 03/17/2018 0843   CO2 25 03/17/2018 0843   GLUCOSE 108 (H) 03/17/2018 0843   BUN 20 03/17/2018 0843   CREATININE 1.23 03/17/2018 0843   CALCIUM 9.1 03/17/2018 0843   GFRNONAA >60 03/17/2018 0843   GFRAA >60 03/17/2018 0843    BNP No results found for: BNP  ProBNP No results found for: PROBNP  Imaging: Dg Abdomen 1 View  Result Date: 03/17/2018 CLINICAL DATA:  Right-sided abdominal pain. EXAM: ABDOMEN - 1 VIEW COMPARISON:  Radiographs of October 26, 2013. FINDINGS: The bowel gas pattern is normal. No radio-opaque calculi or other significant radiographic abnormality are seen. Postsurgical changes are noted in lower lumbar spine. IMPRESSION: No evidence of bowel obstruction or ileus. Electronically Signed   By: Lupita Raider, M.D.    On: 03/17/2018 09:31   Ct Renal Stone Study  Result Date: 03/17/2018 CLINICAL DATA:  Right flank pain for 1 week. EXAM: CT ABDOMEN AND PELVIS WITHOUT CONTRAST TECHNIQUE: Multidetector CT imaging of the abdomen and pelvis was performed following the standard protocol without IV contrast. COMPARISON:  02/04/2012 FINDINGS: Lower chest: Left lower lobe calcified granuloma. Hepatobiliary: Liver is unremarkable.  Small gallstone. Pancreas: Unremarkable Spleen: Unremarkable Adrenals/Urinary Tract: Mild right hydronephrosis. Left kidney is unremarkable. Stranding along the right ureter. 2 mm calculus in the distal right ureter. Linear calcified object measuring 4 mm at the left bladder base is  either a passed calculus or a stone at the level of the left ureterovesical junction. Stomach/Bowel: Normal appendix. No mass in the colon. No evidence of small-bowel obstruction. Stomach and duodenum are unremarkable. Vascular/Lymphatic: No abnormal retroperitoneal adenopathy. Atherosclerotic calcifications of the aorta and iliac arteries. Reproductive: Normal prostate. Other: No free fluid. Musculoskeletal: Hardware is in place for L4 through S1 fusion. No vertebral compression deformity. IMPRESSION: 2 mm distal right ureteral calculus is associated with right hydronephrosis. There is a small calculus at the left bladder base that is either a passed calculus, or just at the left ureterovesical junction. There is no evidence of left hydronephrosis. Cholelithiasis. Electronically Signed   By: Jolaine Click M.D.   On: 03/17/2018 11:00     Assessment & Plan:   OSA on CPAP - Sleep study in 2007, AHI 76 - Using CPAP set at 15cm H20, full facemask no humidification - Needs replacement, will try and get temporary machine until we can establish patient with a new DME company      Glenford Bayley, NP 04/12/2018

## 2018-04-12 NOTE — Telephone Encounter (Signed)
Can you or lindsay get CPAP download for me. Thanks

## 2018-04-13 NOTE — Telephone Encounter (Signed)
l[facility]1 [facility] requesting [facility].

## 2018-04-13 NOTE — Addendum Note (Signed)
Addended by: Jacquiline DoeROGDON, Sejal Cofield M on: 04/13/2018 11:08 AM   Modules accepted: Orders

## 2018-04-19 NOTE — Telephone Encounter (Signed)
[facility]cb [facility]

## 2018-04-20 NOTE — Telephone Encounter (Signed)
I have spoken to Wilson[facility]eredith [facility].  Sharyl NimrodMeredith stated that cpap [facility], as pt has an older machine. Per Sharyl NimrodMeredith, she will contact pt to schedule a time that download can be obtained.   [facility] to make aware.

## 2018-04-22 ENCOUNTER — Encounter: Payer: Self-pay | Admitting: Primary Care

## 2018-04-25 ENCOUNTER — Ambulatory Visit: Payer: 59 | Admitting: Pulmonary Disease

## 2018-05-03 ENCOUNTER — Telehealth: Payer: Self-pay | Admitting: Primary Care

## 2018-05-03 NOTE — Telephone Encounter (Signed)
Reviewed data from old cpap. SD card showed AHI of 0.8, however, patient has experiencing significant air leaks. Recently got a new machine and states that it is working well. Has no issues. Will need FU in 30 days

## 2018-06-30 ENCOUNTER — Ambulatory Visit (INDEPENDENT_AMBULATORY_CARE_PROVIDER_SITE_OTHER): Payer: 59 | Admitting: Primary Care

## 2018-06-30 ENCOUNTER — Encounter: Payer: Self-pay | Admitting: Primary Care

## 2018-06-30 DIAGNOSIS — G4733 Obstructive sleep apnea (adult) (pediatric): Secondary | ICD-10-CM | POA: Diagnosis not present

## 2018-06-30 DIAGNOSIS — Z9989 Dependence on other enabling machines and devices: Secondary | ICD-10-CM | POA: Diagnosis not present

## 2018-06-30 NOTE — Progress Notes (Signed)
@Patient  ID: [name], male    DOB: 1970/12/10, 47 y.o.   MRN: [id number]  Chief Complaint  Patient presents with  . Follow-up    CPAP follow    Referring provider: Eustaquio Boyden, MD  HPI: 47 year old male, current everyday smoker. Hx afib, OSA on CPAP.  He had Arnold-Chiari surgery and spina bifida, required surgery and his sleep is interrupted by persistent back pain. He was able to come off oxycodone, he took a month off from work. He now takes Neurontin at night, 3 g of Tylenol in the morning and Celebrex somewhere in between for his back pain.  04/12/2018 Presents for acute visit for OSA. Patient states that his CPAP machine, which is over 24 years old, has not been working properly. He has been having issues with inconsistent pressure and it shutting off in the middle of the night. He is compliant with use, states that he wears it every night. Goes to bed 10 pm, wakes up 5-6am. Patient would like to change DME companies, states that advance has not been responding to him. He would like to pick up a new cpap machine tonight, explained to him that that was unlikely given the time of the day.   07/02/2018 Patient presents today for OSA follow- up. Received new CPAP machine d/t malfunction. Airview download reviewed and patient was experiencing significant air leaks.  States that his company told him he had to come in today for review visit.  Patient is doing well, compliant with CPAP and having no issues.  Average use is 6 hours and 38 minutes.  States that he goes to bed around 10 PM and wakes up at 5 AM, uses appliance for duration of his sleep.  He will wake up at night to walk his dog occasionally which could account for the air leaks that are shown on the report.    Airview Download- 30 days: 29/30 days, 28 > 4hrs Average use 6 hours  Pressure 15cm H20  Leaks-  Median 10.3, 95th% 45.2  L/min AHI 3.6     Allergies  Allergen Reactions  . Ibuprofen Swelling     Immunization History  Administered Date(s) Administered  . Influenza Split 07/21/2013  . Influenza,inj,Quad PF,6+ Mos 07/10/2015  . Influenza-Unspecified 06/30/2017    Past Medical History:  Diagnosis Date  . Arnold-Chiari malformation (HCC) 2002   s/p decompression  . 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 Colon polyps 2011   rpt Q5 yrs  . DDD (degenerative disc disease) SPINE  . HLD (hyperlipidemia)   . Internal hemorrhoids   . Kidney stone 02/2012   calcium oxalate, removed by urology  . OSA on CPAP DR CLANCE  . Paroxysmal A-fib (HCC) CARDIOLOGIST-  DR [name]-- LAST VISIT 10-22-2010 IN [facility]   controlled with flecainide 300mg  prn  . Seasonal allergies     Tobacco History: Social History   Tobacco Use  Smoking Status Current Every Day Smoker  . Packs/day: 1.00  . Years: 20.00  . Pack years: 20.00  . Types: Cigarettes  Smokeless Tobacco Never Used  Tobacco Comment   pack per day 04/12/2018    Ready to quit: Not Answered Counseling given: Not Answered Comment: pack per day 04/12/2018    Outpatient Medications Prior to Visit  Medication Sig Dispense Refill  . acetaminophen (TYLENOL) 500 MG tablet Take 1,000 mg by mouth every 6 (six) hours as needed for moderate pain.    Marland Kitchen  celecoxib (CELEBREX) 200 MG capsule Take 200 mg by mouth daily.      Marland Kitchen gabapentin (NEURONTIN) 600 MG tablet Take 600 mg by mouth 3 (three) times daily.     . Multiple Vitamin (MULTI VITAMIN PO) Take 1 tablet by mouth daily.    . flecainide (TAMBOCOR) 100 MG tablet Take 3 tablets at a time, as needed for Atrial Fibrillation. (Patient not taking: Reported on 06/30/2018) 6 tablet 0  . tamsulosin (FLOMAX) 0.4 MG CAPS capsule Take 1 capsule (0.4 mg total) by mouth daily. (Patient not taking: Reported on 06/30/2018) 7 capsule 0  . HYDROcodone-acetaminophen (NORCO/VICODIN) 5-325 MG tablet Take 1 tablet by mouth every 4 (four) hours as needed. 12 tablet 0   Facility-Administered  Medications Prior to Visit  Medication Dose Route Frequency Provider Last Rate Last Dose  . ciprofloxacin-dexamethasone (CIPRODEX) 0.3-0.1 % otic suspension 4 drop  4 drop Left EAR BID Lorre Munroe, NP        Review of Systems  Review of Systems  Constitutional: Negative.   HENT: Negative.   Respiratory: Negative.   Cardiovascular: Negative.   Psychiatric/Behavioral: Negative for sleep disturbance.     Physical Exam  BP (!) 144/80 (BP Location: Left Arm, Cuff Size: Normal)   Pulse 85   Ht 6\' 2"  (1.88 m)   Wt 296 lb 3.2 oz (134.4 kg)   SpO2 97%   BMI 38.03 kg/m  Physical Exam  Constitutional: He is oriented to person, place, and time. He appears well-developed and well-nourished.  HENT:  Head: Normocephalic and atraumatic.  Eyes: Pupils are equal, round, and reactive to light. EOM are normal.  Neck: Normal range of motion. Neck supple.  Cardiovascular: Normal rate, regular rhythm, normal heart sounds and intact distal pulses.  Pulmonary/Chest: Effort normal and breath sounds normal. No respiratory distress. He has no wheezes.  Abdominal: Soft. Bowel sounds are normal. There is no tenderness.  Neurological: He is alert and oriented to person, place, and time.  Skin: Skin is warm and dry. No rash noted. No erythema.  Psychiatric: He has a normal mood and affect. His behavior is normal. Judgment normal.     Lab Results:  CBC    Component Value Date/Time   WBC 12.3 (H) 03/17/2018 0843   RBC 5.19 03/17/2018 0843   HGB 14.7 03/17/2018 0843   HCT 44.6 03/17/2018 0843   PLT 206 03/17/2018 0843   MCV 85.9 03/17/2018 0843   MCH 28.3 03/17/2018 0843   MCHC 33.0 03/17/2018 0843   RDW 14.0 03/17/2018 0843   LYMPHSABS 1.8 03/17/2018 0843   MONOABS 0.6 03/17/2018 0843   EOSABS 0.1 03/17/2018 0843   BASOSABS 0.0 03/17/2018 0843    BMET    Component Value Date/Time   NA 140 03/17/2018 0843   K 4.0 03/17/2018 0843   CL 106 03/17/2018 0843   CO2 25 03/17/2018 0843    GLUCOSE 108 (H) 03/17/2018 0843   BUN 20 03/17/2018 0843   CREATININE 1.23 03/17/2018 0843   CALCIUM 9.1 03/17/2018 0843   GFRNONAA >60 03/17/2018 0843   GFRAA >60 03/17/2018 0843    BNP No results found for: BNP  ProBNP No results found for: PROBNP  Imaging: No results found.   Assessment & Plan:   OSA on CPAP - Stable - Compliant with CPAP with reported benefit  - Do not drive if experiencing excessive daytime fatigue or somnolence - FU in 1 year and as needed     Glenford Bayley, NP  07/02/2018  

## 2018-07-02 NOTE — Patient Instructions (Signed)
Annual follow-up  Make sure to wear CPAP every night for 4-6 hours Do not drive if experiencing excessive daytime fatigue

## 2018-07-02 NOTE — Assessment & Plan Note (Signed)
-   Stable - Compliant with CPAP with reported benefit  - Do not drive if experiencing excessive daytime fatigue or somnolence - FU in 1 year and as needed

## 2018-07-26 ENCOUNTER — Ambulatory Visit: Payer: Self-pay | Admitting: Orthopedic Surgery

## 2018-07-26 ENCOUNTER — Encounter: Payer: Self-pay | Admitting: Family Medicine

## 2018-07-26 DIAGNOSIS — M1712 Unilateral primary osteoarthritis, left knee: Secondary | ICD-10-CM | POA: Insufficient documentation

## 2018-08-15 ENCOUNTER — Other Ambulatory Visit: Payer: Self-pay

## 2018-08-15 ENCOUNTER — Encounter (HOSPITAL_BASED_OUTPATIENT_CLINIC_OR_DEPARTMENT_OTHER): Payer: Self-pay | Admitting: *Deleted

## 2018-08-15 NOTE — Progress Notes (Addendum)
SPOKE WITH Hasnain NPO AFTER MIDNIGHT FOOD, CLEAR LIQUIDS UMTIL 800 AM THEN NPO, ARRIVE 1200 PM 08-24-18 [facility] MEDS TO TAKE: GABAPENTIN, TYLENOL PRN. BRING CPAP MASK TUIBNG AND MACHINE RECORDS ON CHART/EPIC: LOV PULMONARY ELIZABETH WALSH NO 06-30-18, LOV CARDIOLOGY RENEE URSAY PA 07-06-17 WIFE TAMMY DRIVER WILL STAY FOR SURGERY HAS SURGERY ORDERS IN EP[facility] NEEDS EKG AND I STAT 4  

## 2018-08-15 NOTE — H&P (View-Only) (Signed)
SPOKE WITH Sue NPO AFTER MIDNIGHT FOOD, CLEAR LIQUIDS UMTIL 800 AM THEN NPO, ARRIVE 1200 PM 08-24-18 [facility] MEDS TO TAKE: GABAPENTIN, TYLENOL PRN. BRING CPAP MASK TUIBNG AND MACHINE RECORDS ON CHART/EPIC: LOV PULMONARY ELIZABETH WALSH NO 06-30-18, LOV CARDIOLOGY RENEE URSAY PA 07-06-17 WIFE TAMMY DRIVER WILL STAY FOR SURGERY HAS SURGERY ORDERS IN EPIC NEEDS EKG AND I STAT 4

## 2018-08-24 ENCOUNTER — Ambulatory Visit (HOSPITAL_COMMUNITY): Payer: 59

## 2018-08-24 ENCOUNTER — Other Ambulatory Visit: Payer: Self-pay

## 2018-08-24 ENCOUNTER — Ambulatory Visit (HOSPITAL_BASED_OUTPATIENT_CLINIC_OR_DEPARTMENT_OTHER): Payer: 59 | Admitting: Anesthesiology

## 2018-08-24 ENCOUNTER — Ambulatory Visit (HOSPITAL_BASED_OUTPATIENT_CLINIC_OR_DEPARTMENT_OTHER)
Admission: RE | Admit: 2018-08-24 | Discharge: 2018-08-24 | Disposition: A | Discharge status: Home / Self Care | Payer: 59 | Source: Ambulatory Visit | Attending: Specialist | Admitting: Specialist

## 2018-08-24 ENCOUNTER — Encounter (HOSPITAL_BASED_OUTPATIENT_CLINIC_OR_DEPARTMENT_OTHER)
Admission: RE | Disposition: A | Discharge status: Home / Self Care | Payer: Self-pay | Source: Ambulatory Visit | Attending: Specialist

## 2018-08-24 ENCOUNTER — Encounter (HOSPITAL_BASED_OUTPATIENT_CLINIC_OR_DEPARTMENT_OTHER): Payer: Self-pay | Admitting: *Deleted

## 2018-08-24 DIAGNOSIS — S83242A Other tear of medial meniscus, current injury, left knee, initial encounter: Secondary | ICD-10-CM | POA: Insufficient documentation

## 2018-08-24 DIAGNOSIS — S82142A Displaced bicondylar fracture of left tibia, initial encounter for closed fracture: Secondary | ICD-10-CM | POA: Insufficient documentation

## 2018-08-24 DIAGNOSIS — X58XXXA Exposure to other specified factors, initial encounter: Secondary | ICD-10-CM | POA: Insufficient documentation

## 2018-08-24 DIAGNOSIS — M1712 Unilateral primary osteoarthritis, left knee: Secondary | ICD-10-CM | POA: Insufficient documentation

## 2018-08-24 DIAGNOSIS — G4733 Obstructive sleep apnea (adult) (pediatric): Secondary | ICD-10-CM | POA: Insufficient documentation

## 2018-08-24 DIAGNOSIS — Z9889 Other specified postprocedural states: Secondary | ICD-10-CM

## 2018-08-24 DIAGNOSIS — Z79899 Other long term (current) drug therapy: Secondary | ICD-10-CM | POA: Diagnosis not present

## 2018-08-24 DIAGNOSIS — F17210 Nicotine dependence, cigarettes, uncomplicated: Secondary | ICD-10-CM | POA: Diagnosis not present

## 2018-08-24 HISTORY — DX: Personal history of urinary calculi: Z87.442

## 2018-08-24 HISTORY — PX: KNEE ARTHROSCOPY WITH MEDIAL MENISECTOMY: SHX5651

## 2018-08-24 LAB — POCT I-STAT 4, (NA,K, GLUC, HGB,HCT)
Glucose, Bld: 88 mg/dL (ref 70–99)
HCT: 43 % (ref 39.0–52.0)
Hemoglobin: 14.6 g/dL (ref 13.0–17.0)
Potassium: 3.9 mmol/L (ref 3.5–5.1)
Sodium: 141 mmol/L (ref 135–145)

## 2018-08-24 SURGERY — ARTHROSCOPY, KNEE, WITH MEDIAL MENISCECTOMY
Anesthesia: Regional | Site: Knee | Laterality: Left

## 2018-08-24 MED ORDER — MIDAZOLAM HCL 2 MG/2ML IJ SOLN
INTRAMUSCULAR | Status: AC
  Filled 2018-08-24: qty 2

## 2018-08-24 MED ORDER — CHLORHEXIDINE GLUCONATE 4 % EX LIQD
60.0000 mL | Freq: Once | CUTANEOUS | Status: DC
  Filled 2018-08-24: qty 118

## 2018-08-24 MED ORDER — CEFAZOLIN SODIUM-DEXTROSE 2-4 GM/100ML-% IV SOLN
2.0000 g | INTRAVENOUS | Status: DC
  Filled 2018-08-24: qty 100

## 2018-08-24 MED ORDER — MORPHINE SULFATE (PF) 4 MG/ML IV SOLN
INTRAVENOUS | Status: DC | PRN
  Administered 2018-08-24: 4 mg via INTRAVENOUS

## 2018-08-24 MED ORDER — LIDOCAINE 2% (20 MG/ML) 5 ML SYRINGE
INTRAMUSCULAR | Status: AC
  Filled 2018-08-24: qty 5

## 2018-08-24 MED ORDER — BUPIVACAINE HCL 0.25 % IJ SOLN
INTRAMUSCULAR | Status: DC | PRN
  Administered 2018-08-24: 30 mL

## 2018-08-24 MED ORDER — PROPOFOL 10 MG/ML IV BOLUS
INTRAVENOUS | Status: AC
  Filled 2018-08-24: qty 40

## 2018-08-24 MED ORDER — DEXAMETHASONE SODIUM PHOSPHATE 10 MG/ML IJ SOLN
INTRAMUSCULAR | Status: DC | PRN
  Administered 2018-08-24: 5 mg via INTRAVENOUS

## 2018-08-24 MED ORDER — FENTANYL CITRATE (PF) 100 MCG/2ML IJ SOLN
100.0000 ug | Freq: Once | INTRAMUSCULAR | Status: AC
  Administered 2018-08-24: 50 ug via INTRAVENOUS
  Filled 2018-08-24: qty 2

## 2018-08-24 MED ORDER — LIDOCAINE 2% (20 MG/ML) 5 ML SYRINGE
INTRAMUSCULAR | Status: DC | PRN
  Administered 2018-08-24: 100 mg via INTRAVENOUS

## 2018-08-24 MED ORDER — FENTANYL CITRATE (PF) 100 MCG/2ML IJ SOLN
INTRAMUSCULAR | Status: DC | PRN
  Administered 2018-08-24 (×4): 50 ug via INTRAVENOUS

## 2018-08-24 MED ORDER — LACTATED RINGERS IV SOLN
INTRAVENOUS | Status: DC
  Administered 2018-08-24: 12:00:00 via INTRAVENOUS
  Filled 2018-08-24: qty 1000

## 2018-08-24 MED ORDER — FENTANYL CITRATE (PF) 100 MCG/2ML IJ SOLN
INTRAMUSCULAR | Status: AC
  Filled 2018-08-24: qty 2

## 2018-08-24 MED ORDER — METHYLPREDNISOLONE ACETATE 40 MG/ML IJ SUSP
INTRAMUSCULAR | Status: DC | PRN
  Administered 2018-08-24: 40 mg

## 2018-08-24 MED ORDER — BUPIVACAINE-EPINEPHRINE (PF) 0.25% -1:200000 IJ SOLN
INTRAMUSCULAR | Status: DC | PRN
  Administered 2018-08-24: 30 mL

## 2018-08-24 MED ORDER — DEXAMETHASONE SODIUM PHOSPHATE 10 MG/ML IJ SOLN
INTRAMUSCULAR | Status: AC
  Filled 2018-08-24: qty 1

## 2018-08-24 MED ORDER — MIDAZOLAM HCL 2 MG/2ML IJ SOLN
2.0000 mg | Freq: Once | INTRAMUSCULAR | Status: AC
  Administered 2018-08-24: 2 mg via INTRAVENOUS
  Filled 2018-08-24: qty 2

## 2018-08-24 MED ORDER — SODIUM CHLORIDE 0.9 % IR SOLN
Status: DC | PRN
  Administered 2018-08-24: 6000 mL

## 2018-08-24 MED ORDER — OXYCODONE HCL 5 MG PO TABS
5.0000 mg | ORAL_TABLET | Freq: Once | ORAL | Status: AC
  Administered 2018-08-24: 5 mg via ORAL
  Filled 2018-08-24: qty 1

## 2018-08-24 MED ORDER — FENTANYL CITRATE (PF) 100 MCG/2ML IJ SOLN
INTRAMUSCULAR | Status: AC
  Filled 2018-08-24: qty 4

## 2018-08-24 MED ORDER — MIDAZOLAM HCL 2 MG/2ML IJ SOLN
INTRAMUSCULAR | Status: DC | PRN
  Administered 2018-08-24: 2 mg via INTRAVENOUS

## 2018-08-24 MED ORDER — MORPHINE SULFATE (PF) 4 MG/ML IV SOLN
INTRAVENOUS | Status: AC
  Filled 2018-08-24: qty 1

## 2018-08-24 MED ORDER — CEPHALEXIN 500 MG PO CAPS: 500.0000 mg | ORAL_CAPSULE | Freq: Three times a day (TID) | ORAL | 0 refills | Status: DC

## 2018-08-24 MED ORDER — ONDANSETRON HCL 4 MG/2ML IJ SOLN
INTRAMUSCULAR | Status: DC | PRN
  Administered 2018-08-24: 4 mg via INTRAVENOUS

## 2018-08-24 MED ORDER — OXYCODONE HCL 5 MG PO TABS
ORAL_TABLET | ORAL | Status: AC
  Filled 2018-08-24: qty 1

## 2018-08-24 MED ORDER — DEXTROSE 5 % IV SOLN
3.0000 g | INTRAVENOUS | Status: AC
  Administered 2018-08-24: 3 g via INTRAVENOUS
  Filled 2018-08-24: qty 3

## 2018-08-24 MED ORDER — ONDANSETRON HCL 4 MG/2ML IJ SOLN
INTRAMUSCULAR | Status: AC
  Filled 2018-08-24: qty 2

## 2018-08-24 MED ORDER — OXYCODONE HCL 5 MG PO TABS: 5.0000 mg | ORAL_TABLET | ORAL | 0 refills | Status: AC | PRN

## 2018-08-24 MED ORDER — FENTANYL CITRATE (PF) 100 MCG/2ML IJ SOLN
25.0000 ug | INTRAMUSCULAR | Status: DC | PRN
  Administered 2018-08-24 (×2): 50 ug via INTRAVENOUS
  Filled 2018-08-24: qty 1

## 2018-08-24 MED ORDER — PROPOFOL 10 MG/ML IV BOLUS
INTRAVENOUS | Status: DC | PRN
  Administered 2018-08-24: 130 mg via INTRAVENOUS
  Administered 2018-08-24: 70 mg via INTRAVENOUS

## 2018-08-24 MED ORDER — ASPIRIN EC 325 MG PO TBEC: 325.0000 mg | DELAYED_RELEASE_TABLET | Freq: Two times a day (BID) | ORAL | 0 refills | Status: DC

## 2018-08-24 MED FILL — oxyCODONE HCL 5 MG TABS: 5 | 7 days supply | Qty: 40 | Fill #0

## 2018-08-24 MED FILL — CEPHALEXIN 500 MG CAPSULE: 500 | 4 days supply | Qty: 12 | Fill #0

## 2018-08-24 SURGICAL SUPPLY — 40 items
BANDAGE ESMARK 6X9 LF (GAUZE/BANDAGES/DRESSINGS) ×1 IMPLANT
BLADE CUDA GRT WHITE 3.5 (BLADE) ×2 IMPLANT
BNDG CMPR 9X6 STRL LF SNTH (GAUZE/BANDAGES/DRESSINGS) ×1
BNDG ESMARK 6X9 LF (GAUZE/BANDAGES/DRESSINGS) ×2
BNDG GAUZE ELAST 4 BULKY (GAUZE/BANDAGES/DRESSINGS) ×2 IMPLANT
COVER WAND RF STERILE (DRAPES) ×2 IMPLANT
DRAPE ARTHROSCOPY W/POUCH 114 (DRAPES) ×2 IMPLANT
DRAPE C-ARM 42X72 X-RAY (DRAPES) ×2 IMPLANT
DRAPE INCISE IOBAN 66X45 STRL (DRAPES) ×2 IMPLANT
DRAPE SHEET LG 3/4 BI-LAMINATE (DRAPES) ×2 IMPLANT
DRAPE U-SHAPE 47X51 STRL (DRAPES) ×2 IMPLANT
DURAPREP 26ML APPLICATOR (WOUND CARE) ×2 IMPLANT
ELECT MENISCUS 165MM 90D (ELECTRODE) IMPLANT
GAUZE SPONGE 4X4 12PLY STRL (GAUZE/BANDAGES/DRESSINGS) ×2 IMPLANT
GAUZE XEROFORM 1X8 LF (GAUZE/BANDAGES/DRESSINGS) ×2 IMPLANT
GLOVE BIO SURGEON STRL SZ7.5 (GLOVE) ×2 IMPLANT
GLOVE BIO SURGEON STRL SZ8 (GLOVE) ×2 IMPLANT
GLOVE INDICATOR 8.0 STRL GRN (GLOVE) ×4 IMPLANT
GOWN STRL REUS W/TWL LRG LVL3 (GOWN DISPOSABLE) ×2 IMPLANT
GOWN STRL REUS W/TWL XL LVL3 (GOWN DISPOSABLE) ×4 IMPLANT
IV NS IRRIG 3000ML ARTHROMATIC (IV SOLUTION) ×4 IMPLANT
KIT MIXER ACCUMIX (KITS) ×2 IMPLANT
KIT TURNOVER CYSTO (KITS) ×2 IMPLANT
KNEE KIT SCP W/SIDE ACCUPORT (Joint) ×2 IMPLANT
KNEE WRAP E Z 3 GEL PACK (MISCELLANEOUS) ×2 IMPLANT
MANIFOLD NEPTUNE II (INSTRUMENTS) ×2 IMPLANT
NEEDLE HYPO 22GX1.5 SAFETY (NEEDLE) IMPLANT
PACK ARTHROSCOPY DSU (CUSTOM PROCEDURE TRAY) ×2 IMPLANT
PACK BASIN DAY SURGERY FS (CUSTOM PROCEDURE TRAY) ×2 IMPLANT
PAD ABD 8X10 STRL (GAUZE/BANDAGES/DRESSINGS) ×2 IMPLANT
PAD ARMBOARD 7.5X6 YLW CONV (MISCELLANEOUS) IMPLANT
PROBE BIPOLAR 50 DEGREE SUCT (MISCELLANEOUS) ×1 IMPLANT
SET ARTHROSCOPY TUBING (MISCELLANEOUS) ×2
SET ARTHROSCOPY TUBING LN (MISCELLANEOUS) ×1 IMPLANT
SUT ETHILON 4 0 PS 2 18 (SUTURE) ×2 IMPLANT
SYR CONTROL 10ML LL (SYRINGE) ×2 IMPLANT
TOWEL OR 17X24 6PK STRL BLUE (TOWEL DISPOSABLE) ×2 IMPLANT
TUBE CONNECTING 12X1/4 (SUCTIONS) IMPLANT
WATER STERILE IRR 500ML POUR (IV SOLUTION) ×2 IMPLANT
WRAP KNEE MAXI GEL POST OP (GAUZE/BANDAGES/DRESSINGS) ×1 IMPLANT

## 2018-08-24 NOTE — H&P (Signed)
[name] is an 47 y.o. male.   Chief Complaint: left knee pain HPI: Patient presents with joint discomfort that had been persistent for several weeks now. Despite conservative treatments, the patients discomfort has not improved. Imaging was obtained. Other conservative and surgical treatments were discussed in detail. Patient wishes to proceed with surgery as consented. Denies SOB, CP, or calf pain. No Fever, chills, or nausea/ vomiting.   Past Medical History:  Diagnosis Date  . Arnold-Chiari malformation (HCC) 2002   s/p decompression  . 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 Colon polyps 2011   rpt Q5 yrs  . DDD (degenerative disc disease) SPINE  . History of kidney stones   . HLD (hyperlipidemia)   . Internal hemorrhoids   . OSA on CPAP DR CLANCE   CPAP  . Paroxysmal A-fib (HCC) CARDIOLOGIST-  DR [name]-- LAST VISIT 10-22-2010 IN [facility]   controlled with flecainide 300mg  prn  . Seasonal allergies     Past Surgical History:  Procedure Laterality Date  . ADENOIDECTOMY  AGE 68  . ANTERIOR LUMBAR DISKECTOMY / DECOMPRESSION   10-08-2010   L5 - S1, LEFT SIDE ([place])  . ANTERIOR LUMBAR FUSION  07/2014   L4/5, L5/S1 [facility]  . CARDIOVERSION  1996, 2008   x2 in 1996  . COLONOSCOPY  2011   polyp, rec rpt 5 yrs  . CT chest noncontrast  06/2011   f/u L pulm nodule - gone.  Marland Kitchen HIATAL HERNIA REPAIR  1999  . HIP ARTHROSCOPY  09/23/2011   LEFT HIP [place]   . SUBOCCIPITAL CRANIECTOMY CERVICAL LAMINECTOMY  11-30-2000  DR NUDALMAN   C1 AND PARTIAL C2--   CHIARI  MALFORMATION  . URETEROSCOPY  03/02/2012   Procedure: URETEROSCOPY;  Surgeon: Marcine Matar, MD;  [facility];  Service: Urology;  Laterality: Left;  stone obtained    Family History  Problem Relation Age of Onset  . Hypertension Mother   . Heart attack Mother 54  . Heart disease Mother   . Coronary artery disease Mother 52  . Coronary artery disease Father  44       6v CABG  . Heart disease Father 54       CABG  . Diabetes Father   . Alcohol abuse Father   . Liver disease Other        Grandmother (P or M not specified)  . Cancer Paternal Uncle        lung, leukemia  . Hemochromatosis Cousin    Social History:  reports that he has been smoking cigarettes. He has a 20.00 pack-year smoking history. He has never used smokeless tobacco. He reports that he does not drink alcohol or use drugs.  Allergies:  Allergies  Allergen Reactions  . Ibuprofen Swelling    Medications Prior to Admission  Medication Sig Dispense Refill  . acetaminophen (TYLENOL) 500 MG tablet Take 1,000 mg by mouth every 6 (six) hours as needed for moderate pain.    . celecoxib (CELEBREX) 200 MG capsule Take 200 mg by mouth daily.      . flecainide (TAMBOCOR) 100 MG tablet Take 3 tablets at a time, as needed for Atrial Fibrillation. (Patient not taking: Reported on 06/30/2018) 6 tablet 0  . gabapentin (NEURONTIN) 600 MG tablet Take 600 mg by mouth 3 (three) times daily.     . Multiple Vitamin (MULTI VITAMIN PO) Take 1 tablet by mouth daily.    Marland Kitchen  tamsulosin (FLOMAX) 0.4 MG CAPS capsule Take 1 capsule (0.4 mg total) by mouth daily. (Patient not taking: Reported on 06/30/2018) 7 capsule 0    No results found for this or any previous visit (from the past 48 hour(s)). No results found.  Review of Systems  Constitutional: Negative.   HENT: Negative.   Eyes: Negative.   Respiratory: Negative.   Cardiovascular: Negative.   Gastrointestinal: Negative.   Genitourinary: Negative.   Musculoskeletal: Positive for joint pain.  Skin: Negative.   Neurological: Negative.   Endo/Heme/Allergies: Negative.   Psychiatric/Behavioral: Negative.     Blood pressure (!) 147/86, pulse 76, temperature 99.4 F (37.4 C), temperature source Oral, resp. rate 18, height 6\' 3"  (1.905 m), weight 129.7 kg, SpO2 100 %. Physical Exam  Constitutional: He is oriented to person, place, and time.  He appears well-developed.  HENT:  Head: Normocephalic.  Neck: Normal range of motion.  Cardiovascular: Normal rate and intact distal pulses.  Respiratory: Effort normal.  GI: Soft.  Genitourinary:  Genitourinary Comments: Deferred  Musculoskeletal:  Limited strength of left knee. Joint line tenderness. Calf is soft and non tender. Plantar and dorsi flexion intact.   Neurological: He is alert and oriented to person, place, and time.  Skin: Skin is warm and dry.  Psychiatric: His behavior is normal.     Assessment/Plan Left knee TM, OA, and IF Left knee scope as consented D/c home ASA BID F/u in office  Eneida Evers L, PA-C 08/24/2018, 11:31 AM

## 2018-08-24 NOTE — Anesthesia Preprocedure Evaluation (Addendum)
Anesthesia Evaluation  Patient identified by MRN, date of birth, ID band Patient awake    Reviewed: Allergy & Precautions, NPO status , Patient's Chart, lab work & pertinent test results  Airway Mallampati: III  TM Distance: >3 FB Neck ROM: Full    Dental no notable dental hx. (+) Teeth Intact, Dental Advisory Given,    Pulmonary sleep apnea and Continuous Positive Airway Pressure Ventilation , Current Smoker,    Pulmonary exam normal breath sounds clear to auscultation       Cardiovascular Normal cardiovascular exam+ dysrhythmias (on flecainide) Atrial Fibrillation  Rhythm:Regular Rate:Normal     Neuro/Psych  Headaches, Chiari malformation s/p decompression  Chronic back pain- Gabapentin 600 mgs 3x's/day negative psych ROS   GI/Hepatic negative GI ROS, Neg liver ROS,   Endo/Other  negative endocrine ROS  Renal/GU negative Renal ROS  negative genitourinary   Musculoskeletal  (+) Arthritis , Osteoarthritis,    Abdominal   Peds  Hematology negative hematology ROS (+)   Anesthesia Other Findings   Reproductive/Obstetrics                          Anesthesia Physical Anesthesia Plan  ASA: III  Anesthesia Plan: General and Regional   Post-op Pain Management:  Regional for Post-op pain   Induction: Intravenous  PONV Risk Score and Plan: 1 and Ondansetron, Dexamethasone and Midazolam  Airway Management Planned: LMA  Additional Equipment:   Intra-op Plan:   Post-operative Plan: Extubation in OR  Informed Consent: I have reviewed the patients History and Physical, chart, labs and discussed the procedure including the risks, benefits and alternatives for the proposed anesthesia with the patient or authorized representative who has indicated his/her understanding and acceptance.   Dental advisory given  Plan Discussed with: CRNA  Anesthesia Plan Comments:         Anesthesia Quick  Evaluation

## 2018-08-24 NOTE — Transfer of Care (Signed)
Immediate Anesthesia Transfer of Care Note  Patient: Jacob GriffinsSteven R Meyers  Procedure(s) Performed: LEFT KNEE ARTHROSCOPY WITH PARTIAL MEDIAL MENISECTOMY, ARTHROSCOPIC INTERNAL FIXATION MEDIAL FEMORAL CONDYLE AND MEDIAL TIBIAL PLATEAU, CHONDROPLASTY (Left Knee)  Patient Location: PACU  Anesthesia Type:General  Level of Consciousness: awake, alert , oriented and patient cooperative  Airway & Oxygen Therapy: Patient Spontanous Breathing and Patient connected to nasal cannula oxygen  Post-op Assessment: Report given to RN and Post -op Vital signs reviewed and stable  Post vital signs: Reviewed and stable  Last Vitals:  Vitals Value Taken Time  BP 174/105 08/24/2018  2:39 PM  Temp    Pulse 94 08/24/2018  2:41 PM  Resp 12 08/24/2018  2:41 PM  SpO2 96 % 08/24/2018  2:41 PM  Vitals shown include unvalidated device data.  Last Pain:  Vitals:   08/24/18 1132  TempSrc:   PainSc: 3          Complications: No apparent anesthesia complications

## 2018-08-24 NOTE — Anesthesia Procedure Notes (Signed)
Procedure Name: LMA Insertion Date/Time: 08/24/2018 1:35 PM Performed by: Tyrone NineSauve, Laquia Rosano F, CRNA Pre-anesthesia Checklist: Patient identified, Emergency Drugs available, Suction available, Patient being monitored and Timeout performed Patient Re-evaluated:Patient Re-evaluated prior to induction Oxygen Delivery Method: Circle system utilized Preoxygenation: Pre-oxygenation with 100% oxygen Induction Type: IV induction Ventilation: Mask ventilation without difficulty LMA: LMA inserted LMA Size: 5.0 Number of attempts: 1 Placement Confirmation: positive ETCO2,  CO2 detector and breath sounds checked- equal and bilateral Tube secured with: Tape Dental Injury: Teeth and Oropharynx as per pre-operative assessment

## 2018-08-24 NOTE — Anesthesia Procedure Notes (Signed)
Anesthesia Regional Block: Knee block   Pre-Anesthetic Checklist: ,, timeout performed, Correct Patient, Correct Site, Correct Laterality, Correct Procedure, Correct Position, site marked, Risks and benefits discussed,  Surgical consent,  Pre-op evaluation,  At surgeon's request and post-op pain management  Laterality: Left  Prep: Maximum Sterile Barrier Precautions used, chloraprep       Needles:  Injection technique: Single-shot  Needle Type: Quincke     Needle Length: 4cm  Needle Gauge: 18     Additional Needles:   Procedures:,,,, ultrasound used (permanent image in chart),,,,  Narrative:  Start time: 08/24/2018 12:35 PM End time: 08/24/2018 12:45 PM Injection made incrementally with aspirations every 5 mL.  Performed by: Personally  Anesthesiologist: Elmer PickerWoodrum, Deshara Rossi L, MD  Additional Notes: Monitors applied. No increased pain on injection. No increased resistance to injection. Injection made in 5cc increments. Good needle visualization. Patient tolerated procedure well.

## 2018-08-24 NOTE — Discharge Instructions (Signed)
Regional Anesthesia Blocks  1. Numbness or the inability to move the "blocked" extremity may last from 3-48 hours after placement. The length of time depends on the medication injected and your individual response to the medication. If the numbness is not going away after 48 hours, call your surgeon.  2. The extremity that is blocked will need to be protected until the numbness is gone and the  Strength has returned. Because you cannot feel it, you will need to take extra care to avoid injury. Because it may be weak, you may have difficulty moving it or using it. You may not know what position it is in without looking at it while the block is in effect.  3. For blocks in the legs and feet, returning to weight bearing and walking needs to be done carefully. You will need to wait until the numbness is entirely gone and the strength has returned. You should be able to move your leg and foot normally before you try and bear weight or walk. You will need someone to be with you when you first try to ensure you do not fall and possibly risk injury.  4. Bruising and tenderness at the needle site are common side effects and will resolve in a few days.  5. Persistent numbness or new problems with movement should be communicated to the surgeon or [facility] [phone]).   Post Anesthesia Home Care Instructions  Activity: Get plenty of rest for the remainder of the day. A responsible individual must stay with you for 24 hours following the procedure.  For the next 24 hours, DO NOT: -Drive a car -Advertising copywriterperate machinery -Drink alcoholic beverages -Take any medication unless instructed by your physician -Make any legal decisions or sign important papers.  Meals: Start with liquid foods such as gelatin or soup. Progress to regular foods as tolerated. Avoid greasy, spicy, heavy foods. If nausea and/or vomiting occur, drink only clear liquids until the nausea and/or vomiting subsides. Call your  physician if vomiting continues.  Special Instructions/Symptoms: Your throat may feel dry or sore from the anesthesia or the breathing tube placed in your throat during surgery. If this causes discomfort, gargle with warm salt water. The discomfort should disappear within 24 hours.  If you had a scopolamine patch placed behind your ear for the management of post- operative nausea and/or vomiting:  1. The medication in the patch is effective for 72 hours, after which it should be removed.  Wrap patch in a tissue and discard in the trash. Wash hands thoroughly with soap and water. 2. You may remove the patch earlier than 72 hours if you experience unpleasant side effects which may include dry mouth, dizziness or visual disturbances. 3. Avoid touching the patch. Wash your hands with soap and water after contact with the patch.

## 2018-08-24 NOTE — Interval H&P Note (Signed)
History and Physical Interval Note:  08/24/2018 11:39 AM  [name]  has presented today for surgery, with the diagnosis of Left knee torn medial meniscus, osteoarthritis, insufficiency fracture medial femoral condyle and medial tibial plateau  The various methods of treatment have been discussed with the patient and family. After consideration of risks, benefits and other options for treatment, the patient has consented to  Procedure(s): LEFT KNEE ARTHROSCOPY WITH PARTIAL MEDIAL MENISECTOMY, ARTHROSCOPIC INTERNAL FIXATION MEDIAL FEMORAL CONDYLE AND MEDIAL TIBIAL PLATEAU, CHONDROPLASTY (Left) as a surgical intervention .  The patient's history has been reviewed, patient examined, no change in status, stable for surgery.  I have reviewed the patient's chart and labs.  Questions were answered to the patient's satisfaction.     [name] ANDREW

## 2018-08-24 NOTE — Op Note (Signed)
NAMEASHE, GAGO MEDICAL RECORD [id number] ACCOUNT [id number] DATE OF BIRTH:05-13-1971 FACILITY: [facility] LOCATION: [facility] PHYSICIAN:Abbrielle Batts Jim Desanctis, MD  OPERATIVE REPORT  DATE OF PROCEDURE:  08/24/2018  PREOPERATIVE DIAGNOSES: 1.  Left knee torn medial meniscus. 2.  Osteoarthritis. 3.  Subchondral insufficiency fracture medial femoral condyle and medial tibial plateau, left knee.  POSTOPERATIVE DIAGNOSES: 1.  Left knee torn medial meniscus. 2.  Osteoarthrosis grade III medial compartment, grade II lateral compartment. 3.  Subchondral insufficiency fracture medial femoral condyle and medial tibial plateau, left knee.  PROCEDURE PERFORMED: 1.  Left knee arthroscopic partial medial meniscectomy and chondroplasty. 2.  Arthroscopic assisted internal fixation of medial femoral condyle insufficiency fracture. 3.  Arthroscopic assisted internal fixation medial tibial plateau insufficiency fracture.  SURGEON:  Arlan Organ, MD  ASSISTANT:  Arsenio Loader, PA-C.  ANESTHESIA:  Knee block with general.  ESTIMATED BLOOD LOSS:  Less than 10 mL.  DRAINS:  None.  COMPLICATIONS:  None.  TOURNIQUET TIME:  35 minutes at 300 mmHg.   DISPOSITION:  PACU stable.  DESCRIPTION OF PROCEDURE:  The patient counseled in holding area, correct site identified, marked and signed appropriately.  IV started.  A skin block was administered.  He was taken to the operating room and placed in supine position, general anesthesia  and IV antibiotics were given.  TED hose applied to uninvolved leg.  Left lower extremity elevated and prepped with DuraPrep and draped in sterile fashion.  Time out was done, confirming the left side, exsanguinated with an Esmarch, tourniquet inflated  to 300 mmHg.  Arthroscopic portals were established proximal inferomedial, inferolateral diagnostic arthroscopy.  Patellofemoral joint revealed no new significant chondromalacia.  ACL and PCL intact.  The suprapatellar  pouch, medial and lateral gutters  unremarkable.  Medial compartment inspected.  Grade III chondromalacia of medial femoral condyle.  Chondroplasty was then established with mechanical shaver back to stable base.  Radial tear posterior medial meniscus with shaver and cautery system.   Partial meniscectomy performed to stable base with proper bevel contoured.  The lateral side inspected.  Lateral meniscus was intact.  Minimal chondromalacia.  Did not require chondroplasty.  The C-arm was brought in to localize the medial femoral  condyle stress reaction and also tibial plateau stress reaction, fracture.  Based upon the AP and lateral planes based on the MRI visualized.  After confirming this then they were separately filled with [employer] AccuFill to the appropriate level of  pressure.  This was done with serially turning his trocars for circumferential deposition of AccuFill.  I rescoped the knee at this point in time.  There was no extravasation of AccuFill in the joint nor soft tissues.  After the cement had cured, the  trocars were removed.  Portals closed with #1 suture.  Another 10 mL Sensorcaine placed in skin.  Sterile dressing applied to the knee, TED hose, ice pack.  Tourniquet was deflated after application of the dressing.  No complications or problems.  He is  awakened and taken t from the operating room to PACU in stable condition.  He will be stabilized in PACU and discharged home.  He will be seen in my office next week.  Recommend a lateral heel wedge and short course of therapy.  Prognosis is very good.  Arsenio Loader, PA-C, helped with patient position, prep and draping, technical and surgical assistant throughout the entire case wound closure, dressing and splint and image intensification reading and adjustments.  Mr. Arsenio Loader, PA-C's,  assistance was needed  throughout the entire case.  TN/NUANCE  D:08/24/2018 T:08/24/2018 JOB:[id number]

## 2018-08-24 NOTE — Anesthesia Postprocedure Evaluation (Signed)
Anesthesia Post Note  Patient: [name]  Procedure(s) Performed: LEFT KNEE ARTHROSCOPY WITH PARTIAL MEDIAL MENISECTOMY, ARTHROSCOPIC INTERNAL FIXATION MEDIAL FEMORAL CONDYLE AND MEDIAL TIBIAL PLATEAU, CHONDROPLASTY (Left Knee)     Patient location during evaluation: PACU Anesthesia Type: Regional and General Level of consciousness: awake and alert, oriented and patient cooperative Pain management: pain level controlled Vital Signs Assessment: post-procedure vital signs reviewed and stable Respiratory status: spontaneous breathing, nonlabored ventilation and respiratory function stable Cardiovascular status: blood pressure returned to baseline and stable Postop Assessment: no apparent nausea or vomiting and adequate PO intake Anesthetic complications: no    Last Vitals:  Vitals:   08/24/18 1515 08/24/18 1530  BP:  (!) 152/99  Pulse: 88 82  Resp: 16 16  Temp:    SpO2: 95% 96%    Last Pain:  Vitals:   08/24/18 1515  TempSrc:   PainSc: 6                  Kursten Kruk,E. Clotee Schlicker

## 2018-08-24 NOTE — Op Note (Signed)
Dictated#[id number]

## 2018-08-25 ENCOUNTER — Encounter (HOSPITAL_BASED_OUTPATIENT_CLINIC_OR_DEPARTMENT_OTHER): Payer: Self-pay | Admitting: Specialist

## 2019-01-02 DIAGNOSIS — M5136 Other intervertebral disc degeneration, lumbar region: Secondary | ICD-10-CM | POA: Insufficient documentation

## 2019-01-02 DIAGNOSIS — M961 Postlaminectomy syndrome, not elsewhere classified: Secondary | ICD-10-CM | POA: Insufficient documentation

## 2019-06-05 ENCOUNTER — Telehealth: Payer: Self-pay | Admitting: Family Medicine

## 2019-06-05 NOTE — Telephone Encounter (Signed)
Received preop clearance request from [facility] for knee replacement.  Pt last seen in our office 2017.  Pt would need 30 min preop eval - would offer to schedule at his convenience. I will hold onto form.

## 2019-06-05 NOTE — Telephone Encounter (Signed)
Pt is scheduled for 06/26/19 @ 3:30pm

## 2019-06-09 IMAGING — CR DG ABDOMEN 1V
2 series · 2 of 2 positions shown · non-contrast
Comparison: Radiographs October 26, 2013.

CLINICAL DATA: Right-sided abdominal pain.

EXAM:
ABDOMEN - 1 VIEW

[t abdomen supine (1 of 2)]
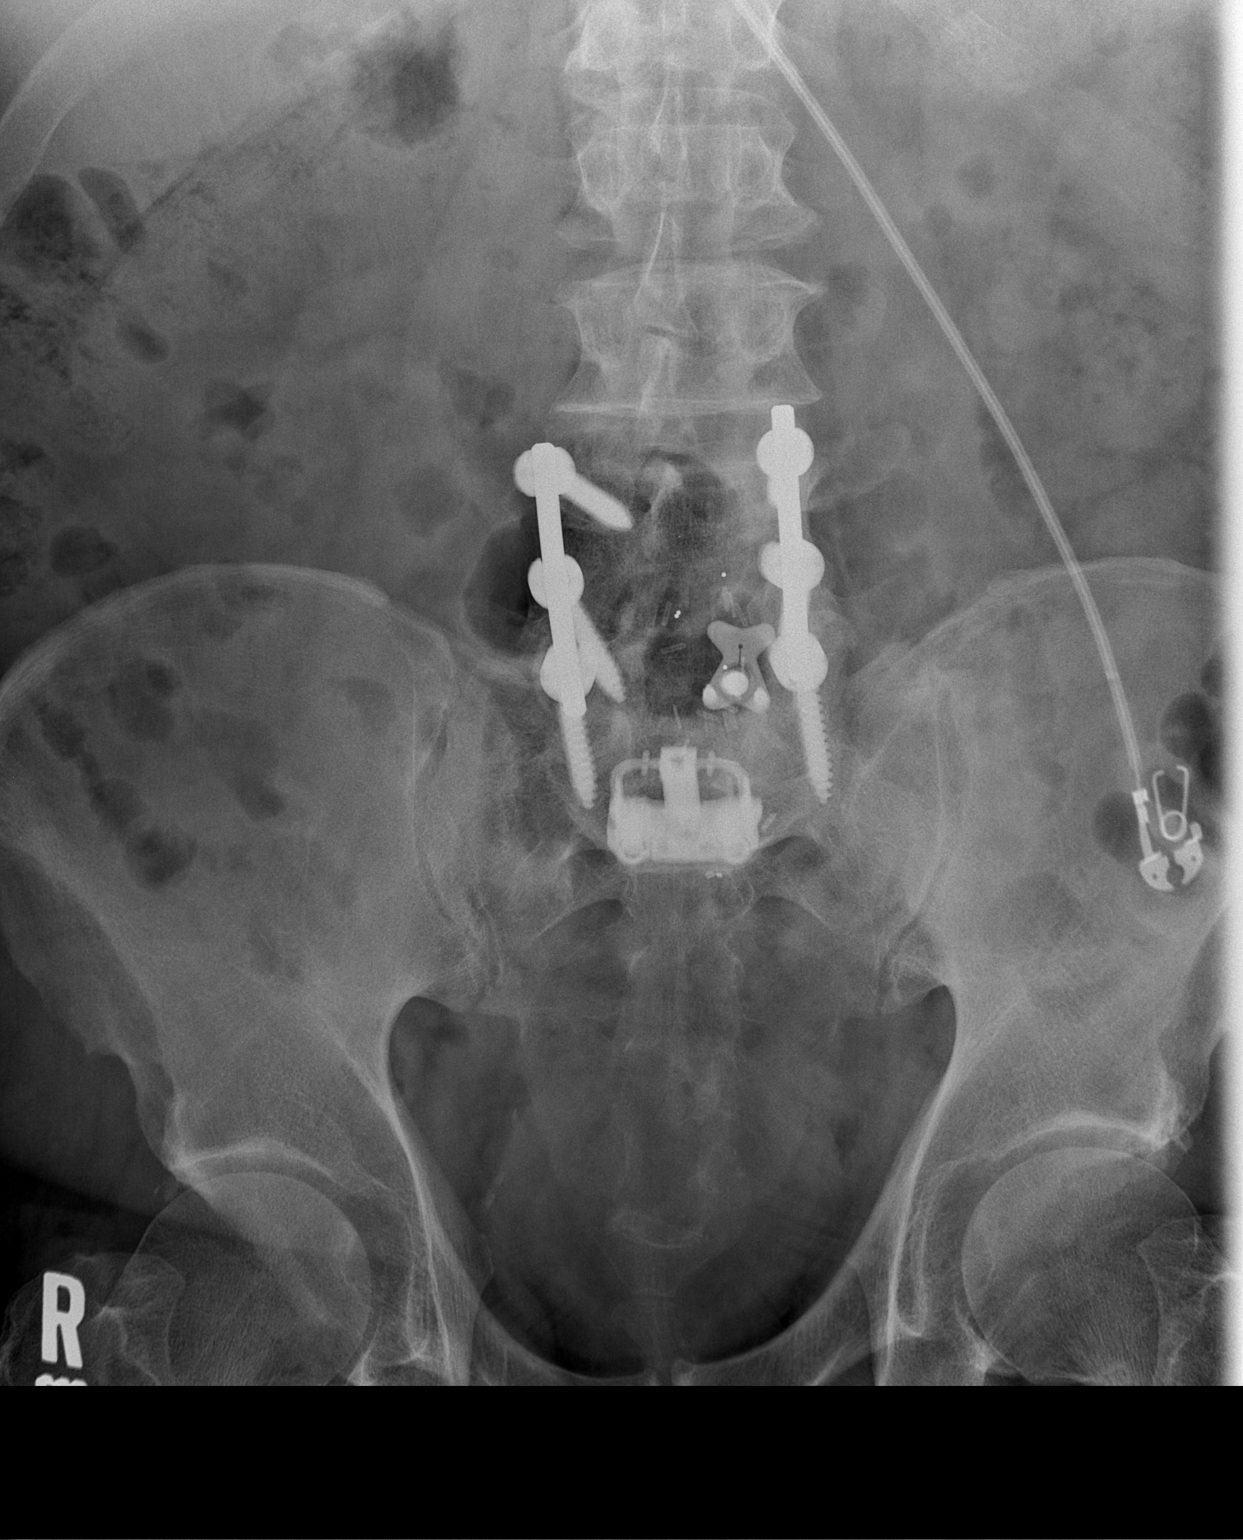

[t abdomen supine (2 of 2)]
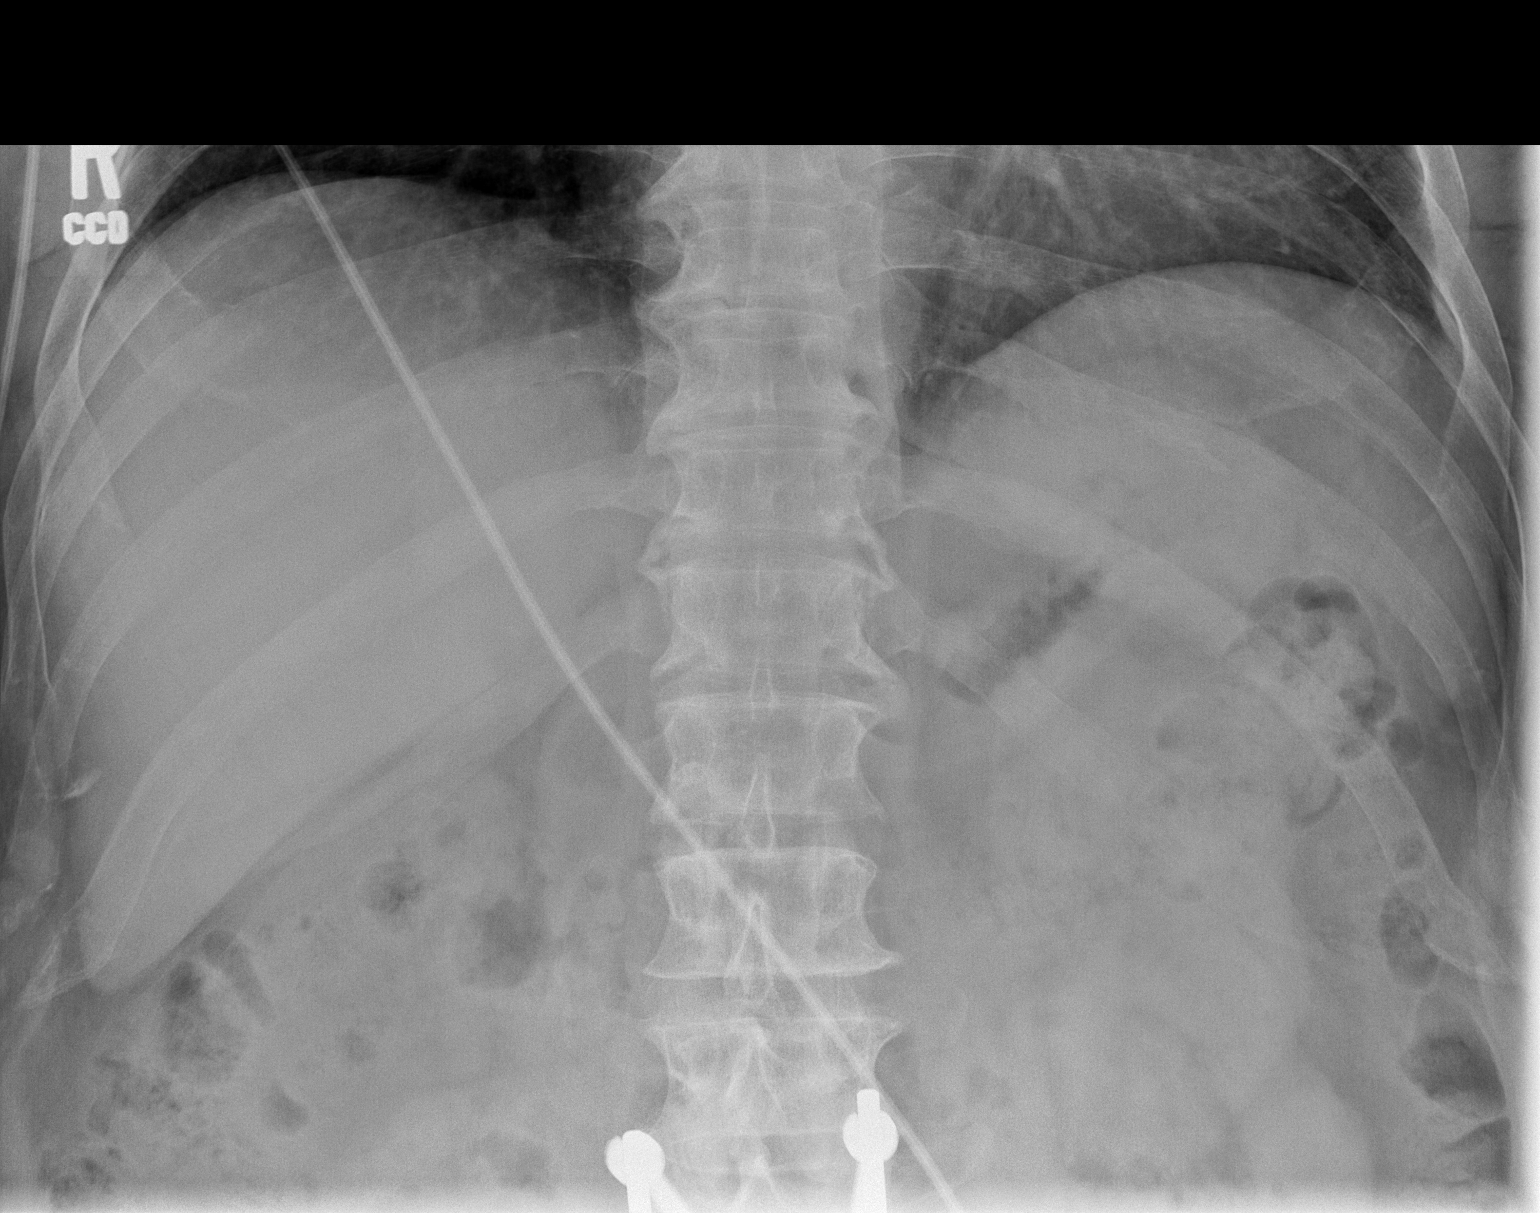

[2 of 2 positions shown; findings below may reference images not displayed]

FINDINGS: The bowel gas pattern is normal. No radio-opaque calculi or other
significant radiographic abnormality are seen. Postsurgical changes
are noted in lower lumbar spine.
IMPRESSION: No evidence of bowel obstruction or ileus.

## 2019-06-09 IMAGING — CT CT RENAL STONE PROTOCOL
2 of 4 series · 17 of 46 positions shown, 19 images · non-contrast
Comparison: 02/04/2012

CLINICAL DATA: Right flank pain for 1 week.

EXAM:
CT ABDOMEN AND PELVIS WITHOUT CONTRAST
TECHNIQUE: Multidetector CT imaging of the abdomen and pelvis was performed
following the standard protocol without IV contrast.

[Series 2: axial st · axial · 0.85mm/px · z∈[-536,-81]mm · 14 of 103 slices shown, 16 images]
[im 6/103  soft-tissue]
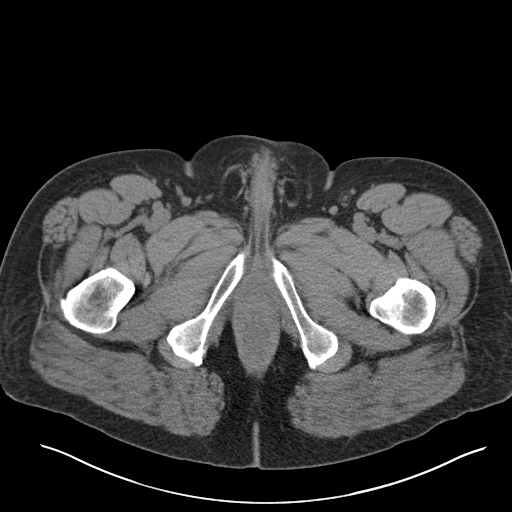
[im 6/103  bone]
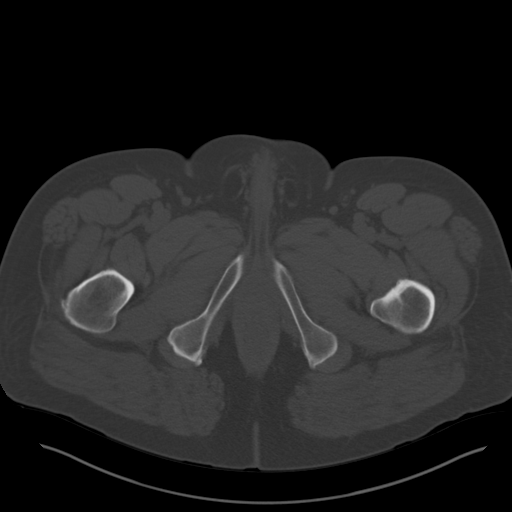
[im 11/103  soft-tissue]
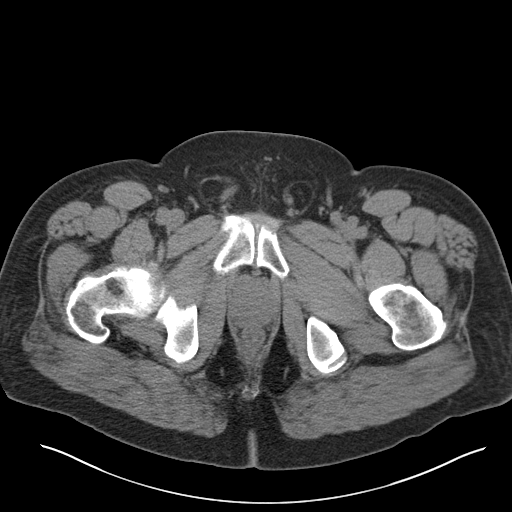
[im 22/103  soft-tissue]
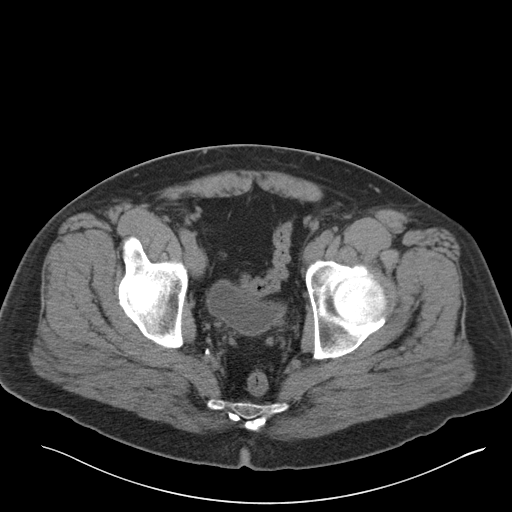
[im 27/103  soft-tissue]
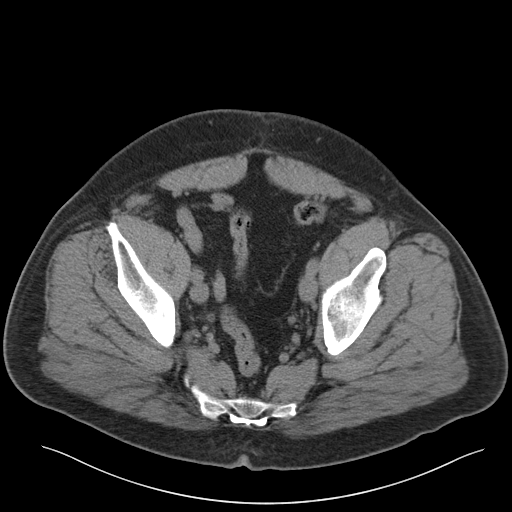
[im 33/103  soft-tissue]
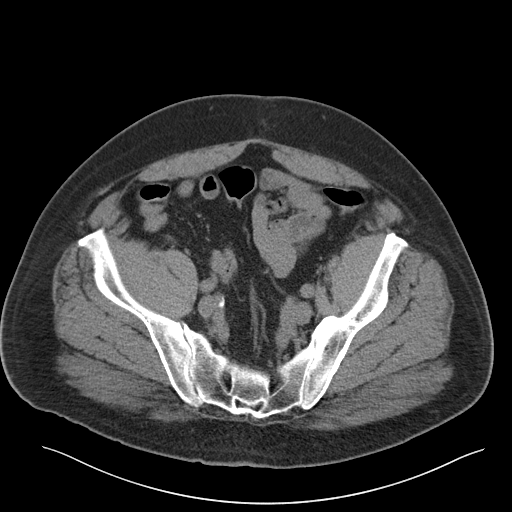
[im 43/103  soft-tissue]
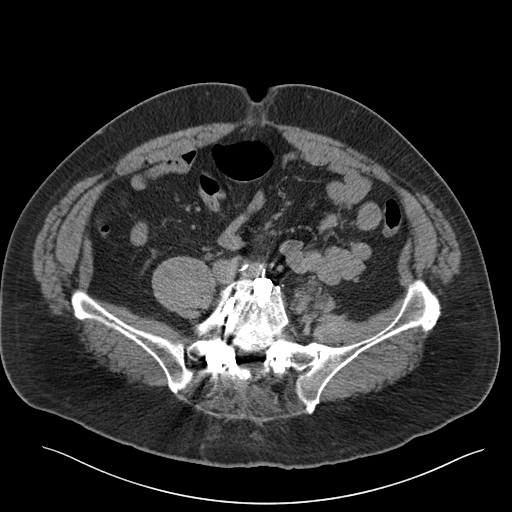
[im 49/103  soft-tissue]
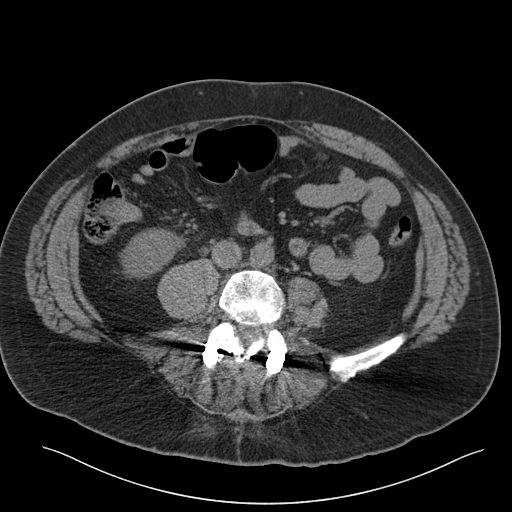
[im 54/103  soft-tissue]
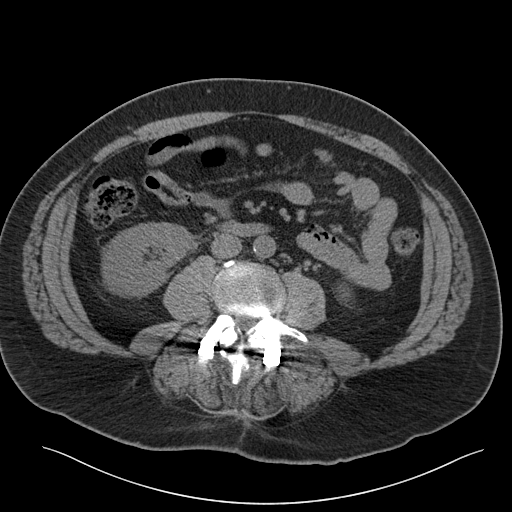
[im 60/103  soft-tissue]
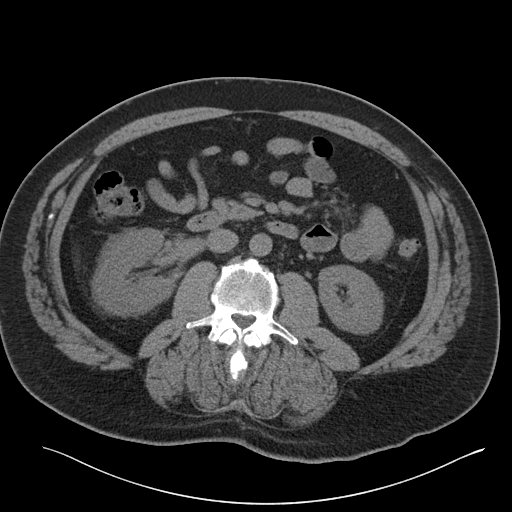
[im 60/103  bone]
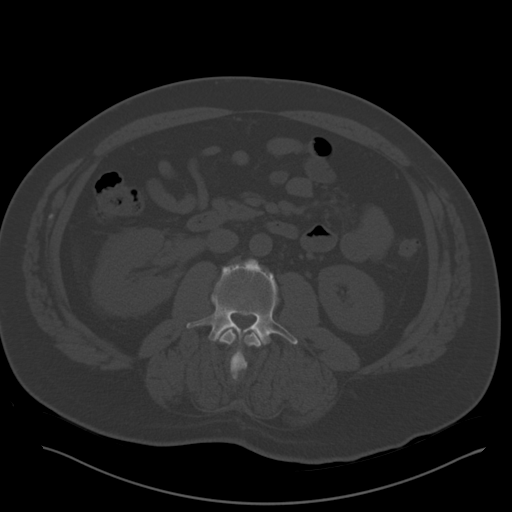
[im 70/103  soft-tissue]
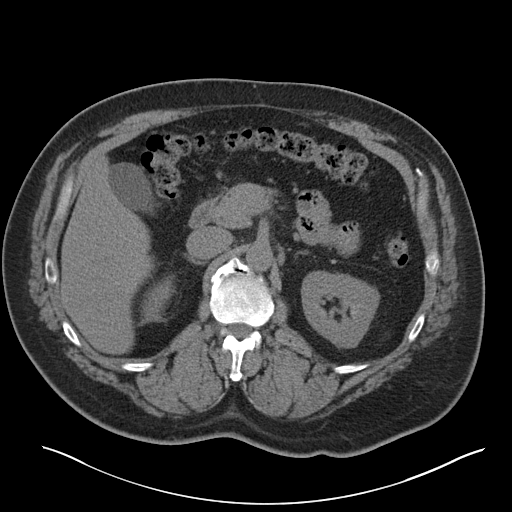
[im 76/103  soft-tissue]
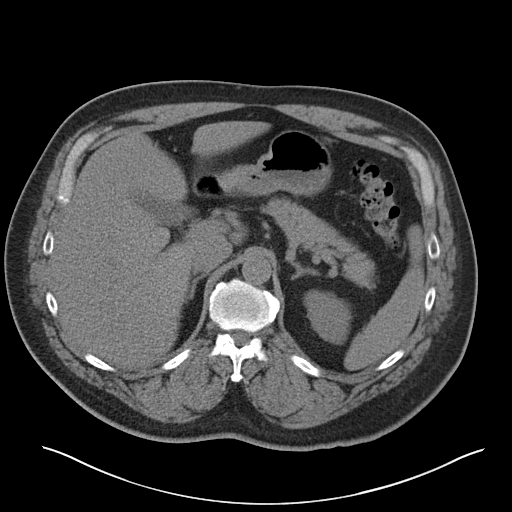
[im 81/103  soft-tissue]
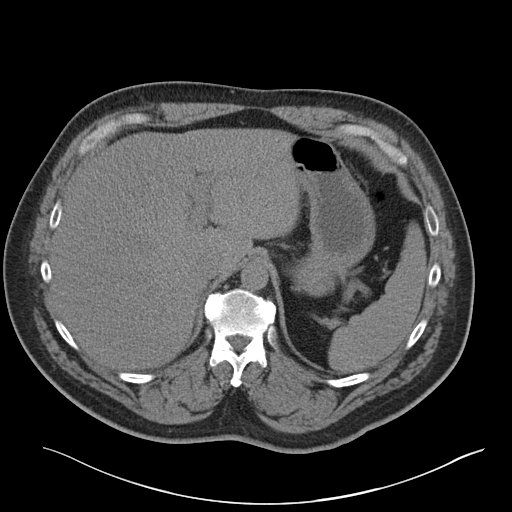
[im 92/103  soft-tissue]
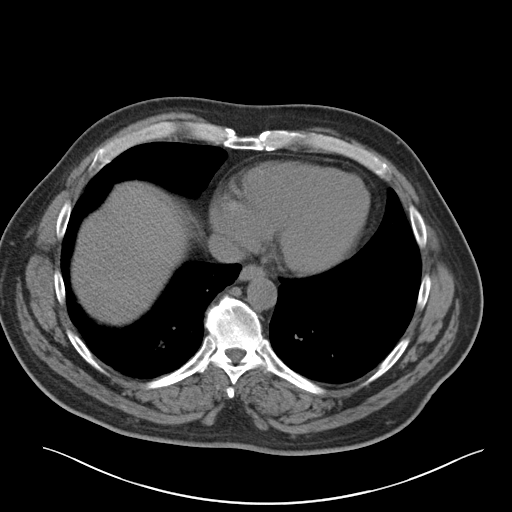
[im 97/103  soft-tissue]
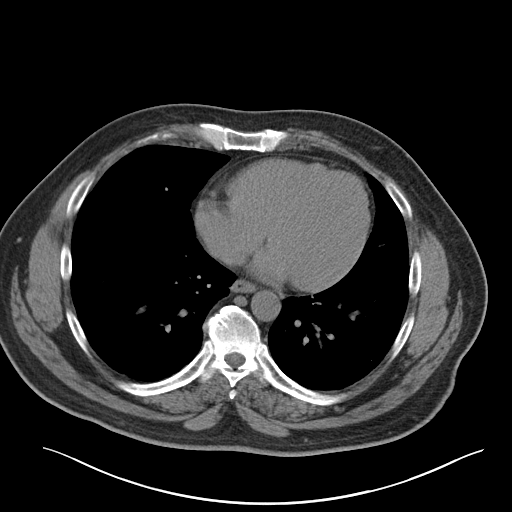

[Series 4: coronal · coronal · 0.88mm/px · 3 of 164 slices shown]
[im 55/164  soft-tissue]
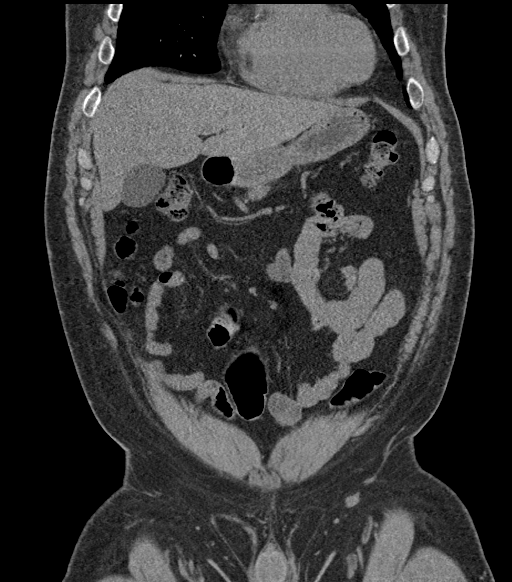
[im 73/164  soft-tissue]
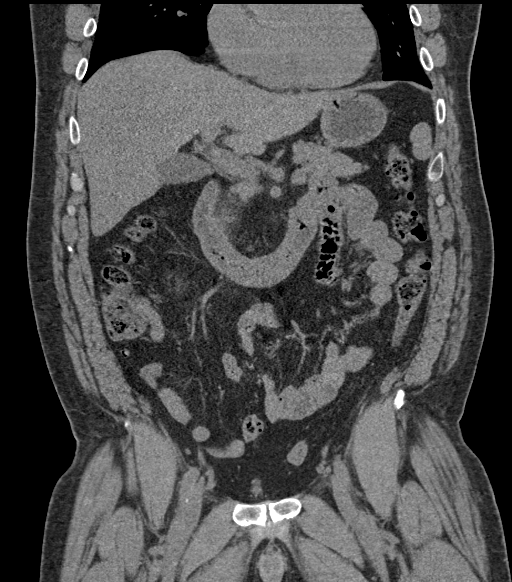
[im 91/164  soft-tissue]
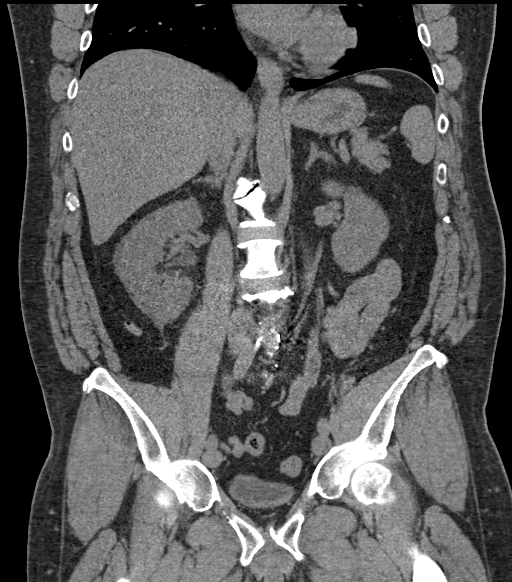

[17 of 46 positions shown; findings below may reference images not displayed]

FINDINGS: Lower chest: Left lower lobe calcified granuloma.

Hepatobiliary: Liver is unremarkable.  Small gallstone.

Pancreas: Unremarkable

Spleen: Unremarkable

Adrenals/Urinary Tract: Mild right hydronephrosis. Left kidney is
unremarkable. Stranding along the right ureter. 2 mm calculus in the
distal right ureter. Linear calcified object measuring 4 mm at the
left bladder base is either a passed calculus or a stone at the
level of the left ureterovesical junction.

Stomach/Bowel: Normal appendix. No mass in the colon. No evidence of
small-bowel obstruction. Stomach and duodenum are unremarkable.

Vascular/Lymphatic: No abnormal retroperitoneal adenopathy.
Atherosclerotic calcifications of the aorta and iliac arteries.

Reproductive: Normal prostate.

Other: No free fluid.

Musculoskeletal: Hardware is in place for L4 through S1 fusion. No
vertebral compression deformity.
IMPRESSION: 2 mm distal right ureteral calculus is associated with right
hydronephrosis.

There is a small calculus at the left bladder base that is either a
passed calculus, or just at the left ureterovesical junction. There
is no evidence of left hydronephrosis.

Cholelithiasis.

## 2019-06-26 ENCOUNTER — Other Ambulatory Visit: Payer: Self-pay

## 2019-06-26 ENCOUNTER — Ambulatory Visit (INDEPENDENT_AMBULATORY_CARE_PROVIDER_SITE_OTHER)
Admission: RE | Admit: 2019-06-26 | Discharge: 2019-06-26 | Disposition: A | Discharge status: Home / Self Care | Payer: 59 | Source: Ambulatory Visit | Attending: Family Medicine | Admitting: Family Medicine

## 2019-06-26 ENCOUNTER — Encounter: Payer: Self-pay | Admitting: Family Medicine

## 2019-06-26 ENCOUNTER — Ambulatory Visit: Payer: 59 | Admitting: Family Medicine

## 2019-06-26 VITALS — BP 120/66 | HR 83 | Temp 97.7°F | Ht 75.0 in | Wt 285.1 lb

## 2019-06-26 DIAGNOSIS — Z8249 Family history of ischemic heart disease and other diseases of the circulatory system: Secondary | ICD-10-CM

## 2019-06-26 DIAGNOSIS — M1712 Unilateral primary osteoarthritis, left knee: Secondary | ICD-10-CM | POA: Diagnosis not present

## 2019-06-26 DIAGNOSIS — F17200 Nicotine dependence, unspecified, uncomplicated: Secondary | ICD-10-CM

## 2019-06-26 DIAGNOSIS — I480 Paroxysmal atrial fibrillation: Secondary | ICD-10-CM

## 2019-06-26 DIAGNOSIS — Z01818 Encounter for other preprocedural examination: Secondary | ICD-10-CM | POA: Diagnosis not present

## 2019-06-26 DIAGNOSIS — Z9989 Dependence on other enabling machines and devices: Secondary | ICD-10-CM

## 2019-06-26 DIAGNOSIS — G4733 Obstructive sleep apnea (adult) (pediatric): Secondary | ICD-10-CM

## 2019-06-26 NOTE — Assessment & Plan Note (Signed)
RCRI = 0.  Check CXR, EKG, labs today including BNP.  If all normal, anticipate adequately low risk to proceed with surgery. Will forward results to ortho.

## 2019-06-26 NOTE — Assessment & Plan Note (Signed)
End stage. S/p arthroscopy last year. Planned knee replacement later this year.

## 2019-06-26 NOTE — Patient Instructions (Addendum)
EKG today Chest xray today  Labs today.  Return at your convenience for physical, prior for fasting labs.  Once we receive results, we will send requested records to Dr Theda Sers.

## 2019-06-26 NOTE — Assessment & Plan Note (Signed)
Continue to encourage full cessation.  Precontemplative. 

## 2019-06-26 NOTE — Progress Notes (Signed)
This visit was conducted in person.  BP 120/66 (BP Location: Left Arm, Patient Position: Sitting, Cuff Size: Large)   Pulse 83   Temp 97.7 F (36.5 C) (Temporal)   Ht 6\' 3"  (1.905 m)   Wt 285 lb 1 oz (129.3 kg)   SpO2 95%   BMI 35.63 kg/m    CC: preop evaluation Subjective:    Patient ID: Susa GriffinsSteven R Novakovich, male    DOB: 02/02/1971, 48 y.o.   MRN: [id number]  HPI: Susa GriffinsSteven R Leland is a 48 y.o. male presenting on 06/26/2019 for Pre-op Exam (Has TKR scheduled on 07/20/19. )   Last seen in our office 04/2016, by me 05/2014.  MIL under hospice not doing well. Wife Babette Relicammy is taking care of her.   Here for preop eval for upcoming L total knee replacement under spinal anesthesia by Dr Thomasena Edisollins ([facility]) scheduled for the end of the month. Activity limiting pain. Currently managing with gabapentin, celebrex, naprosyn. Known h/o diffuse osteoarthritis.   Denies chest pain, tightness, dyspnea, dizziness, headaches, abd pain, blood in stool.   Has tolerated general anesthesia in the past, latest surgery for L knee 2019.   Current every day smoker 1 ppd.   Strong fmhx CAD. Father had bypass age 50yo.  Compliant on CPAP for OSA.      Relevant past medical, surgical, family and social history reviewed and updated as indicated. Interim medical history since our last visit reviewed. Allergies and medications reviewed and updated. Outpatient Medications Prior to Visit  Medication Sig Dispense Refill  . acetaminophen (TYLENOL) 500 MG tablet Take 1,000 mg by mouth every 6 (six) hours as needed for moderate pain.    . celecoxib (CELEBREX) 200 MG capsule Take 200 mg by mouth daily.      Marland Kitchen. gabapentin (NEURONTIN) 600 MG tablet Take 600 mg by mouth 3 (three) times daily.     . Multiple Vitamin (MULTI VITAMIN PO) Take 1 tablet by mouth daily.    Marland Kitchen. NAPROXEN PO Take 2 tablets by mouth daily.    Marland Kitchen. aspirin EC 325 MG tablet Take 1 tablet (325 mg total) by mouth 2 (two) times daily. 60 tablet 0  . cephALEXin  (KEFLEX) 500 MG capsule Take 1 capsule (500 mg total) by mouth 3 (three) times daily. 12 capsule 0   No facility-administered medications prior to visit.     Past Medical History:  Diagnosis Date  . Arnold-Chiari malformation (HCC) 2002   s/p decompression  . 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 Colon polyps 2011   rpt Q5 yrs  . DDD (degenerative disc disease) SPINE  . History of kidney stones   . HLD (hyperlipidemia)   . Internal hemorrhoids   . OSA on CPAP DR CLANCE   CPAP  . Paroxysmal A-fib (HCC) CARDIOLOGIST-  DR Graciela HusbandsKLEIN-- LAST VISIT 10-22-2010 IN [facility]   controlled with flecainide 300mg  prn  . Seasonal allergies     Past Surgical History:  Procedure Laterality Date  . ADENOIDECTOMY  AGE 66  . ANTERIOR LUMBAR DISKECTOMY / DECOMPRESSION   10-08-2010   L5 - S1, LEFT SIDE ([place])  . ANTERIOR LUMBAR FUSION  07/2014   L4/5, L5/S1 [facility]  . CARDIOVERSION  1996, 2008   x2 in 1996  . COLONOSCOPY  2011   polyp, rec rpt 5 yrs  . CT chest noncontrast  06/2011   f/u L pulm nodule - gone.  Marland Kitchen. HIATAL HERNIA REPAIR  [place] ARTHROSCOPY  09/23/2011   LEFT [facility]   . KNEE ARTHROSCOPY WITH MEDIAL MENISECTOMY Left 08/24/2018   Procedure: LEFT KNEE ARTHROSCOPY WITH PARTIAL MEDIAL MENISECTOMY, ARTHROSCOPIC INTERNAL FIXATION MEDIAL FEMORAL CONDYLE AND MEDIAL TIBIAL PLATEAU, CHONDROPLASTY;  Surgeon: Sydnee Cabal, MD;  Location: [place];  Service: Orthopedics;  Laterality: Left;  . SUBOCCIPITAL CRANIECTOMY CERVICAL LAMINECTOMY  11-30-2000  DR NUDALMAN   C1 AND PARTIAL C2--   CHIARI  MALFORMATION  . URETEROSCOPY  03/02/2012   Procedure: URETEROSCOPY;  Surgeon: Franchot Gallo, MD;  Location[facility];  Service: Urology;  Laterality: Left;  stone obtained    Family History  Problem Relation Age of Onset  . Hypertension Mother   . Heart attack Mother 74  . Heart disease Mother   . Coronary artery disease  Mother 40  . Coronary artery disease Father 34       6v CABG  . Heart disease Father 57       CABG  . Diabetes Father   . Alcohol abuse Father   . Liver cancer Father 24  . Liver disease Other        Grandmother (P or M not specified)  . Cancer Paternal Uncle        lung, leukemia  . Suicidality Paternal Uncle   . Hemochromatosis Cousin     Social History   Tobacco Use  . Smoking status: Current Every Day Smoker    Packs/day: 1.00    Years: 20.00    Pack years: 20.00    Types: Cigarettes  . Smokeless tobacco: Never Used  . Tobacco comment: pack per day 04/12/2018   Substance Use Topics  . Alcohol use: No  . Drug use: No    Per HPI unless specifically indicated in ROS section below Review of Systems Objective:    BP 120/66 (BP Location: Left Arm, Patient Position: Sitting, Cuff Size: Large)   Pulse 83   Temp 97.7 F (36.5 C) (Temporal)   Ht 6\' 3"  (1.905 m)   Wt 285 lb 1 oz (129.3 kg)   SpO2 95%   BMI 35.63 kg/m   Wt Readings from Last 3 Encounters:  06/26/19 285 lb 1 oz (129.3 kg)  08/24/18 285 lb 14.4 oz (129.7 kg)  06/30/18 296 lb 3.2 oz (134.4 kg)    Physical Exam Vitals signs and nursing note reviewed.  Constitutional:      General: He is not in acute distress.    Appearance: Normal appearance. He is not ill-appearing.  HENT:     Mouth/Throat:     Mouth: Mucous membranes are moist.     Pharynx: Oropharynx is clear. No posterior oropharyngeal erythema.  Eyes:     Extraocular Movements: Extraocular movements intact.     Pupils: Pupils are equal, round, and reactive to light.  Neck:     Thyroid: No thyroid mass, thyromegaly or thyroid tenderness.  Cardiovascular:     Rate and Rhythm: Normal rate and regular rhythm.     Pulses: Normal pulses.     Heart sounds: Normal heart sounds. No murmur.  Pulmonary:     Effort: Pulmonary effort is normal. No respiratory distress.     Breath sounds: Normal breath sounds. No wheezing, rhonchi or rales.   Musculoskeletal:     Right lower leg: No edema.     Left lower leg: No edema.  Lymphadenopathy:     Cervical: No cervical adenopathy.  Neurological:  Mental Status: He is alert.  Psychiatric:        Mood and Affect: Mood normal.        Behavior: Behavior normal.       Results for orders placed or performed during the hospital encounter of 08/24/18  I-STAT 4, (NA,K, GLUC, HGB,HCT)  Result Value Ref Range   Sodium 141 135 - 145 mmol/L   Potassium 3.9 3.5 - 5.1 mmol/L   Glucose, Bld 88 70 - 99 mg/dL   HCT 35.3 61.4 - 43.1 %   Hemoglobin 14.6 13.0 - 17.0 g/dL   EKG - NSR rate 75, normal axis, intervals, no acute ST/T changes, unchanged from prior Assessment & Plan:   Problem List Items Addressed This Visit    Smoker    Continue to encourage full cessation. Precontemplative.       Preoperative clearance - Primary    RCRI = 0.  Check CXR, EKG, labs today including BNP.  If all normal, anticipate adequately low risk to proceed with surgery. Will forward results to ortho.       Relevant Orders   EKG 12-Lead (Completed)   Comprehensive metabolic panel   CBC with Differential/Platelet   Brain natriuretic peptide   Protime-INR   DG Chest 2 View   Osteoarthritis of left knee    End stage. S/p arthroscopy last year. Planned knee replacement later this year.       Relevant Medications   NAPROXEN PO   OSA on CPAP    Regularly followed by pulmonology Dr Vassie Loll      Family history of premature CAD    Strong fmhx CAD, father age 36. Check FLP when he returns fasting for CPE. Consider further risks stratification, statin, cardiac calcium score.       ATRIAL FIBRILLATION, PAROXYSMAL    Remote h/o this, prior on fleicainide. chadsvasc2 score = 0. No recurrence, not on anticoagulant.           No orders of the defined types were placed in this encounter.  Orders Placed This Encounter  Procedures  . DG Chest 2 View    Standing Status:   Future    Number of  Occurrences:   1    Standing Expiration Date:   08/25/2020    Order Specific Question:   Reason for Exam (SYMPTOM  OR DIAGNOSIS REQUIRED)    Answer:   preop eval    Order Specific Question:   Preferred imaging location?    Answer:   [facility]    Order Specific Question:   Radiology Contrast Protocol - do NOT remove file path    Answer:   [email or web address]  . Comprehensive metabolic panel  . CBC with Differential/Platelet  . Brain natriuretic peptide  . Protime-INR  . EKG 12-Lead    Patient Instructions  EKG today Chest xray today  Labs today.  Return at your convenience for physical, prior for fasting labs.  Once we receive results, we will send requested records to Dr Thomasena Edis.    Follow up plan: Return for annual exam, prior fasting for blood work.  Eustaquio Boyden, MD

## 2019-06-26 NOTE — Assessment & Plan Note (Addendum)
Remote h/o this, prior on fleicainide. chadsvasc2 score = 0. No recurrence, not on anticoagulant.

## 2019-06-26 NOTE — Assessment & Plan Note (Addendum)
Strong fmhx CAD, father age 50. Check FLP when he returns fasting for CPE. Consider further risks stratification, statin, cardiac calcium score.

## 2019-06-26 NOTE — Assessment & Plan Note (Addendum)
Regularly followed by pulmonology Dr Elsworth Soho

## 2019-06-27 ENCOUNTER — Ambulatory Visit (INDEPENDENT_AMBULATORY_CARE_PROVIDER_SITE_OTHER): Payer: 59 | Admitting: Pulmonary Disease

## 2019-06-27 ENCOUNTER — Encounter: Payer: Self-pay | Admitting: Pulmonary Disease

## 2019-06-27 DIAGNOSIS — Z9989 Dependence on other enabling machines and devices: Secondary | ICD-10-CM

## 2019-06-27 DIAGNOSIS — F17200 Nicotine dependence, unspecified, uncomplicated: Secondary | ICD-10-CM

## 2019-06-27 DIAGNOSIS — Z23 Encounter for immunization: Secondary | ICD-10-CM | POA: Diagnosis not present

## 2019-06-27 DIAGNOSIS — G4733 Obstructive sleep apnea (adult) (pediatric): Secondary | ICD-10-CM

## 2019-06-27 LAB — CBC WITH DIFFERENTIAL/PLATELET
Basophils Absolute: 0.2 10*3/uL — ABNORMAL HIGH (ref 0.0–0.1)
Basophils Relative: 1.8 % (ref 0.0–3.0)
Eosinophils Absolute: 0.2 10*3/uL (ref 0.0–0.7)
Eosinophils Relative: 1.7 % (ref 0.0–5.0)
HCT: 43.4 % (ref 39.0–52.0)
Hemoglobin: 14.3 g/dL (ref 13.0–17.0)
Lymphocytes Relative: 24.8 % (ref 12.0–46.0)
Lymphs Abs: 2.9 10*3/uL (ref 0.7–4.0)
MCHC: 32.9 g/dL (ref 30.0–36.0)
MCV: 84.1 fl (ref 78.0–100.0)
Monocytes Absolute: 0.8 10*3/uL (ref 0.1–1.0)
Monocytes Relative: 6.5 % (ref 3.0–12.0)
Neutro Abs: 7.7 10*3/uL (ref 1.4–7.7)
Neutrophils Relative %: 65.2 % (ref 43.0–77.0)
Platelets: 239 10*3/uL (ref 150.0–400.0)
RBC: 5.16 Mil/uL (ref 4.22–5.81)
RDW: 14.5 % (ref 11.5–15.5)
WBC: 11.8 10*3/uL — ABNORMAL HIGH (ref 4.0–10.5)

## 2019-06-27 LAB — COMPREHENSIVE METABOLIC PANEL
ALT: 12 U/L (ref 0–53)
AST: 15 U/L (ref 0–37)
Albumin: 4.3 g/dL (ref 3.5–5.2)
Alkaline Phosphatase: 63 U/L (ref 39–117)
BUN: 22 mg/dL (ref 6–23)
CO2: 27 mEq/L (ref 19–32)
Calcium: 9.6 mg/dL (ref 8.4–10.5)
Chloride: 104 mEq/L (ref 96–112)
Creatinine, Ser: 1.11 mg/dL (ref 0.40–1.50)
GFR: 69.93 mL/min (ref >60.00)
Glucose, Bld: 82 mg/dL (ref 70–99)
Potassium: 4.1 mEq/L (ref 3.5–5.1)
Sodium: 140 mEq/L (ref 135–145)
Total Bilirubin: 0.3 mg/dL (ref 0.2–1.2)
Total Protein: 7.5 g/dL (ref 6.0–8.3)

## 2019-06-27 LAB — PROTIME-INR
INR: 1.1 ratio — ABNORMAL HIGH (ref 0.8–1.0)
Prothrombin Time: 12.5 s (ref 9.6–13.1)

## 2019-06-27 LAB — BRAIN NATRIURETIC PEPTIDE: Pro B Natriuretic peptide (BNP): 15 pg/mL (ref 0.0–100.0)

## 2019-06-27 NOTE — Patient Instructions (Signed)
Flu Shot CPAP Is working well & you are Very compliant Cleared for knee surgery

## 2019-06-27 NOTE — Assessment & Plan Note (Signed)
Smoking cessation again emphasized 

## 2019-06-27 NOTE — Assessment & Plan Note (Addendum)
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 Advised against medications with sedative side effects Cautioned against driving when sleepy - understanding that sleepiness will vary on a day to day basis

## 2019-06-27 NOTE — Progress Notes (Signed)
   Subjective:    Patient ID: [name], male    DOB: 19-Jul-1971, 48 y.o.   MRN: [id number]  HPI  48 yo smoker with paroxysmal atrial fibrillation  for follow-up of OSA. 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  He obtained a new CPAP machine in2019 And will follow-up today, he is due for a DOT physicals only.  CPAP is working well, denies daytime somnolence or fatigue, no problems with mask or pressure.  No mild dryness. Download was reviewed which shows excellent control of events on 15 cm and good compliance more than 6.5 hours per night.  He has no problems driving.  He does have a large leak on his machine -he admits that he needs to change the lining of his mask.  He continues to smoke about half pack per day.  Wt is unchanged  Significant tests/ events reviewed  NPSG 2007: AHI 76/hr, and cpap optimized to 15cm   Review of Systems neg for any significant sore throat, dysphagia, itching, sneezing, nasal congestion or excess/ purulent secretions, fever, chills, sweats, unintended wt loss, pleuritic or exertional cp, hempoptysis, orthopnea pnd or change in chronic leg swelling. Also denies presyncope, palpitations, heartburn, abdominal pain, nausea, vomiting, diarrhea or change in bowel or urinary habits, dysuria,hematuria, rash, arthralgias, visual complaints, headache, numbness weakness or ataxia.     Objective:   Physical Exam   Gen. Pleasant, obese, in no distress ENT - no lesions, no post nasal drip Neck: No JVD, no thyromegaly, no carotid bruits Lungs: no use of accessory muscles, no dullness to percussion, decreased without rales or rhonchi  Cardiovascular: Rhythm regular, heart sounds  normal, no murmurs or gallops, no peripheral edema Musculoskeletal: No deformities, no cyanosis or clubbing , no tremors        Assessment & Plan:

## 2019-06-29 ENCOUNTER — Telehealth: Payer: Self-pay | Admitting: Family Medicine

## 2019-06-29 NOTE — Telephone Encounter (Signed)
Caryl Pina at [facility] asked for patient to call us and find out about the status of patient's pre-op clearance.

## 2019-06-29 NOTE — Telephone Encounter (Signed)
Forms in Lisa's box to fax. plz notify pt when faxed.

## 2019-07-02 NOTE — Telephone Encounter (Signed)
patient advised and pre op forms and information faxed over.

## 2019-07-05 NOTE — Patient Instructions (Signed)
DUE TO COVID-19 ONLY ONE VISITOR IS ALLOWED TO COME WITH YOU AND STAY IN THE WAITING ROOM ONLY DURING PRE OP AND PROCEDURE DAY OF SURGERY. THE 1 VISITOR MAY VISIT WITH YOU AFTER SURGERY IN YOUR PRIVATE ROOM DURING VISITING HOURS ONLY!  YOU NEED TO HAVE A COVID 19 TEST ON_______ @_______ , THIS TEST MUST BE DONE BEFORE SURGERY, COME  [place] [place] , [id number].  ([place]) ONCE YOUR COVID TEST IS COMPLETED, PLEASE BEGIN THE QUARANTINE INSTRUCTIONS AS OUTLINED IN YOUR HANDOUT.                [name]  07/05/2019   Your procedure is scheduled on: 07-20-19   Report to [facility]  Entrance   Report to  Short stay  at      0530 am  AM     Call this number if you have problems the morning of surgery [phone]    Remember: Do not eat food or drink liquids :After Midnight.    BRUSH YOUR TEETH MORNING OF SURGERY AND RINSE YOUR MOUTH OUT, NO CHEWING GUM CANDY OR MINTS.     Take these medicines the morning of surgery with A SIP OF WATER: Gabapentin and Tylenol if needed  DO NOT TAKE ANY DIABETIC MEDICATIONS DAY OF YOUR SURGERY                               You may not have any metal on your body including hair pins and              piercings  Do not wear jewelry,  lotions, powders or perfumes, deodorant                      Men may shave face and neck.   Do not bring valuables to the hospital. [place].  Contacts, dentures or bridgework may not be worn into surgery.                Please read over the following fact sheets you were given: _____________________________________________________________________           [facility] - Preparing for Surgery Before surgery, you can play an important role.  Because skin is not sterile, your skin needs to be as free of germs as possible.  You can reduce the number of germs on your skin by washing with CHG (chlorahexidine gluconate) soap  before surgery.  CHG is an antiseptic cleaner which kills germs and bonds with the skin to continue killing germs even after washing. Please DO NOT use if you have an allergy to CHG or antibacterial soaps.  If your skin becomes reddened/irritated stop using the CHG and inform your nurse when you arrive at Short Stay. Do not shave (including legs and underarms) for at least 48 hours prior to the first CHG shower.  You may shave your face/neck. Please follow these instructions carefully:  1.  Shower with CHG Soap the night before surgery and the  morning of Surgery.  2.  If you choose to wash your hair, wash your hair first as usual with your  normal  shampoo.  3.  After you shampoo, rinse your hair and body thoroughly to remove the  shampoo.  4.  Use CHG as you would any other liquid soap.  You can apply chg directly  to the skin and wash                       Gently with a scrungie or clean washcloth.  5.  Apply the CHG Soap to your body ONLY FROM THE NECK DOWN.   Do not use on face/ open                           Wound or open sores. Avoid contact with eyes, ears mouth and genitals (private parts).                       Wash face,  Genitals (private parts) with your normal soap.             6.  Wash thoroughly, paying special attention to the area where your surgery  will be performed.  7.  Thoroughly rinse your body with warm water from the neck down.  8.  DO NOT shower/wash with your normal soap after using and rinsing off  the CHG Soap.                9.  Pat yourself dry with a clean towel.            10.  Wear clean pajamas.            11.  Place clean sheets on your bed the night of your first shower and do not  sleep with pets. Day of Surgery : Do not apply any lotions/deodorants the morning of surgery.  Please wear clean clothes to the hospital/surgery center.  FAILURE TO FOLLOW THESE INSTRUCTIONS MAY RESULT IN THE CANCELLATION OF YOUR SURGERY PATIENT  SIGNATURE_________________________________  NURSE SIGNATURE__________________________________  ________________________________________________________________________

## 2019-07-10 ENCOUNTER — Encounter (HOSPITAL_COMMUNITY)
Admission: RE | Admit: 2019-07-10 | Discharge: 2019-07-10 | Disposition: A | Discharge status: Home / Self Care | Payer: 59 | Source: Ambulatory Visit | Attending: Specialist | Admitting: Specialist

## 2019-07-10 ENCOUNTER — Encounter (HOSPITAL_COMMUNITY): Payer: Self-pay

## 2019-07-10 ENCOUNTER — Telehealth: Payer: Self-pay | Admitting: Family Medicine

## 2019-07-10 ENCOUNTER — Other Ambulatory Visit: Payer: Self-pay

## 2019-07-10 DIAGNOSIS — Z01812 Encounter for preprocedural laboratory examination: Secondary | ICD-10-CM | POA: Diagnosis present

## 2019-07-10 LAB — CBC
HCT: 45.7 % (ref 39.0–52.0)
Hemoglobin: 14.6 g/dL (ref 13.0–17.0)
MCH: 27.9 pg (ref 26.0–34.0)
MCHC: 31.9 g/dL (ref 30.0–36.0)
MCV: 87.4 fL (ref 80.0–100.0)
Platelets: 261 10*3/uL (ref 150–400)
RBC: 5.23 MIL/uL (ref 4.22–5.81)
RDW: 13.5 % (ref 11.5–15.5)
WBC: 12.6 10*3/uL — ABNORMAL HIGH (ref 4.0–10.5)
nRBC: 0 % (ref 0.0–0.2)

## 2019-07-10 LAB — BASIC METABOLIC PANEL
Anion gap: 10 (ref 5–15)
BUN: 19 mg/dL (ref 6–20)
CO2: 26 mmol/L (ref 22–32)
Calcium: 9.1 mg/dL (ref 8.9–10.3)
Chloride: 102 mmol/L (ref 98–111)
Creatinine, Ser: 1 mg/dL (ref 0.61–1.24)
GFR calc Af Amer: >60 mL/min (ref >60)
GFR calc non Af Amer: >60 mL/min (ref >60)
Glucose, Bld: 81 mg/dL (ref 70–99)
Potassium: 4.1 mmol/L (ref 3.5–5.1)
Sodium: 138 mmol/L (ref 135–145)

## 2019-07-10 LAB — SURGICAL PCR SCREEN
MRSA, PCR: NEGATIVE
Staphylococcus aureus: NEGATIVE

## 2019-07-10 NOTE — Telephone Encounter (Signed)
Caryl Pina from Dr. Theda Sers office  [phone] fax number  They are in need of his surgical clearance. They have not receive the clearance and they need it before tomorrow.

## 2019-07-10 NOTE — Telephone Encounter (Signed)
Left message on vm for Caryl Pina to call back.  Need to inform her pt's info was faxed on 07/02/19.

## 2019-07-11 NOTE — Progress Notes (Signed)
PCP - Virginia Crews Cardiologist -   Chest x-ray - 06-27-19 epic EKG - 06-26-19 epic Stress Test -  ECHO -  Cardiac Cath -   Sleep Study -  CPAP -   Fasting Blood Sugar -  Checks Blood Sugar _____ times a day  Blood Thinner Instructions: Aspirin Instructions: Last Dose:  Anesthesia review: OSA on cpap  Patient denies shortness of breath, fever, cough and chest pain at PAT appointment  none   Patient verbalized understanding of instructions that were given to them at the PAT appointment. Patient was also instructed that they will need to review over the PAT instructions again at home before surgery.

## 2019-07-11 NOTE — H&P (Signed)
TOTAL KNEE ADMISSION H&P  Patient is being admitted for left total knee arthroplasty.  Subjective:  Chief Complaint:left knee pain.  HPI: Jacob GriffinsSteven R Folino, 48 y.o. male, has a history of pain and functional disability in the left knee due to arthritis and has failed non-surgical conservative treatments for greater than 12 weeks to includeNSAID's and/or analgesics, corticosteriod injections, viscosupplementation injections, flexibility and strengthening excercises and use of assistive devices.  Onset of symptoms was gradual, starting many years ago with gradually worsening course since that time. The patient noted prior procedures on the knee to include  arthroscopy and menisectomy on the left knee(s).  Patient currently rates pain in the left knee(s) at 5 out of 10 with activity. Patient has night pain, worsening of pain with activity and weight bearing, pain that interferes with activities of daily living, pain with passive range of motion and joint swelling.  Patient has evidence of subchondral cysts, subchondral sclerosis, periarticular osteophytes and joint space narrowing by imaging studies. There is no active infection.  Patient Active Problem List   Diagnosis Date Noted  . Family history of premature CAD 06/26/2019  . S/P left knee arthroscopy 08/24/2018  . Osteoarthritis of left knee 07/26/2018  . Kidney stones 01/19/2012  . Preoperative clearance 09/10/2011  . Smoker 07/22/2011  . Left groin pain 04/27/2011  . DEGENERATIVE DISC DISEASE, LUMBOSACRAL SPINE W/RADICULOPATHY 09/22/2010  . GERD 04/09/2010  . OSA on CPAP 03/16/2010  . BACK PAIN, THORACIC REGION 02/24/2010  . LOW BACK PAIN SYNDROME 02/24/2010  . HLD (hyperlipidemia) 01/15/2009  . ATRIAL FIBRILLATION, PAROXYSMAL 01/15/2009   Past Medical History:  Diagnosis Date  . Arnold-Chiari malformation (HCC) 2002   s/p decompression  . 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 Colon polyps 2011   rpt Q5 yrs  . DDD  (degenerative disc disease) SPINE  . History of kidney stones   . HLD (hyperlipidemia)   . Internal hemorrhoids   . OSA on CPAP DR CLANCE   CPAP  . Paroxysmal A-fib (HCC) CARDIOLOGIST-  DR Graciela HusbandsKLEIN-- LAST VISIT 10-22-2010 IN [facility]   controlled with flecainide 300mg  prn  . Seasonal allergies     Past Surgical History:  Procedure Laterality Date  . ADENOIDECTOMY  AGE 48  . ANTERIOR LUMBAR DISKECTOMY / DECOMPRESSION   10-08-2010   L5 - S1, LEFT SIDE ([place])  . ANTERIOR LUMBAR FUSION  07/2014   L4/5, L5/S1 [facility]  . CARDIOVERSION  1996, 2008   x2 in 1996  . COLONOSCOPY  2011   polyp, rec rpt 5 yrs  . CT chest noncontrast  06/2011   f/u L pulm nodule - gone.  Marland Kitchen. HIATAL HERNIA REPAIR  1999  . HIP ARTHROSCOPY  09/23/2011   LEFT HIP [facility] [place]   . KNEE ARTHROSCOPY WITH MEDIAL MENISECTOMY Left 08/24/2018   Procedure: LEFT KNEE ARTHROSCOPY WITH PARTIAL MEDIAL MENISECTOMY, ARTHROSCOPIC INTERNAL FIXATION MEDIAL FEMORAL CONDYLE AND MEDIAL TIBIAL PLATEAU, CHONDROPLASTY;  Surgeon: Eugenia Mcalpineollins, Robert, MD;  [facility] [place];  Service: Orthopedics;  Laterality: Left;  . SUBOCCIPITAL CRANIECTOMY CERVICAL LAMINECTOMY  11-30-2000  DR NUDALMAN   C1 AND PARTIAL C2--   CHIARI  MALFORMATION  . URETEROSCOPY  03/02/2012   Procedure: URETEROSCOPY;  Surgeon: Marcine MatarStephen Dahlstedt, MD;  [facility] [place];  Service: Urology;  Laterality: Left;  stone obtained    No current facility-administered medications for this encounter.    Current Outpatient Medications  Medication Sig Dispense Refill Last  Dose  . acetaminophen (TYLENOL) 325 MG tablet Take 650 mg by mouth every 6 (six) hours as needed (for pain.).     Marland Kitchen celecoxib (CELEBREX) 200 MG capsule Take 200 mg by mouth at bedtime.      . gabapentin (NEURONTIN) 400 MG capsule Take 400 mg by mouth 3 (three) times daily.     . Multiple Vitamin (MULTIVITAMIN WITH MINERALS) TABS tablet Take 1 tablet by mouth every evening.  Centrum 50+     . naproxen sodium (ALEVE) 220 MG tablet Take 440 mg by mouth daily as needed (pain.).      Allergies  Allergen Reactions  . Ibuprofen Swelling    Social History   Tobacco Use  . Smoking status: Current Every Day Smoker    Packs/day: 1.00    Years: 20.00    Pack years: 20.00    Types: Cigarettes  . Smokeless tobacco: Never Used  . Tobacco comment: pack per day 04/12/2018   Substance Use Topics  . Alcohol use: No    Family History  Problem Relation Age of Onset  . Hypertension Mother   . Heart attack Mother 72  . Heart disease Mother   . Coronary artery disease Mother 14  . Coronary artery disease Father 48       6v CABG  . Heart disease Father 107       CABG  . Diabetes Father   . Alcohol abuse Father   . Liver cancer Father 67  . Liver disease Other        Grandmother (P or M not specified)  . Cancer Paternal Uncle        lung, leukemia  . Suicidality Paternal Uncle   . Hemochromatosis Cousin      Review of Systems  Constitutional: Negative.   HENT: Negative.   Eyes: Negative.   Respiratory: Negative.   Cardiovascular: Negative.   Gastrointestinal: Negative.   Genitourinary: Negative.   Musculoskeletal: Positive for back pain and joint pain.  Skin: Negative.   Neurological: Negative.   Endo/Heme/Allergies: Negative.   Psychiatric/Behavioral: Negative.     Objective:  Physical Exam  Constitutional: He is oriented to person, place, and time. He appears well-developed and well-nourished. No distress.  HENT:  Head: Normocephalic and atraumatic.  Eyes: Pupils are equal, round, and reactive to light. EOM are normal.  Neck: Normal range of motion. Neck supple. No JVD present. No thyromegaly present.  Cardiovascular: Normal rate, regular rhythm and normal heart sounds.  No murmur heard. Respiratory: Effort normal and breath sounds normal. No stridor.  GI: Soft. There is no abdominal tenderness. There is no rebound.  Musculoskeletal:      Comments: On exam of his left knee no skin changes noted.  He still has some minor effusion noted.  He is very tender to palpation on the medial lateral joint line.  Crepitus under the patella with extension.  He has no laxity with varus and valgus pressure.  Full flexion, full ex of 5 strength resisted flexion and extension.  Neurovascularly intact left lower extremity.  Lymphadenopathy:    He has no cervical adenopathy.  Neurological: He is alert and oriented to person, place, and time.  Skin: Skin is warm and dry.  Psychiatric: He has a normal mood and affect. His behavior is normal. Judgment and thought content normal.    Vital signs in last 24 hours: Temp:  [98.8 F (37.1 C)] 98.8 F (37.1 C) (10/20 1432) Pulse Rate:  [83] 83 (10/20 1432)  Resp:  [16] 16 (10/20 1432) BP: (145)/(88) 145/88 (10/20 1432) SpO2:  [98 %] 98 % (10/20 1432) Weight:  [126.6 kg] 126.6 kg (10/20 1432)  Labs:   Estimated body mass index is 34.87 kg/m as calculated from the following:   Height as of 07/10/19: 6\' 3"  (1.905 m).   Weight as of 07/10/19: 126.6 kg.   Imaging Review Plain radiographs demonstrate severe degenerative joint disease of the left knee(s). The overall alignment ismild varus. The bone quality appears to be fair for age and reported activity level.      Assessment/Plan:  End stage arthritis, left knee   The patient history, physical examination, clinical judgment of the provider and imaging studies are consistent with end stage degenerative joint disease of the left knee(s) and total knee arthroplasty is deemed medically necessary. The treatment options including medical management, injection therapy arthroscopy and arthroplasty were discussed at length. The risks and benefits of total knee arthroplasty were presented and reviewed. The risks due to aseptic loosening, infection, stiffness, patella tracking problems, thromboembolic complications and other imponderables were discussed.  The patient acknowledged the explanation, agreed to proceed with the plan and consent was signed. Patient is being admitted for inpatient treatment for surgery, pain control, PT, OT, prophylactic antibiotics, VTE prophylaxis, progressive ambulation and ADL's and discharge planning. The patient is planning to be discharged home with home health services

## 2019-07-12 NOTE — Progress Notes (Signed)
Anesthesia Chart Review:   Case: [id number] Date/Time: 07/20/19 0715   Procedure: TOTAL KNEE ARTHROPLASTY (Left Knee) - with adductor canal   Anesthesia type: Spinal   Pre-op diagnosis: Left knee osteoarthritis   [facility]: [facility] ROOM 07 / [facility]   Surgeon: Sydnee Cabal, MD      DISCUSSION:  - Pt is a 48 year old male with hx PAF, OSA. Current smoker.   - By notes, it appears no recurrence of PAF in recent years; not on anticoagulation.    VS: BP (!) 145/88   Pulse 83   Temp 37.1 C (Oral)   Resp 16   Ht 6\' 3"  (1.905 m)   Wt 126.6 kg   SpO2 98%   BMI 34.87 kg/m    PROVIDERS: - PCP is Ria Bush, MD who cleared pt for surgery. Last office visit 06/26/19 - Pulmonologist Kara Mead, MD sees pt for OSA. Last visit 06/27/19 - Used to see cardiologist Virl Axe, MD for PAF; last office visit 07/06/17 with Tommye Standard, PA.  At that visit, CHA2DS2Vasc score was zero    LABS: Labs reviewed: Acceptable for surgery. (all labs ordered are listed, but only abnormal results are displayed)  Labs Reviewed  CBC - Abnormal; Notable for the following components:      Result Value   WBC 12.6 (*)    All other components within normal limits  SURGICAL PCR SCREEN  BASIC METABOLIC PANEL     IMAGES:  CXR 06/27/19: No acute abnormality of the lungs   EKG 06/26/19: NSR. No acute ST/T changes, nonspecific ST changes inferiorly, unchanged from prior   CV: N/A  Past Medical History:  Diagnosis Date  . Arnold-Chiari malformation ([place]) 2002   s/p decompression  . 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 Colon polyps 2011   rpt Q5 yrs  . DDD (degenerative disc disease) SPINE  . History of kidney stones   . HLD (hyperlipidemia)   . Internal hemorrhoids   . OSA on CPAP DR CLANCE   CPAP  . Paroxysmal A-fib ([place]) CARDIOLOGIST-  DR Caryl Comes-- LAST VISIT 10-22-2010 IN [facility]   controlled with flecainide 300mg  prn  . Seasonal allergies     Past Surgical History:   Procedure Laterality Date  . ADENOIDECTOMY  AGE 45  . ANTERIOR LUMBAR DISKECTOMY / DECOMPRESSION   10-08-2010   L5 - S1, LEFT SIDE ([place])  . ANTERIOR LUMBAR FUSION  07/2014   L4/5, L5/S1 [facility]  . [place], 2008   x2 in 1996  . COLONOSCOPY  2011   polyp, rec rpt 5 yrs  . CT chest noncontrast  06/2011   f/u L pulm nodule - gone.  Marland Kitchen HIATAL HERNIA REPAIR  1999  . HIP ARTHROSCOPY  09/23/2011   LEFT HIP [facility]   . KNEE ARTHROSCOPY WITH MEDIAL MENISECTOMY Left 08/24/2018   Procedure: LEFT KNEE ARTHROSCOPY WITH PARTIAL MEDIAL MENISECTOMY, ARTHROSCOPIC INTERNAL FIXATION MEDIAL FEMORAL CONDYLE AND MEDIAL TIBIAL PLATEAU, CHONDROPLASTY;  Surgeon: Sydnee Cabal, MD;  Location: [place];  Service: Orthopedics;  Laterality: Left;  . SUBOCCIPITAL CRANIECTOMY CERVICAL LAMINECTOMY  11-30-2000  DR NUDALMAN   C1 AND PARTIAL C2--   CHIARI  MALFORMATION  . URETEROSCOPY  03/02/2012   Procedure: URETEROSCOPY;  Surgeon: Franchot Gallo, MD;  Location: [facility];  Service: Urology;  Laterality: Left;  stone obtained    MEDICATIONS: . acetaminophen (TYLENOL) 325 MG tablet  . celecoxib (  CELEBREX) 200 MG capsule  . gabapentin (NEURONTIN) 400 MG capsule  . Multiple Vitamin (MULTIVITAMIN WITH MINERALS) TABS tablet  . naproxen sodium (ALEVE) 220 MG tablet   No current facility-administered medications for this encounter.     If no changes, I anticipate pt can proceed with surgery as scheduled.   Rica Mast, FNP-BC [facility]/Anesthesiology Phone: [phone] 07/12/2019 12:22 PM

## 2019-07-12 NOTE — Telephone Encounter (Signed)
Spoke with Caryl Pina asking if she received pt's form/paperwork.  Confirms she found it.

## 2019-07-12 NOTE — Anesthesia Preprocedure Evaluation (Addendum)
Anesthesia Evaluation  Patient identified by MRN, date of birth, ID band Patient awake    Reviewed: Allergy & Precautions, H&P , NPO status , Patient's Chart, lab work & pertinent test results  Airway Mallampati: II   Neck ROM: full    Dental   Pulmonary sleep apnea , Current Smoker,    breath sounds clear to auscultation       Cardiovascular + dysrhythmias Atrial Fibrillation  Rhythm:regular Rate:Normal     Neuro/Psych Arnold-Chiari malformation s/p decompression    GI/Hepatic GERD  ,  Endo/Other  obese  Renal/GU      Musculoskeletal  (+) Arthritis ,   Abdominal   Peds  Hematology   Anesthesia Other Findings   Reproductive/Obstetrics                            Anesthesia Physical Anesthesia Plan  ASA: II  Anesthesia Plan: General   Post-op Pain Management:  Regional for Post-op pain   Induction: Intravenous  PONV Risk Score and Plan: 1 and Midazolam, Ondansetron and Treatment may vary due to age or medical condition  Airway Management Planned: Oral ETT  Additional Equipment:   Intra-op Plan:   Post-operative Plan: Extubation in OR  Informed Consent: I have reviewed the patients History and Physical, chart, labs and discussed the procedure including the risks, benefits and alternatives for the proposed anesthesia with the patient or authorized representative who has indicated his/her understanding and acceptance.       Plan Discussed with: CRNA, Anesthesiologist and Surgeon  Anesthesia Plan Comments: (See APP note by Durel Salts, FNP)      Anesthesia Quick Evaluation

## 2019-07-17 ENCOUNTER — Other Ambulatory Visit (HOSPITAL_COMMUNITY)
Admission: RE | Admit: 2019-07-17 | Discharge: 2019-07-17 | Disposition: A | Discharge status: Home / Self Care | Payer: 59 | Source: Ambulatory Visit | Attending: Specialist | Admitting: Specialist

## 2019-07-17 DIAGNOSIS — Z01812 Encounter for preprocedural laboratory examination: Secondary | ICD-10-CM | POA: Insufficient documentation

## 2019-07-17 DIAGNOSIS — Z20828 Contact with and (suspected) exposure to other viral communicable diseases: Secondary | ICD-10-CM | POA: Insufficient documentation

## 2019-07-18 LAB — NOVEL CORONAVIRUS, NAA (HOSP ORDER, SEND-OUT TO REF LAB; TAT 18-24 HRS): SARS-CoV-2, NAA: NOT DETECTED

## 2019-07-19 MED ORDER — DEXTROSE 5 % IV SOLN
3.0000 g | INTRAVENOUS | Status: AC
  Administered 2019-07-20: 3 g via INTRAVENOUS
  Filled 2019-07-19: qty 3

## 2019-07-20 ENCOUNTER — Ambulatory Visit (HOSPITAL_COMMUNITY): Payer: 59 | Admitting: Certified Registered Nurse Anesthetist

## 2019-07-20 ENCOUNTER — Encounter (HOSPITAL_COMMUNITY)
Admission: RE | Disposition: A | Discharge status: Home Health | Payer: Self-pay | Source: Home / Self Care | Attending: Specialist

## 2019-07-20 ENCOUNTER — Observation Stay (HOSPITAL_COMMUNITY)
Admission: RE | Admit: 2019-07-20 | Discharge: 2019-07-21 | Disposition: A | Discharge status: Home Health | Payer: 59 | Attending: Specialist | Admitting: Specialist

## 2019-07-20 ENCOUNTER — Ambulatory Visit (HOSPITAL_COMMUNITY): Payer: 59 | Admitting: Emergency Medicine

## 2019-07-20 ENCOUNTER — Encounter (HOSPITAL_COMMUNITY): Payer: Self-pay | Admitting: *Deleted

## 2019-07-20 ENCOUNTER — Other Ambulatory Visit: Payer: Self-pay

## 2019-07-20 DIAGNOSIS — E669 Obesity, unspecified: Secondary | ICD-10-CM | POA: Diagnosis not present

## 2019-07-20 DIAGNOSIS — Z981 Arthrodesis status: Secondary | ICD-10-CM | POA: Insufficient documentation

## 2019-07-20 DIAGNOSIS — Z8249 Family history of ischemic heart disease and other diseases of the circulatory system: Secondary | ICD-10-CM | POA: Diagnosis not present

## 2019-07-20 DIAGNOSIS — F17210 Nicotine dependence, cigarettes, uncomplicated: Secondary | ICD-10-CM | POA: Diagnosis not present

## 2019-07-20 DIAGNOSIS — G4730 Sleep apnea, unspecified: Secondary | ICD-10-CM | POA: Insufficient documentation

## 2019-07-20 DIAGNOSIS — M1712 Unilateral primary osteoarthritis, left knee: Principal | ICD-10-CM | POA: Diagnosis present

## 2019-07-20 DIAGNOSIS — Z79899 Other long term (current) drug therapy: Secondary | ICD-10-CM | POA: Diagnosis not present

## 2019-07-20 DIAGNOSIS — Q0700 Arnold-Chiari syndrome without spina bifida or hydrocephalus: Secondary | ICD-10-CM | POA: Insufficient documentation

## 2019-07-20 DIAGNOSIS — I480 Paroxysmal atrial fibrillation: Secondary | ICD-10-CM | POA: Insufficient documentation

## 2019-07-20 DIAGNOSIS — I4891 Unspecified atrial fibrillation: Secondary | ICD-10-CM | POA: Insufficient documentation

## 2019-07-20 DIAGNOSIS — E785 Hyperlipidemia, unspecified: Secondary | ICD-10-CM | POA: Diagnosis not present

## 2019-07-20 DIAGNOSIS — G4733 Obstructive sleep apnea (adult) (pediatric): Secondary | ICD-10-CM | POA: Diagnosis not present

## 2019-07-20 DIAGNOSIS — Z6834 Body mass index (BMI) 34.0-34.9, adult: Secondary | ICD-10-CM | POA: Insufficient documentation

## 2019-07-20 DIAGNOSIS — Z791 Long term (current) use of non-steroidal anti-inflammatories (NSAID): Secondary | ICD-10-CM | POA: Insufficient documentation

## 2019-07-20 DIAGNOSIS — K219 Gastro-esophageal reflux disease without esophagitis: Secondary | ICD-10-CM | POA: Insufficient documentation

## 2019-07-20 HISTORY — PX: TOTAL KNEE ARTHROPLASTY: SHX125

## 2019-07-20 LAB — CBC
HCT: 43.1 % (ref 39.0–52.0)
Hemoglobin: 13.6 g/dL (ref 13.0–17.0)
MCH: 27.6 pg (ref 26.0–34.0)
MCHC: 31.6 g/dL (ref 30.0–36.0)
MCV: 87.4 fL (ref 80.0–100.0)
Platelets: 239 10*3/uL (ref 150–400)
RBC: 4.93 MIL/uL (ref 4.22–5.81)
RDW: 13.5 % (ref 11.5–15.5)
WBC: 12.3 10*3/uL — ABNORMAL HIGH (ref 4.0–10.5)
nRBC: 0 % (ref 0.0–0.2)

## 2019-07-20 LAB — CREATININE, SERUM
Creatinine, Ser: 1.08 mg/dL (ref 0.61–1.24)
GFR calc Af Amer: >60 mL/min (ref >60)
GFR calc non Af Amer: >60 mL/min (ref >60)

## 2019-07-20 LAB — HIV ANTIBODY (ROUTINE TESTING W REFLEX): HIV Screen 4th Generation wRfx: NONREACTIVE

## 2019-07-20 SURGERY — ARTHROPLASTY, KNEE, TOTAL
Anesthesia: General | Site: Knee | Laterality: Left

## 2019-07-20 MED ORDER — PROPOFOL 500 MG/50ML IV EMUL
INTRAVENOUS | Status: AC
  Filled 2019-07-20: qty 50

## 2019-07-20 MED ORDER — OXYCODONE HCL ER 10 MG PO T12A
10.0000 mg | EXTENDED_RELEASE_TABLET | Freq: Two times a day (BID) | ORAL | Status: DC
  Administered 2019-07-20 – 2019-07-21 (×2): 10 mg via ORAL
  Filled 2019-07-20 (×2): qty 1

## 2019-07-20 MED ORDER — ONDANSETRON HCL 4 MG/2ML IJ SOLN
INTRAMUSCULAR | Status: DC | PRN
  Administered 2019-07-20: 4 mg via INTRAVENOUS

## 2019-07-20 MED ORDER — SUGAMMADEX SODIUM 500 MG/5ML IV SOLN
INTRAVENOUS | Status: AC
  Filled 2019-07-20: qty 5

## 2019-07-20 MED ORDER — TRAMADOL HCL 50 MG PO TABS
50.0000 mg | ORAL_TABLET | Freq: Four times a day (QID) | ORAL | Status: DC
  Administered 2019-07-20 – 2019-07-21 (×4): 50 mg via ORAL
  Filled 2019-07-20 (×4): qty 1

## 2019-07-20 MED ORDER — OXYCODONE HCL 5 MG/5ML PO SOLN: 5.0000 mg | Freq: Once | ORAL | Status: DC | PRN

## 2019-07-20 MED ORDER — ROCURONIUM BROMIDE 10 MG/ML (PF) SYRINGE
PREFILLED_SYRINGE | INTRAVENOUS | Status: AC
  Filled 2019-07-20: qty 10

## 2019-07-20 MED ORDER — CELECOXIB 200 MG PO CAPS
200.0000 mg | ORAL_CAPSULE | Freq: Two times a day (BID) | ORAL | Status: DC
  Administered 2019-07-20 – 2019-07-21 (×2): 200 mg via ORAL
  Filled 2019-07-20 (×2): qty 1

## 2019-07-20 MED ORDER — PROPOFOL 10 MG/ML IV BOLUS
INTRAVENOUS | Status: AC
  Filled 2019-07-20: qty 20

## 2019-07-20 MED ORDER — BUPIVACAINE LIPOSOME 1.3 % IJ SUSP
Freq: Once | INTRAMUSCULAR | Status: DC
  Filled 2019-07-20: qty 20

## 2019-07-20 MED ORDER — ONDANSETRON HCL 4 MG/2ML IJ SOLN
INTRAMUSCULAR | Status: AC
  Filled 2019-07-20: qty 2

## 2019-07-20 MED ORDER — SUGAMMADEX SODIUM 200 MG/2ML IV SOLN
INTRAVENOUS | Status: DC | PRN
  Administered 2019-07-20: 250 mg via INTRAVENOUS

## 2019-07-20 MED ORDER — ACETAMINOPHEN 500 MG PO TABS
1000.0000 mg | ORAL_TABLET | Freq: Four times a day (QID) | ORAL | Status: AC
  Administered 2019-07-20 – 2019-07-21 (×4): 1000 mg via ORAL
  Filled 2019-07-20 (×4): qty 2

## 2019-07-20 MED ORDER — OXYCODONE HCL 5 MG PO TABS: 5.0000 mg | ORAL_TABLET | Freq: Once | ORAL | Status: DC | PRN

## 2019-07-20 MED ORDER — DEXMEDETOMIDINE HCL IN NACL 200 MCG/50ML IV SOLN
INTRAVENOUS | Status: AC
  Filled 2019-07-20: qty 100

## 2019-07-20 MED ORDER — HYDROMORPHONE HCL 1 MG/ML IJ SOLN
0.5000 mg | INTRAMUSCULAR | Status: DC | PRN
  Administered 2019-07-20: 1 mg via INTRAVENOUS
  Filled 2019-07-20: qty 1

## 2019-07-20 MED ORDER — FENTANYL CITRATE (PF) 100 MCG/2ML IJ SOLN
INTRAMUSCULAR | Status: AC
  Filled 2019-07-20: qty 2

## 2019-07-20 MED ORDER — SENNOSIDES-DOCUSATE SODIUM 8.6-50 MG PO TABS: 1.0000 | ORAL_TABLET | Freq: Every evening | ORAL | Status: DC | PRN

## 2019-07-20 MED ORDER — FENTANYL CITRATE (PF) 100 MCG/2ML IJ SOLN
INTRAMUSCULAR | Status: DC | PRN
  Administered 2019-07-20 (×2): 50 ug via INTRAVENOUS
  Administered 2019-07-20: 25 ug via INTRAVENOUS
  Administered 2019-07-20: 50 ug via INTRAVENOUS
  Administered 2019-07-20: 100 ug via INTRAVENOUS
  Administered 2019-07-20 (×2): 50 ug via INTRAVENOUS
  Administered 2019-07-20: 25 ug via INTRAVENOUS

## 2019-07-20 MED ORDER — BISACODYL 5 MG PO TBEC: 5.0000 mg | DELAYED_RELEASE_TABLET | Freq: Every day | ORAL | Status: DC | PRN

## 2019-07-20 MED ORDER — SODIUM CHLORIDE 0.9 % IR SOLN
Status: DC | PRN
  Administered 2019-07-20: 1000 mL

## 2019-07-20 MED ORDER — METHOCARBAMOL 500 MG IVPB - SIMPLE MED
500.0000 mg | Freq: Four times a day (QID) | INTRAVENOUS | Status: DC | PRN
  Administered 2019-07-20: 500 mg via INTRAVENOUS
  Filled 2019-07-20: qty 50

## 2019-07-20 MED ORDER — METHOCARBAMOL 500 MG IVPB - SIMPLE MED
INTRAVENOUS | Status: AC
  Filled 2019-07-20: qty 50

## 2019-07-20 MED ORDER — ACETAMINOPHEN 325 MG PO TABS: 325.0000 mg | ORAL_TABLET | Freq: Four times a day (QID) | ORAL | Status: DC | PRN

## 2019-07-20 MED ORDER — MAGNESIUM CITRATE PO SOLN: 1.0000 | Freq: Once | ORAL | Status: DC | PRN

## 2019-07-20 MED ORDER — FENTANYL CITRATE (PF) 100 MCG/2ML IJ SOLN
25.0000 ug | INTRAMUSCULAR | Status: DC | PRN
  Administered 2019-07-20: 50 ug via INTRAVENOUS
  Administered 2019-07-20 (×2): 25 ug via INTRAVENOUS

## 2019-07-20 MED ORDER — BUPIVACAINE LIPOSOME 1.3 % IJ SUSP
20.0000 mL | Freq: Once | INTRAMUSCULAR | Status: AC
  Filled 2019-07-20: qty 20

## 2019-07-20 MED ORDER — OXYCODONE HCL 5 MG PO TABS
10.0000 mg | ORAL_TABLET | ORAL | Status: DC | PRN
  Administered 2019-07-20 (×2): 15 mg via ORAL
  Filled 2019-07-20 (×2): qty 3

## 2019-07-20 MED ORDER — 0.9 % SODIUM CHLORIDE (POUR BTL) OPTIME
TOPICAL | Status: DC | PRN
  Administered 2019-07-20: 1000 mL

## 2019-07-20 MED ORDER — FENTANYL CITRATE (PF) 100 MCG/2ML IJ SOLN
INTRAMUSCULAR | Status: AC
  Filled 2019-07-20: qty 4

## 2019-07-20 MED ORDER — DEXAMETHASONE SODIUM PHOSPHATE 10 MG/ML IJ SOLN
INTRAMUSCULAR | Status: DC | PRN
  Administered 2019-07-20: 10 mg via INTRAVENOUS

## 2019-07-20 MED ORDER — DIPHENHYDRAMINE HCL 12.5 MG/5ML PO ELIX: 12.5000 mg | ORAL_SOLUTION | ORAL | Status: DC | PRN

## 2019-07-20 MED ORDER — PROPOFOL 10 MG/ML IV BOLUS
INTRAVENOUS | Status: DC | PRN
  Administered 2019-07-20: 200 mg via INTRAVENOUS

## 2019-07-20 MED ORDER — OXYCODONE HCL 5 MG PO TABS
5.0000 mg | ORAL_TABLET | ORAL | Status: DC | PRN
  Administered 2019-07-21: 10 mg via ORAL
  Administered 2019-07-21: 5 mg via ORAL
  Administered 2019-07-21: 10 mg via ORAL
  Filled 2019-07-20 (×2): qty 2
  Filled 2019-07-20: qty 1

## 2019-07-20 MED ORDER — POVIDONE-IODINE 10 % EX SWAB
2.0000 "application " | Freq: Once | CUTANEOUS | Status: AC
  Administered 2019-07-20: 2 via TOPICAL

## 2019-07-20 MED ORDER — SODIUM CHLORIDE (PF) 0.9 % IJ SOLN
INTRAMUSCULAR | Status: AC
  Filled 2019-07-20: qty 50

## 2019-07-20 MED ORDER — MIDAZOLAM HCL 2 MG/2ML IJ SOLN
INTRAMUSCULAR | Status: AC
  Filled 2019-07-20: qty 2

## 2019-07-20 MED ORDER — ONDANSETRON HCL 4 MG PO TABS: 4.0000 mg | ORAL_TABLET | Freq: Every day | ORAL | 1 refills | Status: AC | PRN

## 2019-07-20 MED ORDER — ENOXAPARIN SODIUM 40 MG/0.4ML ~~LOC~~ SOLN
40.0000 mg | SUBCUTANEOUS | Status: DC
  Administered 2019-07-21: 40 mg via SUBCUTANEOUS
  Filled 2019-07-20: qty 0.4

## 2019-07-20 MED ORDER — STERILE WATER FOR IRRIGATION IR SOLN
Status: DC | PRN
  Administered 2019-07-20: 2000 mL

## 2019-07-20 MED ORDER — SODIUM CHLORIDE 0.9 % IV SOLN
INTRAVENOUS | Status: DC
  Administered 2019-07-20: 13:00:00 via INTRAVENOUS

## 2019-07-20 MED ORDER — DEXMEDETOMIDINE HCL IN NACL 200 MCG/50ML IV SOLN
INTRAVENOUS | Status: DC | PRN
  Administered 2019-07-20: 4 ug via INTRAVENOUS
  Administered 2019-07-20 (×4): 8 ug via INTRAVENOUS
  Administered 2019-07-20: 4 ug via INTRAVENOUS

## 2019-07-20 MED ORDER — DEXAMETHASONE SODIUM PHOSPHATE 10 MG/ML IJ SOLN
INTRAMUSCULAR | Status: AC
  Filled 2019-07-20: qty 1

## 2019-07-20 MED ORDER — OXYCODONE HCL 10 MG PO TABS: 10.0000 mg | ORAL_TABLET | ORAL | 0 refills | Status: AC | PRN

## 2019-07-20 MED ORDER — SODIUM CHLORIDE (PF) 0.9 % IJ SOLN
INTRAMUSCULAR | Status: AC
  Filled 2019-07-20: qty 10

## 2019-07-20 MED ORDER — METHOCARBAMOL 500 MG PO TABS: 500.0000 mg | ORAL_TABLET | Freq: Four times a day (QID) | ORAL | 0 refills | Status: DC | PRN

## 2019-07-20 MED ORDER — LIDOCAINE 2% (20 MG/ML) 5 ML SYRINGE
INTRAMUSCULAR | Status: AC
  Filled 2019-07-20: qty 5

## 2019-07-20 MED ORDER — CHLORHEXIDINE GLUCONATE 4 % EX LIQD: 60.0000 mL | Freq: Once | CUTANEOUS | Status: DC

## 2019-07-20 MED ORDER — LACTATED RINGERS IV SOLN
INTRAVENOUS | Status: DC
  Administered 2019-07-20 (×2): via INTRAVENOUS

## 2019-07-20 MED ORDER — CEFAZOLIN SODIUM-DEXTROSE 2-4 GM/100ML-% IV SOLN
2.0000 g | Freq: Three times a day (TID) | INTRAVENOUS | Status: AC
  Administered 2019-07-20 – 2019-07-21 (×3): 2 g via INTRAVENOUS
  Filled 2019-07-20 (×3): qty 100

## 2019-07-20 MED ORDER — ONDANSETRON HCL 4 MG/2ML IJ SOLN: 4.0000 mg | Freq: Four times a day (QID) | INTRAMUSCULAR | Status: DC | PRN

## 2019-07-20 MED ORDER — MIDAZOLAM HCL 2 MG/2ML IJ SOLN
INTRAMUSCULAR | Status: DC | PRN
  Administered 2019-07-20: 2 mg via INTRAVENOUS

## 2019-07-20 MED ORDER — SODIUM CHLORIDE 0.9 % IV SOLN
INTRAVENOUS | Status: DC | PRN
  Administered 2019-07-20: 20 mL

## 2019-07-20 MED ORDER — TRANEXAMIC ACID-NACL 1000-0.7 MG/100ML-% IV SOLN
1000.0000 mg | INTRAVENOUS | Status: AC
  Administered 2019-07-20: 1000 mg via INTRAVENOUS
  Filled 2019-07-20: qty 100

## 2019-07-20 MED ORDER — LIDOCAINE 2% (20 MG/ML) 5 ML SYRINGE
INTRAMUSCULAR | Status: DC | PRN
  Administered 2019-07-20: 60 mg via INTRAVENOUS

## 2019-07-20 MED ORDER — ROPIVACAINE HCL 5 MG/ML IJ SOLN
INTRAMUSCULAR | Status: DC | PRN
  Administered 2019-07-20: 25 mL via PERINEURAL

## 2019-07-20 MED ORDER — METHOCARBAMOL 500 MG PO TABS
500.0000 mg | ORAL_TABLET | Freq: Four times a day (QID) | ORAL | Status: DC | PRN
  Administered 2019-07-20 – 2019-07-21 (×2): 500 mg via ORAL
  Filled 2019-07-20 (×2): qty 1

## 2019-07-20 MED ORDER — GABAPENTIN 300 MG PO CAPS
300.0000 mg | ORAL_CAPSULE | Freq: Three times a day (TID) | ORAL | Status: DC
  Administered 2019-07-20 – 2019-07-21 (×3): 300 mg via ORAL
  Filled 2019-07-20 (×3): qty 1

## 2019-07-20 MED ORDER — SODIUM CHLORIDE (PF) 0.9 % IJ SOLN
INTRAMUSCULAR | Status: DC | PRN
  Administered 2019-07-20: 60 mL

## 2019-07-20 MED ORDER — ROCURONIUM BROMIDE 50 MG/5ML IV SOSY
PREFILLED_SYRINGE | INTRAVENOUS | Status: DC | PRN
  Administered 2019-07-20: 50 mg via INTRAVENOUS
  Administered 2019-07-20: 20 mg via INTRAVENOUS

## 2019-07-20 SURGICAL SUPPLY — 68 items
ADH SKN CLS APL DERMABOND .7 (GAUZE/BANDAGES/DRESSINGS) ×1
ATTUNE MED DOME PAT 38 KNEE (Knees) ×1 IMPLANT
ATTUNE PS FEM LT CEM SZ9 KNEE (Femur) ×1 IMPLANT
BAG DECANTER FOR FLEXI CONT (MISCELLANEOUS) IMPLANT
BAG SPEC THK2 15X12 ZIP CLS (MISCELLANEOUS) ×1
BAG ZIPLOCK 12X15 (MISCELLANEOUS) ×3 IMPLANT
BASE TIBIAL ATTUNE KNEE SZ9 (Knees) IMPLANT
BLADE SAG 18X100X1.27 (BLADE) ×2 IMPLANT
BLADE SAW SGTL 11.0X1.19X90.0M (BLADE) ×2 IMPLANT
BNDG ELASTIC 4X5.8 VLCR STR LF (GAUZE/BANDAGES/DRESSINGS) ×2 IMPLANT
BNDG ELASTIC 6X5.8 VLCR STR LF (GAUZE/BANDAGES/DRESSINGS) ×2 IMPLANT
BOWL SMART MIX CTS (DISPOSABLE) ×2 IMPLANT
BSPLAT TIB 9 CMNT ROT PLAT STR (Knees) ×1 IMPLANT
CEMENT HV SMART SET (Cement) ×2 IMPLANT
COVER SURGICAL LIGHT HANDLE (MISCELLANEOUS) ×2 IMPLANT
COVER WAND RF STERILE (DRAPES) ×1 IMPLANT
CUFF TOURN SGL QUICK 34 (TOURNIQUET CUFF) ×2
CUFF TRNQT CYL 34X4.125X (TOURNIQUET CUFF) ×1 IMPLANT
DECANTER SPIKE VIAL GLASS SM (MISCELLANEOUS) ×3 IMPLANT
DERMABOND ADVANCED (GAUZE/BANDAGES/DRESSINGS) ×1
DERMABOND ADVANCED .7 DNX12 (GAUZE/BANDAGES/DRESSINGS) ×1 IMPLANT
DRAPE U-SHAPE 47X51 STRL (DRAPES) ×2 IMPLANT
DRESSING AQUACEL AG SP 3.5X10 (GAUZE/BANDAGES/DRESSINGS) IMPLANT
DRSG AQUACEL AG ADV 3.5X10 (GAUZE/BANDAGES/DRESSINGS) ×2 IMPLANT
DRSG AQUACEL AG SP 3.5X10 (GAUZE/BANDAGES/DRESSINGS) ×2
DRSG TEGADERM 4X4.75 (GAUZE/BANDAGES/DRESSINGS) ×2 IMPLANT
DURAPREP 26ML APPLICATOR (WOUND CARE) ×5 IMPLANT
ELECT REM PT RETURN 15FT ADLT (MISCELLANEOUS) ×2 IMPLANT
EVACUATOR 1/8 PVC DRAIN (DRAIN) ×2 IMPLANT
GAUZE SPONGE 2X2 8PLY STRL LF (GAUZE/BANDAGES/DRESSINGS) ×1 IMPLANT
GLOVE BIOGEL PI IND STRL 7.5 (GLOVE) ×1 IMPLANT
GLOVE BIOGEL PI IND STRL 8 (GLOVE) ×1 IMPLANT
GLOVE BIOGEL PI INDICATOR 7.5 (GLOVE) ×1
GLOVE BIOGEL PI INDICATOR 8 (GLOVE) ×1
GLOVE ECLIPSE 8.0 STRL XLNG CF (GLOVE) ×2 IMPLANT
GLOVE SURG ORTHO 9.0 STRL STRW (GLOVE) ×2 IMPLANT
GLOVE SURG SS PI 7.0 STRL IVOR (GLOVE) ×2 IMPLANT
GOWN STRL REUS W/TWL XL LVL3 (GOWN DISPOSABLE) ×4 IMPLANT
HANDPIECE INTERPULSE COAX TIP (DISPOSABLE) ×2
HOLDER FOLEY CATH W/STRAP (MISCELLANEOUS) IMPLANT
INSERT TIBIAL ATTUNE SZ9 5MM (Insert) ×1 IMPLANT
KIT TURNOVER KIT A (KITS) IMPLANT
NS IRRIG 1000ML POUR BTL (IV SOLUTION) ×2 IMPLANT
PACK TOTAL KNEE CUSTOM (KITS) ×2 IMPLANT
PIN DRILL FIX HALF THREAD (BIT) ×1 IMPLANT
PIN STEINMAN FIXATION KNEE (PIN) ×1 IMPLANT
PROTECTOR NERVE ULNAR (MISCELLANEOUS) ×2 IMPLANT
SET HNDPC FAN SPRY TIP SCT (DISPOSABLE) ×1 IMPLANT
SET PAD KNEE POSITIONER (MISCELLANEOUS) ×2 IMPLANT
SPONGE GAUZE 2X2 STER 10/PKG (GAUZE/BANDAGES/DRESSINGS) ×1
SPONGE LAP 18X18 RF (DISPOSABLE) ×1 IMPLANT
SPONGE SURGIFOAM ABS GEL 100 (HEMOSTASIS) ×2 IMPLANT
STOCKINETTE 6  STRL (DRAPES) ×1
STOCKINETTE 6 STRL (DRAPES) ×1 IMPLANT
SUT BONE WAX W31G (SUTURE) IMPLANT
SUT MNCRL AB 3-0 PS2 18 (SUTURE) ×2 IMPLANT
SUT VIC AB 1 CT1 27 (SUTURE) ×6
SUT VIC AB 1 CT1 27XBRD ANTBC (SUTURE) ×3 IMPLANT
SUT VIC AB 2-0 CT1 27 (SUTURE) ×6
SUT VIC AB 2-0 CT1 TAPERPNT 27 (SUTURE) ×2 IMPLANT
SUT VLOC 180 0 24IN GS25 (SUTURE) ×2 IMPLANT
SYR 50ML LL SCALE MARK (SYRINGE) ×1 IMPLANT
TAPE STRIPS DRAPE STRL (GAUZE/BANDAGES/DRESSINGS) ×2 IMPLANT
TIBIAL BASE ATTUNE KNEE SZ9 (Knees) ×2 IMPLANT
TIBIAL BASE ROT PLAT SZ 9 KNEE (Miscellaneous) ×1 IMPLANT
WATER STERILE IRR 1000ML POUR (IV SOLUTION) ×4 IMPLANT
WRAP KNEE MAXI GEL POST OP (GAUZE/BANDAGES/DRESSINGS) ×2 IMPLANT
YANKAUER SUCT BULB TIP 10FT TU (MISCELLANEOUS) ×2 IMPLANT

## 2019-07-20 NOTE — Anesthesia Procedure Notes (Signed)
Procedure Name: Intubation Date/Time: 07/20/2019 7:52 AM Performed by: Genelle Bal, CRNA Pre-anesthesia Checklist: Patient identified, Emergency Drugs available, Suction available and Patient being monitored Patient Re-evaluated:Patient Re-evaluated prior to induction Oxygen Delivery Method: Circle system utilized Preoxygenation: Pre-oxygenation with 100% oxygen Induction Type: IV induction Ventilation: Mask ventilation without difficulty Laryngoscope Size: Miller and 2 Grade View: Grade III Tube type: Oral Tube size: 7.5 mm Number of attempts: 1 Airway Equipment and Method: Stylet and Oral airway Placement Confirmation: ETT inserted through vocal cords under direct vision,  positive ETCO2 and breath sounds checked- equal and bilateral Secured at: 23 cm Tube secured with: Tape Dental Injury: Teeth and Oropharynx as per pre-operative assessment

## 2019-07-20 NOTE — Evaluation (Signed)
Physical Therapy Evaluation Patient Details Name: [name] MRN: [id number] DOB: July 08, 1971 Today's Date: 07/20/2019   History of Present Illness  Patient is 48 y.o. male s/p Lt TKA on 07/20/19 with PMH significant for HLD, A-fib.  Clinical Impression  [name] is a 48 y.o. male POD 0 s/p Lt TKA. Patient reports independnece with mobility at baseline with occasional use of cane for pain relief. Patient is now limited by functional impairments (see PT problem list below) and requires min assist for transfers and gait with RW. Patient was able to ambulate ~60 feet with RW and min assist to maintain safe use of RW. Patient instructed in exercises to facilitate ROM and circulation. Patient will benefit from continued skilled PT interventions to address impairments and progress towards PLOF. Acute PT will follow to progress mobility and stair training in preparation for safe discharge home.     Follow Up Recommendations Follow surgeon's recommendation for DC plan and follow-up therapies    Equipment Recommendations  Rolling walker with 5" wheels;3in1 (PT)    Recommendations for Other Services       Precautions / Restrictions Precautions Precautions: Fall Restrictions Weight Bearing Restrictions: No      Mobility  Bed Mobility Overal bed mobility: Needs Assistance Bed Mobility: Supine to Sit     Supine to sit: HOB elevated;Min guard     General bed mobility comments: cues for sequencing with bed rails, no physical assist required  Transfers Overall transfer level: Needs assistance Equipment used: Rolling walker (2 wheeled) Transfers: Sit to/from Stand Sit to Stand: Min assist;From elevated surface         General transfer comment: cues for safe hand placement and technique with RW, assist required to initiate power up and steady throughout rise  Ambulation/Gait Ambulation/Gait assistance: Min assist Gait Distance (Feet): 60 Feet Assistive device: Rolling walker  (2 wheeled) Gait Pattern/deviations: Step-to pattern;Decreased stride length;Decreased stance time - left;Decreased step length - right;Antalgic Gait velocity: decreased   General Gait Details: cues for safe hand placement on RW throughout gait and to maintain safe proximity as pt has tendency to step too close to RW, assist requried to maintain safe use of RW however no overt LOB noted  Stairs            Wheelchair Mobility    Modified Rankin (Stroke Patients Only)       Balance Overall balance assessment: Needs assistance Sitting-balance support: No upper extremity supported;Feet supported Sitting balance-Leahy Scale: Good     Standing balance support: During functional activity;Bilateral upper extremity supported Standing balance-Leahy Scale: Poor              Pertinent Vitals/Pain Pain Assessment: 0-10 Pain Score: 5  Pain Location: Lt knee Pain Descriptors / Indicators: Aching;Sore Pain Intervention(s): Limited activity within patient's tolerance;Monitored during session;Repositioned    Home Living Family/patient expects to be discharged to:: Private residence Living Arrangements: Spouse/significant other(one daughter is going to be moving back home) Available Help at Discharge: Family;Available 24 hours/day;Available PRN/intermittently(pt's mom will be at home for how long is needed; pt's wife is still working) Type of Home: House Home Access: Stairs to enter Entrance Stairs-Rails: Doctor, general practice of Steps: 5 than another step up at threshold Home Layout: One level Home Equipment: Cane - single point      Prior Function Level of Independence: Independent         Comments: pt works for [employer] (walks around [employer], works around [employer])  Hand Dominance   Dominant Hand: Right    Extremity/Trunk Assessment   Upper Extremity Assessment Upper Extremity Assessment: Overall WFL for tasks assessed    Lower  Extremity Assessment Lower Extremity Assessment: Overall WFL for tasks assessed;LLE deficits/detail LLE Deficits / Details: pt limited by pain to complete SLR, no significant lag noted, 4-/5 for MMT LLE Sensation: WNL LLE Coordination: WNL    Cervical / Trunk Assessment Cervical / Trunk Assessment: Normal  Communication   Communication: No difficulties  Cognition Arousal/Alertness: Awake/alert Behavior During Therapy: WFL for tasks assessed/performed Overall Cognitive Status: Within Functional Limits for tasks assessed             General Comments      Exercises Total Joint Exercises Ankle Circles/Pumps: AROM;Seated;15 reps;Both Quad Sets: AROM;5 reps;Seated;Left Heel Slides: AAROM;5 reps;Left;Seated   Assessment/Plan    PT Assessment Patient needs continued PT services  PT Problem List Decreased strength;Decreased balance;Decreased mobility;Decreased range of motion;Decreased activity tolerance;Decreased knowledge of use of DME       PT Treatment Interventions DME instruction;Functional mobility training;Therapeutic activities;Gait training;Stair training;Therapeutic exercise;Balance training;Patient/family education;Modalities    PT Goals (Current goals can be found in the Care Plan section)  Acute Rehab PT Goals Patient Stated Goal: to be independent again, be able to get up and down from the ground PT Goal Formulation: With patient Time For Goal Achievement: 07/27/19 Potential to Achieve Goals: Good    Frequency 7X/week    AM-PAC PT "6 Clicks" Mobility  Outcome Measure Help needed turning from your back to your side while in a flat bed without using bedrails?: A Little Help needed moving from lying on your back to sitting on the side of a flat bed without using bedrails?: A Little Help needed moving to and from a bed to a chair (including a wheelchair)?: A Little Help needed standing up from a chair using your arms (e.g., wheelchair or bedside chair)?: A  Little Help needed to walk in hospital room?: A Little Help needed climbing 3-5 steps with a railing? : A Little 6 Click Score: 18    End of Session Equipment Utilized During Treatment: Gait belt Activity Tolerance: Patient tolerated treatment well Patient left: with call bell/phone within reach;in chair;with family/visitor present;with chair alarm set Nurse Communication: Mobility status PT Visit Diagnosis: Muscle weakness (generalized) (M62.81);Difficulty in walking, not elsewhere classified (R26.2)    Time: 1093-2355 PT Time Calculation (min) (ACUTE ONLY): 36 min   Charges:   PT Evaluation $PT Eval Low Complexity: 1 Low PT Treatments $Gait Training: 8-22 mins       Kipp Brood, PT, DPT Physical Therapist with [facility]  07/20/2019 4:50 PM

## 2019-07-20 NOTE — Interval H&P Note (Signed)
History and Physical Interval Note:  07/20/2019 7:39 AM  [name]  has presented today for surgery, with the diagnosis of Left knee osteoarthritis.  The various methods of treatment have been discussed with the patient and family. After consideration of risks, benefits and other options for treatment, the patient has consented to  Procedure(s) with comments: TOTAL KNEE ARTHROPLASTY (Left) - with adductor canal as a surgical intervention.  The patient's history has been reviewed, patient examined, no change in status, stable for surgery.  I have reviewed the patient's chart and labs.  Questions were answered to the patient's satisfaction.     Markhi Kleckner ANDREW

## 2019-07-20 NOTE — Op Note (Signed)
DATE OF SURGERY:  07/20/2019  TIME: 10:13 AM  PATIENT NAME:  [name]    AGE: 48 y.o.   PRE-OPERATIVE DIAGNOSIS:  Left knee osteoarthritis  POST-OPERATIVE DIAGNOSIS:  Left knee osteoarthritis  PROCEDURE:  Procedure(s): TOTAL KNEE ARTHROPLASTY  SURGEON:  Deldrick Linch ANDREW  ASSISTANT:  Leeanne Haus, PA-C, present and scrubbed throughout the case, critical for assistance with exposure, retraction, instrumentation, and closure.  OPERATIVE IMPLANTS: Depuy PFC Attune Rotating Platform.  Femur size 9, Tibia size 9, Patella size 38 3-peg oval button, with a 5 mm polyethylene insert.   PREOPERATIVE INDICATIONS:   [name] is a 48 y.o. year old male with end stage bone on bone arthritis of the knee who failed conservative treatment and elected for Total Knee Arthroplasty.   The risks, benefits, and alternatives were discussed at length including but not limited to the risks of infection, bleeding, nerve injury, stiffness, blood clots, the need for revision surgery, cardiopulmonary complications, among others, and they were willing to proceed.  OPERATIVE DESCRIPTION:  The patient was brought to the operative room and placed in a supine position.  Spinal anesthesia was administered.  IV antibiotics were given.  The lower extremity was prepped and draped in the usual sterile fashion.  Time out was performed.  The leg was elevated and exsanguinated and the tourniquet was inflated.  Anterior quadriceps tendon splitting approach was performed.  The patella was retracted and osteophytes were removed.  The anterior horn of the medial and lateral meniscus was removed and cruciate ligaments resected.   The distal femur was opened with the drill and the intramedullary distal femoral cutting jig was utilized, set at 5 degrees resecting 10 mm off the distal femur.  Care was taken to protect the collateral ligaments.  The distal femoral sizing jig was applied, taking care to avoid  notching.  Then the 4-in-1 cutting jig was applied and the anterior and posterior femur was cut, along with the chamfer cuts.    Then the extramedullary tibial cutting jig was utilized making the appropriate cut using the anterior tibial crest as a reference building in appropriate posterior slope.  Care was taken during the cut to protect the medial and collateral ligaments.  The proximal tibia was removed along with the posterior horns of the menisci.   The posterior medial femoral osteophytes and posterior lateral femoral osteophytes were removed.    The flexion gap was then measured and was symmetric with the extension gap, measured at 5.  I completed the distal femoral preparation using the appropriate jig to prepare the box.  The patella was then measured, and cut with the saw.    The proximal tibia sized and prepared accordingly with the reamer and the punch, and then all components were trialed with the trial insert.  The knee was found to have excellent balance and full motion.    The above named components were then cemented into place and all excess cement was removed.  The trial polyethylene component was in place during cementation, and then was exchanged for the real polyethylene component.    The knee was easily taken through a range of motion and the patella tracked well and the knee irrigated copiously and the parapatellar and subcutaneous tissue closed with vicryl, and monocryl with steri strips for the skin.  The arthrotomy was closed at 90 of flexion. The wounds were dressed with sterile gauze and the tourniquet released and the patient was awakened and returned to the PACU in  stable and satisfactory condition.  There were no complications.  Total tourniquet time was 85 minutes.

## 2019-07-20 NOTE — Anesthesia Procedure Notes (Signed)
Anesthesia Regional Block: Adductor canal block   Pre-Anesthetic Checklist: ,, timeout performed, Correct Patient, Correct Site, Correct Laterality, Correct Procedure, Correct Position, site marked, Risks and benefits discussed,  Surgical consent,  Pre-op evaluation,  At surgeon's request and post-op pain management  Laterality: Left  Prep: chloraprep       Needles:  Injection technique: Single-shot  Needle Type: Echogenic Needle     Needle Length: 9cm  Needle Gauge: 21     Additional Needles:   Narrative:  Start time: 07/20/2019 6:58 AM End time: 07/20/2019 7:05 AM Injection made incrementally with aspirations every 5 mL.  Performed by: Personally  Anesthesiologist: Albertha Ghee, MD  Additional Notes: Pt tolerated the procedure well.

## 2019-07-20 NOTE — Transfer of Care (Signed)
Immediate Anesthesia Transfer of Care Note  Patient: [name]  Procedure(s) Performed: TOTAL KNEE ARTHROPLASTY (Left Knee)  Patient Location: PACU  Anesthesia Type:GA combined with regional for post-op pain  Level of Consciousness: awake, alert  and oriented  Airway & Oxygen Therapy: Patient Spontanous Breathing and Patient connected to face mask oxygen  Post-op Assessment: Report given to RN and Post -op Vital signs reviewed and stable  Post vital signs: Reviewed and stable  Last Vitals:  Vitals Value Taken Time  BP 162/105 07/20/19 1038  Temp    Pulse 96 07/20/19 1039  Resp 12 07/20/19 1039  SpO2 98 % 07/20/19 1039  Vitals shown include unvalidated device data.  Last Pain:  Vitals:   07/20/19 0610  TempSrc: Oral  PainSc:       Patients Stated Pain Goal: 2 (48/01/65 5374)  Complications: No apparent anesthesia complications

## 2019-07-21 DIAGNOSIS — M1712 Unilateral primary osteoarthritis, left knee: Secondary | ICD-10-CM | POA: Diagnosis not present

## 2019-07-21 NOTE — Progress Notes (Signed)
Physical Therapy Treatment Patient Details Name: [name] MRN: [id number] DOB: [birth year] Today's Date: 07/21/2019    History of Present Illness Patient is 48 y.o. male s/p Lt TKA on 07/20/19 with PMH significant for HLD, A-fib.    PT Comments    POD # 1 am session Assisted OOB.  General bed mobility comments: demonstrated and instructed how to use a belt self assist LE.  Assisted with amb.  General Gait Details: 25% VC's on proper walker to self distance and safety with turns.  Practiced stairs. Stair Management: One rail Right;With crutches  Number of Stairs: 2 General stair comments: 50% VC's on proper tech and sequencing  Then returned to room to perform some TE's following HEP handout.  Instructed on proper tech, freq as well as use of ICE.   Pt will need another PT session to practice stairs with spouse and complete HEP.    Follow Up Recommendations  Follow surgeon's recommendation for DC plan and follow-up therapies     Equipment Recommendations  Rolling walker with 5" wheels;3in1 (PT)    Recommendations for Other Services       Precautions / Restrictions Precautions Precautions: Fall Restrictions Weight Bearing Restrictions: No    Mobility  Bed Mobility Overal bed mobility: Needs Assistance Bed Mobility: Supine to Sit     Supine to sit: Min guard;Supervision     General bed mobility comments: demonstrated and instructed how to use a belt self assist LE  Transfers Overall transfer level: Needs assistance Equipment used: Rolling walker (2 wheeled) Transfers: Sit to/from Stand Sit to Stand: Min assist;From elevated surface         General transfer comment: 25% VC's on safety with turns and LE advancement prior to sit  Ambulation/Gait Ambulation/Gait assistance: Supervision;Min guard Gait Distance (Feet): 125 Feet Assistive device: Rolling walker (2 wheeled) Gait Pattern/deviations: Step-to pattern;Decreased stride length;Decreased stance time -  left;Decreased step length - right;Antalgic Gait velocity: decreased   General Gait Details: 25% VC's on proper walker to self distance and safety with turns   Stairs Stairs: Yes Stairs assistance: Min guard Stair Management: One rail Right;With crutches Number of Stairs: 2 General stair comments: 50% VC's on proper tech and sequencing   Wheelchair Mobility    Modified Rankin (Stroke Patients Only)       Balance                                            Cognition Arousal/Alertness: Awake/alert Behavior During Therapy: WFL for tasks assessed/performed Overall Cognitive Status: Within Functional Limits for tasks assessed                                        Exercises   Total Knee Replacement TE's 10 reps B LE ankle pumps 10 reps towel squeezes 10 reps knee presses 10 reps heel slides  10 reps SAQ's 10 reps SLR's 10 reps ABD Followed by ICE     General Comments        Pertinent Vitals/Pain Pain Assessment: No/denies pain Pain Descriptors / Indicators: Aching;Sore Pain Intervention(s): Monitored during session;Premedicated before session;Repositioned;Ice applied    Home Living                      Prior Function  PT Goals (current goals can now be found in the care plan section) Progress towards PT goals: Progressing toward goals    Frequency    7X/week      PT Plan Current plan remains appropriate    Co-evaluation              AM-PAC PT "6 Clicks" Mobility   Outcome Measure  Help needed turning from your back to your side while in a flat bed without using bedrails?: A Little Help needed moving from lying on your back to sitting on the side of a flat bed without using bedrails?: A Little Help needed moving to and from a bed to a chair (including a wheelchair)?: A Little Help needed standing up from a chair using your arms (e.g., wheelchair or bedside chair)?: A Little Help needed  to walk in hospital room?: A Little Help needed climbing 3-5 steps with a railing? : A Little 6 Click Score: 18    End of Session Equipment Utilized During Treatment: Gait belt Activity Tolerance: Patient tolerated treatment well Patient left: with call bell/phone within reach;in chair;with family/visitor present;with chair alarm set Nurse Communication: Mobility status PT Visit Diagnosis: Muscle weakness (generalized) (M62.81);Difficulty in walking, not elsewhere classified (R26.2)     Time: 9628-3662 PT Time Calculation (min) (ACUTE ONLY): 28 min  Charges:  $Gait Training: 8-22 mins $Therapeutic Activity: 8-22 mins                     Felecia Shelling  PTA Acute  Rehabilitation Services Pager      [phone] Office      [phone]

## 2019-07-21 NOTE — Plan of Care (Signed)
  Problem: Health Behavior/Discharge Planning: Goal: Ability to manage health-related needs will improve Outcome: Progressing   Problem: Clinical Measurements: Goal: Ability to maintain clinical measurements within normal limits will improve Outcome: Progressing   Problem: Clinical Measurements: Goal: Will remain free from infection Outcome: Progressing   Problem: Clinical Measurements: Goal: Diagnostic test results will improve Outcome: Progressing   Problem: Clinical Measurements: Goal: Cardiovascular complication will be avoided Outcome: Progressing   Problem: Clinical Measurements: Goal: Respiratory complications will improve Outcome: Progressing   Problem: Activity: Goal: Risk for activity intolerance will decrease Outcome: Progressing

## 2019-07-21 NOTE — TOC Initial Note (Signed)
Transition of Care [facility]) - Initial/Assessment Note    Patient Details  Name: [name] MRN: [id number] Date of Birth: March 04, 1971  Transition of Care (TOC) CM/SW Contact:    Joaquin Courts, RN Phone Number: 07/21/2019, 12:07 PM  Clinical Narrative:     CM spoke with patient at bedside. Patient set up with [facility] for [place]. [employer] to deliver rolling walker and 3-in-1 to bedside for home use.               Expected Discharge Plan: [place] Barriers to Discharge: No Barriers Identified   Patient Goals and CMS Choice Patient states their goals for this hospitalization and ongoing recovery are:: to go home today CMS Medicare.gov Compare Post Acute Care list provided to:: Patient Choice offered to / list presented to : Patient  Expected Discharge Plan and Services Expected Discharge Plan: [place]   Discharge Planning Services: CM Consult Post Acute Care Choice: [place] arrangements for the past 2 months: Single Family Home Expected Discharge Date: 07/21/19               DME Arranged: 3-N-1, Walker rolling DME Agency: [employer] Date DME Agency Contacted: 07/21/19 Time DME Agency Contacted: 1206 Representative spoke with at DME Agency: [place]: PT [place]: [facility] (formerly [employer]) Date [place]: 07/21/19 Time [employer]: 1206 Representative spoke with at [place]: Alwyn Ren  Prior Living Arrangements/Services Living arrangements for the past 2 months: Day with:: Spouse Patient language and need for interpreter reviewed:: Yes Do you feel safe going back to the place where you live?: Yes      Need for Family Participation in Patient Care: Yes (Comment) Care giver support system in place?: Yes (comment)   Criminal Activity/Legal Involvement Pertinent to Current Situation/Hospitalization: No - Comment as needed  Activities of Daily  Living Home Assistive Devices/Equipment: Eyeglasses, CPAP, Dentures (specify type) ADL Screening (condition at time of admission) Patient's cognitive ability adequate to safely complete daily activities?: Yes Is the patient deaf or have difficulty hearing?: No Does the patient have difficulty seeing, even when wearing glasses/contacts?: No Does the patient have difficulty concentrating, remembering, or making decisions?: No Patient able to express need for assistance with ADLs?: Yes Does the patient have difficulty dressing or bathing?: No Independently performs ADLs?: Yes (appropriate for developmental age) Does the patient have difficulty walking or climbing stairs?: No Weakness of Legs: Left Weakness of Arms/Hands: None  Permission Sought/Granted   Permission granted to share information with : Yes, Verbal Permission Granted     Permission granted to share info w AGENCY: [facility]        Emotional Assessment Appearance:: Appears stated age Attitude/Demeanor/Rapport: Engaged Affect (typically observed): Accepting Orientation: : Oriented to Place, Oriented to  Time, Oriented to Situation, Oriented to Self   Psych Involvement: No (comment)  Admission diagnosis:  Left knee osteoarthritis Patient Active Problem List   Diagnosis Date Noted  . Family history of premature CAD 06/26/2019  . S/P left knee arthroscopy 08/24/2018  . Osteoarthritis of left knee 07/26/2018  . Kidney stones 01/19/2012  . Preoperative clearance 09/10/2011  . Smoker 07/22/2011  . Left groin pain 04/27/2011  . DEGENERATIVE DISC DISEASE, LUMBOSACRAL SPINE W/RADICULOPATHY 09/22/2010  . GERD 04/09/2010  . OSA on CPAP 03/16/2010  . BACK PAIN, THORACIC REGION 02/24/2010  . LOW BACK PAIN SYNDROME 02/24/2010  . HLD (hyperlipidemia) 01/15/2009  .  ATRIAL FIBRILLATION, PAROXYSMAL 01/15/2009   PCP:  Eustaquio Boyden, MD Pharmacy:   [facility] [address] [place] - [address] [facility]  [address] [place] [address] Phone: [phone] Fax: [phone]     Social Determinants of Health (SDOH) Interventions    Readmission Risk Interventions No flowsheet data found.

## 2019-07-21 NOTE — Progress Notes (Signed)
Physical Therapy Treatment Patient Details Name: [name] MRN: [id number] DOB: 05-Dec-1970 Today's Date: 07/21/2019    History of Present Illness Patient is 48 y.o. male s/p Lt TKA on 07/20/19 with PMH significant for HLD, A-fib.    PT Comments    POD # 1 pm session.   Spouse present during session.  Assisted with amb and stairs.  General Gait Details: 25% VC's on proper walker to self distance and safety with turns.  Then returned to room to perform some TE's following HEP handout.  Instructed on proper tech, freq as well as use of ICE.   Addressed all mobility questions, discussed appropriate activity, educated on use of ICE.  Pt ready for D/C to home.   Follow Up Recommendations  Follow surgeon's recommendation for DC plan and follow-up therapies     Equipment Recommendations  Rolling walker with 5" wheels;3in1 (PT)    Recommendations for Other Services       Precautions / Restrictions Precautions Precautions: Fall Restrictions Weight Bearing Restrictions: No    Mobility  Bed Mobility Overal bed mobility: Needs Assistance Bed Mobility: Sit to Supine     Supine to sit: Min guard;Supervision Sit to supine: Min guard;Supervision   General bed mobility comments: demonstrated and instructed how to use a belt self assist LE  Transfers Overall transfer level: Needs assistance Equipment used: Rolling walker (2 wheeled) Transfers: Sit to/from Stand Sit to Stand: Min assist;From elevated surface         General transfer comment: 25% VC's on safety with turns and LE advancement prior to sit  Ambulation/Gait Ambulation/Gait assistance: Supervision;Min guard Gait Distance (Feet): 55 Feet Assistive device: Rolling walker (2 wheeled) Gait Pattern/deviations: Step-to pattern;Decreased stride length;Decreased stance time - left;Decreased step length - right;Antalgic Gait velocity: decreased   General Gait Details: 25% VC's on proper walker to self distance and safety  with turns   Stairs Stairs: Yes Stairs assistance: Min guard Stair Management: One rail Right;With crutches Number of Stairs: 2 General stair comments: 50% VC's on proper tech and sequencing with spouse present for "hands on" instruction   Wheelchair Mobility    Modified Rankin (Stroke Patients Only)       Balance                                            Cognition Arousal/Alertness: Awake/alert Behavior During Therapy: WFL for tasks assessed/performed Overall Cognitive Status: Within Functional Limits for tasks assessed                                        Exercises   Total Knee Replacement TE's 10 reps B LE ankle pumps 10 reps towel squeezes 10 reps knee presses 10 reps heel slides  10 reps SAQ's 10 reps SLR's 10 reps ABD Followed by ICE     General Comments        Pertinent Vitals/Pain Pain Assessment: No/denies pain Pain Descriptors / Indicators: Aching;Sore Pain Intervention(s): Monitored during session;Premedicated before session;Repositioned;Ice applied    Home Living                      Prior Function            PT Goals (current goals can now be found in the care  plan section) Progress towards PT goals: Progressing toward goals    Frequency    7X/week      PT Plan Current plan remains appropriate    Co-evaluation              AM-PAC PT "6 Clicks" Mobility   Outcome Measure  Help needed turning from your back to your side while in a flat bed without using bedrails?: A Little Help needed moving from lying on your back to sitting on the side of a flat bed without using bedrails?: A Little Help needed moving to and from a bed to a chair (including a wheelchair)?: A Little Help needed standing up from a chair using your arms (e.g., wheelchair or bedside chair)?: A Little Help needed to walk in hospital room?: A Little Help needed climbing 3-5 steps with a railing? : A Little 6  Click Score: 18    End of Session Equipment Utilized During Treatment: Gait belt Activity Tolerance: Patient tolerated treatment well Patient left: with call bell/phone within reach;in chair;with family/visitor present;with chair alarm set Nurse Communication: Mobility status PT Visit Diagnosis: Muscle weakness (generalized) (M62.81);Difficulty in walking, not elsewhere classified (R26.2)     Time: 7829-5621 PT Time Calculation (min) (ACUTE ONLY): 25 min  Charges:  $Gait Training: 8-22 mins $Therapeutic Exercise: 8-22 mins                     Felecia Shelling  PTA Acute  Rehabilitation Services Pager      [phone] Office      [phone]

## 2019-07-21 NOTE — Progress Notes (Signed)
Subjective: 1 Day Post-Op Procedure(s) (LRB): TOTAL KNEE ARTHROPLASTY (Left) Patient reports pain as moderate.   No events over night Tolerating PO w/o N/V +void +flatus +ambulation Denies CP, calf pain, SOB, dizziness, sweats/chills. States that he would like to go home after step training today.  Objective: Vital signs in last 24 hours: Temp:  [97.9 F (36.6 C)-99.1 F (37.3 C)] 98.1 F (36.7 C) (10/31 0441) Pulse Rate:  [73-105] 73 (10/31 0441) Resp:  [9-29] 18 (10/31 0441) BP: (126-162)/(77-105) 126/77 (10/31 0441) SpO2:  [94 %-100 %] 96 % (10/31 0441)  Intake/Output from previous day: 10/30 0701 - 10/31 0700 In: 1812.8 [P.O.:120; I.V.:1441.4; IV Piggyback:251.4] Out: 1655 [Urine:1500; Drains:55; Blood:100] Intake/Output this shift: No intake/output data recorded.  Recent Labs    07/20/19 1129  HGB 13.6   Recent Labs    07/20/19 1129  WBC 12.3*  RBC 4.93  HCT 43.1  PLT 239   Recent Labs    07/20/19 1129  CREATININE 1.08   No results for input(s): LABPT, INR in the last 72 hours.  Neurologically intact ABD soft Neurovascular intact Sensation intact distally Intact pulses distally Dorsiflexion/Plantar flexion intact Incision: dressing C/D/I No cellulitis present Compartment soft Drain with serosanguinous fluid pulled. Some blood flowed out of drain wound. Pressure was applied. Drainage was stopped with minimal effort. Dressing was applied. ACE wrap was re-applied. Patient instructed to continue using the ace wrap for pressure through PT.    Assessment/Plan: 1 Day Post-Op Procedure(s) (LRB): TOTAL KNEE ARTHROPLASTY (Left) Advance diet Up with therapy DVT PPx: ASA 325 BID, patient reminded.  Encourage IS Plan to D/C home after PT.   Yvonne Kendall Ward 07/21/2019, 8:51 AM

## 2019-07-21 NOTE — Progress Notes (Signed)
    Home health agencies that serve [phone].        [place] Quality of Patient Care Rating Patient Survey Summary Rating  [facility] [phone] 3 out of 5 stars 5 out of [place] [phone] 4  out of 5 stars 4 out of [place] [phone] 4 out of 5 stars 4 out of [place] [phone] 4 out of 5 stars 4 out of 5 stars  [facility] [place] [phone] 3  out of 5 stars 4 out of [place] [phone] out of 5 stars 4 out of [place] [phone] 3  out of 5 stars 5 out of [place] [phone] 4  out of 5 stars 4 out of [place] number Footnote as displayed on [place]  1 This agency provides services under a federal waiver program to non-traditional, chronic long term population.  2 This agency provides services to a special needs population.  3 Not Available.  4 The number of patient episodes for this measure is too small to report.  5 This measure currently does not have data or provider has been certified/recertified for less than 6 months.  6 The national average for this measure is not provided because of state-to-state differences in data collection.  7 Medicare is not displaying rates for this measure for any home health agency, because of an issue with the data.  8 There were problems with the data and they are being corrected.  9 Zero, or very few, patients met the survey's rules for inclusion. The scores shown, if any, reflect a very small number of surveys and may not accurately tell how an agency is doing.  10 Survey results are based on less than 12 months of data.  11 Fewer than 70 patients completed the survey. Use the scores  shown, if any, with caution as the number of surveys may be too low to accurately tell how an agency is doing.  12 No survey results are available for this period.  13 Data suppressed by CMS for one or more quarters.

## 2019-07-23 ENCOUNTER — Encounter (HOSPITAL_COMMUNITY): Payer: Self-pay | Admitting: Specialist

## 2019-07-23 NOTE — Anesthesia Postprocedure Evaluation (Signed)
Anesthesia Post Note  Patient: [name]  Procedure(s) Performed: TOTAL KNEE ARTHROPLASTY (Left Knee)     Patient location during evaluation: PACU Anesthesia Type: General and Regional Level of consciousness: awake and alert Pain management: pain level controlled Vital Signs Assessment: post-procedure vital signs reviewed and stable Respiratory status: spontaneous breathing, nonlabored ventilation, respiratory function stable and patient connected to nasal cannula oxygen Cardiovascular status: blood pressure returned to baseline and stable Postop Assessment: no apparent nausea or vomiting Anesthetic complications: no    Last Vitals:  Vitals:   07/21/19 0045 07/21/19 0441  BP: 136/77 126/77  Pulse: 81 73  Resp: 18 18  Temp: 36.8 C 36.7 C  SpO2: 97% 96%    Last Pain:  Vitals:   07/21/19 1400  TempSrc:   PainSc: [place]

## 2019-07-25 NOTE — Discharge Summary (Signed)
Physician Discharge Summary  Patient ID: [name] MRN: [id number] DOB/AGE: Feb 12, 1971 48 y.o.  Admit date: 07/20/2019 Discharge date: 07/25/2019  Admission Diagnoses: Left knee Osteoarthritis  Discharge Diagnoses:  Active Problems:   Osteoarthritis of left knee   Discharged Condition: good  Hospital Course: patient was admitted to the hospital day surgery.  Patient had a left total knee arthroplasty.  He tolerated surgery well.  He woke up from surgery with no complications.  He was sent to recovery in stable condition.  He was taken up to the  PACU in stable condition with set up to the floor in stable condition with no issues.  On that same day spinal wore off and he was able to get up with therapy.  Postoperative day one he was able to get up with therapy without any issues.  Overall the course of his stay was pretty uneventful.  He was sent home  after PT evaluation.  Consults: None  Significant Diagnostic Studies: none  Treatments: IV hydration, antibiotics: Ancef, analgesia: acetaminophen and oxycodone, robaxin and anticoagulation: Lovenox  Discharge Exam: Blood pressure 126/77, pulse 73, temperature 98.1 F (36.7 C), resp. rate 18, height 6\' 3"  (1.905 m), weight 126.6 kg, SpO2 96 %. General appearance: alert, cooperative, appears stated age and no distress Extremities: extremities normal, atraumatic, no cyanosis or edema and dressings clean dry and intact.   Disposition:   Discharge Instructions    Call MD / Call 911   Complete by: As directed    If you experience chest pain or shortness of breath, CALL 911 and be transported to the hospital emergency room.  If you develope a fever above 101 F, pus (white drainage) or increased drainage or redness at the wound, or calf pain, call your surgeon's office.   Constipation Prevention   Complete by: As directed    Drink plenty of fluids.  Prune juice may be helpful.  You may use a stool softener, such as Colace (over the  counter) 100 mg twice a day.  Use MiraLax (over the counter) for constipation as needed.   Diet - low sodium heart healthy   Complete by: As directed    Discharge instructions   Complete by: As directed    Please keep bandages on until follow up appointment When showering please wrap bandages in Kling wrap and tape top and bottom.  Please take Aspirin 325 mg tabs twice daily for 6 weeks Follow up in office in 2 weeks   Increase activity slowly as tolerated   Complete by: As directed    TED hose   Complete by: As directed    Use stockings (TED hose) for 2 weeks on bilateral leg(s).  You may remove them at night for sleeping.     Allergies as of 07/21/2019      Reactions   Ibuprofen Swelling      Medication List    TAKE these medications   acetaminophen 325 MG tablet Commonly known as: TYLENOL Take 650 mg by mouth every 6 (six) hours as needed (for pain.).   CeleBREX 200 MG capsule Generic drug: celecoxib Take 200 mg by mouth at bedtime.   gabapentin 400 MG capsule Commonly known as: NEURONTIN Take 400 mg by mouth 3 (three) times daily.   methocarbamol 500 MG tablet Commonly known as: ROBAXIN Take 1 tablet (500 mg total) by mouth every 6 (six) hours as needed for muscle spasms.   multivitamin with minerals Tabs tablet Take 1 tablet by mouth every  evening. Centrum 50+   naproxen sodium 220 MG tablet Commonly known as: ALEVE Take 440 mg by mouth daily as needed (pain.).   ondansetron 4 MG tablet Commonly known as: Zofran Take 1 tablet (4 mg total) by mouth daily as needed for nausea or vomiting.   Oxycodone HCl 10 MG Tabs Take 1 tablet (10 mg total) by mouth every 4 (four) hours as needed for up to 7 days for severe pain (pain score 7-10).      Follow-up Information    [facility] Follow up.   Specialty: Home Health Services Why: agency will provide home health physical therapy. agency will call you to schedule first visit Contact information: [address] [place] [address] [phone]           Signed: Cherie Dark 07/25/2019, 1:57 PM

## 2019-11-13 DIAGNOSIS — M5030 Other cervical disc degeneration, unspecified cervical region: Secondary | ICD-10-CM | POA: Insufficient documentation

## 2019-12-12 ENCOUNTER — Other Ambulatory Visit: Payer: Self-pay

## 2019-12-12 ENCOUNTER — Ambulatory Visit: Payer: 59 | Admitting: *Deleted

## 2019-12-12 VITALS — Temp 97.3°F | Ht 75.0 in | Wt 255.0 lb

## 2019-12-12 DIAGNOSIS — Z86010 Personal history of colonic polyps: Secondary | ICD-10-CM

## 2019-12-12 DIAGNOSIS — Z01818 Encounter for other preprocedural examination: Secondary | ICD-10-CM

## 2019-12-12 MED ORDER — SUPREP BOWEL PREP KIT 17.5-3.13-1.6 GM/177ML PO SOLN: ORAL | 0 refills | Status: DC

## 2019-12-12 NOTE — Progress Notes (Signed)
Pt is aware that care partner will wait in the car during procedure; if they feel like they will be too hot or cold to wait in the car; they may wait in the 4 th floor lobby. Patient is aware to bring only one care partner. We want them to wear a mask (we do not have any that we can provide them), practice social distancing, and we will check their temperatures when they get here.  I did remind the patient that their care partner needs to stay in the parking lot the entire time and have a cell phone available, we will call them when the pt is ready for discharge. Patient will wear mask into building.  covid test 12-24-19 at 850  No instances of atrial fib in over 3 years   No trouble with anesthesia, difficulty with intubation or hx/fam hx of malignant hyperthermia per pt   No egg or soy allergy  No home oxygen use   No medications for weight loss taken  emmi information given  Suprep coupon given and code put into RX

## 2019-12-20 HISTORY — PX: COLONOSCOPY: SHX174

## 2019-12-24 ENCOUNTER — Ambulatory Visit (INDEPENDENT_AMBULATORY_CARE_PROVIDER_SITE_OTHER): Payer: 59

## 2019-12-24 ENCOUNTER — Other Ambulatory Visit: Payer: Self-pay | Admitting: Internal Medicine

## 2019-12-24 DIAGNOSIS — Z1159 Encounter for screening for other viral diseases: Secondary | ICD-10-CM

## 2019-12-24 LAB — SARS CORONAVIRUS 2 (TAT 6-24 HRS): SARS Coronavirus 2: NEGATIVE

## 2019-12-25 ENCOUNTER — Encounter: Payer: Self-pay | Admitting: Internal Medicine

## 2019-12-26 ENCOUNTER — Encounter: Payer: Self-pay | Admitting: Internal Medicine

## 2019-12-26 ENCOUNTER — Other Ambulatory Visit: Payer: Self-pay

## 2019-12-26 ENCOUNTER — Ambulatory Visit (AMBULATORY_SURGERY_CENTER): Payer: 59 | Admitting: Internal Medicine

## 2019-12-26 VITALS — BP 101/74 | HR 68 | Temp 96.8°F | Resp 15 | Ht 75.0 in | Wt 255.0 lb

## 2019-12-26 DIAGNOSIS — Z86010 Personal history of colonic polyps: Secondary | ICD-10-CM

## 2019-12-26 MED ORDER — SODIUM CHLORIDE 0.9 % IV SOLN: 500.0000 mL | INTRAVENOUS | Status: DC

## 2019-12-26 NOTE — Progress Notes (Signed)
Report to PACU, RN, vss, BBS= Clear.  

## 2019-12-26 NOTE — Progress Notes (Signed)
KA- VS JB- Temp  Pt's states no medical or surgical changes since previsit or office visit.

## 2019-12-26 NOTE — Op Note (Signed)
[facility] Patient Name: [name] Procedure Date: 12/26/2019 8:13 AM MRN: [id number] Endoscopist: Docia Chuck. Henrene Pastor , MD Age: 49 Referring MD:  Date of Birth: 12/08/1970 Gender: Male Account #: [id number] Procedure:                Colonoscopy Indications:              High risk colon cancer surveillance: Personal                            history of non-advanced adenoma. Previous exam 2011. Medicines:                Monitored Anesthesia Care Procedure:                Pre-Anesthesia Assessment:                           - Prior to the procedure, a History and Physical                            was performed, and patient medications and                            allergies were reviewed. The patient's tolerance of                            previous anesthesia was also reviewed. The risks                            and benefits of the procedure and the sedation                            options and risks were discussed with the patient.                            All questions were answered, and informed consent                            was obtained. Prior Anticoagulants: The patient has                            taken no previous anticoagulant or antiplatelet                            agents. ASA Grade Assessment: II - A patient with                            mild systemic disease. After reviewing the risks                            and benefits, the patient was deemed in                            satisfactory condition to undergo the procedure.  After obtaining informed consent, the colonoscope                            was passed under direct vision. Throughout the                            procedure, the patient's blood pressure, pulse, and                            oxygen saturations were monitored continuously. The                            Colonoscope was introduced through the anus and                            advanced to the the  cecum, identified by                            appendiceal orifice and ileocecal valve. The                            ileocecal valve, appendiceal orifice, and rectum                            were photographed. The quality of the bowel                            preparation was excellent. The colonoscopy was                            performed without difficulty. The patient tolerated                            the procedure well. The bowel preparation used was                            SUPREP via split dose instruction. Scope In: 8:22:26 AM Scope Out: 8:31:30 AM Scope Withdrawal Time: 0 hours 7 minutes 4 seconds  Total Procedure Duration: 0 hours 9 minutes 4 seconds  Findings:                 Internal hemorrhoids were found during                            retroflexion. The hemorrhoids were moderate.                           The exam was otherwise without abnormality on                            direct and retroflexion views. Complications:            No immediate complications. Estimated blood loss:  None. Estimated Blood Loss:     Estimated blood loss: none. Impression:               - Internal hemorrhoids.                           - The examination was otherwise normal on direct                            and retroflexion views.                           - No specimens collected. Recommendation:           - Repeat colonoscopy in 10 years for surveillance.                           - Patient has a contact number available for                            emergencies. The signs and symptoms of potential                            delayed complications were discussed with the                            patient. Return to normal activities tomorrow.                            Written discharge instructions were provided to the                            patient.                           - Resume previous diet.                           - Continue  present medications. Wilhemina Bonito. Marina Goodell, MD 12/26/2019 8:35:40 AM This report has been signed electronically.

## 2019-12-26 NOTE — Patient Instructions (Signed)
Handout provided on Hemorrhoids.   YOU HAD AN ENDOSCOPIC PROCEDURE TODAY AT THE [facility]:   Refer to the procedure report that was given to you for any specific questions about what was found during the examination.  If the procedure report does not answer your questions, please call your gastroenterologist to clarify.  If you requested that your care partner not be given the details of your procedure findings, then the procedure report has been included in a sealed envelope for you to review at your convenience later.  YOU SHOULD EXPECT: Some feelings of bloating in the abdomen. Passage of more gas than usual.  Walking can help get rid of the air that was put into your GI tract during the procedure and reduce the bloating. If you had a lower endoscopy (such as a colonoscopy or flexible sigmoidoscopy) you may notice spotting of blood in your stool or on the toilet paper. If you underwent a bowel prep for your procedure, you may not have a normal bowel movement for a few days.  Please Note:  You might notice some irritation and congestion in your nose or some drainage.  This is from the oxygen used during your procedure.  There is no need for concern and it should clear up in a day or so.  SYMPTOMS TO REPORT IMMEDIATELY:   Following lower endoscopy (colonoscopy or flexible sigmoidoscopy):  Excessive amounts of blood in the stool  Significant tenderness or worsening of abdominal pains  Swelling of the abdomen that is new, acute  Fever of 100F or higher   For urgent or emergent issues, a gastroenterologist can be reached at any hour by calling [phone]. Do not use MyChart messaging for urgent concerns.    DIET:  We do recommend a small meal at first, but then you may proceed to your regular diet.  Drink plenty of fluids but you should avoid alcoholic beverages for 24 hours.  ACTIVITY:  You should plan to take it easy for the rest of today and you should NOT DRIVE or use  heavy machinery until tomorrow (because of the sedation medicines used during the test).    FOLLOW UP: Our staff will call the number listed on your records 48-72 hours following your procedure to check on you and address any questions or concerns that you may have regarding the information given to you following your procedure. If we do not reach you, we will leave a message.  We will attempt to reach you two times.  During this call, we will ask if you have developed any symptoms of COVID 19. If you develop any symptoms (ie: fever, flu-like symptoms, shortness of breath, cough etc.) before then, please call [phone].  If you test positive for Covid 19 in the 2 weeks post procedure, please call and report this information to [place].    If any biopsies were taken you will be contacted by phone or by letter within the next 1-3 weeks.  Please call us at [phone] if you have not heard about the biopsies in 3 weeks.    SIGNATURES/CONFIDENTIALITY: You and/or your care partner have signed paperwork which will be entered into your electronic medical record.  These signatures attest to the fact that that the information above on your After Visit Summary has been reviewed and is understood.  Full responsibility of the confidentiality of this discharge information lies with you and/or your care-partner.

## 2019-12-28 ENCOUNTER — Telehealth: Payer: Self-pay | Admitting: *Deleted

## 2019-12-28 NOTE — Telephone Encounter (Signed)
  Follow up Call-  Call back number 12/26/2019  Post procedure Call Back phone  # #[phone]  Permission to leave phone message No  Some recent data might be hidden     Patient questions:  Do you have a fever, pain , or abdominal swelling? No. Pain Score  0 *  1. Have you tolerated food without any problems? Yes.  1Have you developed a fever since your procedure? no  2.   Have you had an respiratory symptoms (SOB or cough) since your procedure? no  3.   Have you tested positive for COVID 19 since your procedure no  4.   Have you had any family members/close contacts diagnosed with the COVID 19 since your procedure?  no   2. If yes to any of these questions please route to Laverna Peace, RN and Charlett Lango, [facility] you developed a fever since your procedureno  2.   Have you had an respiratory symptoms (SOB or cough) since your procedure? no  3.   Have you tested positive for COVID 19 since your procedure no  4.   Have you had any family members/close contacts diagnosed with the COVID 19 since your procedure?  no   If yes to any of these questions please route to Laverna Peace, RN and Charlett Lango, RN  Have you been able to return to your normal activities? Yes.    Do you have any questions about your discharge instructions: Diet   No. Medications  No. Follow up visit  No.  Do you have questions or concerns about your Care? No.  Actions: * If pain score is 4 or above: No action needed, pain <4.

## 2020-06-17 ENCOUNTER — Telehealth: Payer: Self-pay | Admitting: Pulmonary Disease

## 2020-06-17 NOTE — Telephone Encounter (Signed)
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 Pt requested to have this placed in the mail for him. Verified mailing address that we have on file to make sure this was correct and have placed this in the mail for pt. Nothing further needed.

## 2020-09-20 HISTORY — PX: CARPAL TUNNEL RELEASE: SHX101

## 2020-09-20 HISTORY — PX: ULNAR NERVE TRANSPOSITION: SHX2595

## 2020-11-09 ENCOUNTER — Encounter: Payer: Self-pay | Admitting: Family Medicine

## 2021-01-22 DIAGNOSIS — M5412 Radiculopathy, cervical region: Secondary | ICD-10-CM | POA: Insufficient documentation

## 2021-01-27 DIAGNOSIS — M5416 Radiculopathy, lumbar region: Secondary | ICD-10-CM | POA: Insufficient documentation

## 2021-02-18 ENCOUNTER — Telehealth: Payer: Self-pay | Admitting: Internal Medicine

## 2021-02-18 NOTE — Telephone Encounter (Signed)
Patient c/o Palpitations:  High priority if patient c/o lightheadedness, shortness of breath, or chest pain  1) How long have you had palpitations/irregular HR/ Afib? Are you having the symptoms now? Yes, 12 hours  2) Are you currently experiencing lightheadedness, SOB or CP? no  3) Do you have a history of afib (atrial fibrillation) or irregular heart rhythm? yes  4) Have you checked your BP or HR? (document readings if available): low HR, has not checked   5) Are you experiencing any other symptoms? No   Patient states he is in afib and would like to know if medication can be called in for him. He states he is out of town now. Informed patient it has been over 3 years since he has been seen so he is now a new patient and the office may not be able to send any medications without being seen. He states Dr. [name] knows him and he has not been in afib in 3 years. He would like a call back from a nurse on whether he can get medication called in for him.

## 2021-02-18 NOTE — Telephone Encounter (Signed)
Pt reports he has been out of rhythm since last night, he was eating a sandwich and "it just went out". He is currently out of town at [employer]. States HRs are controlled, he can just feel the afib. No symptoms. He is aware that it has been over 3 years since being seen in the office, and he may need to be seen before advisement can be made. Aware that I will forward to Mindi Junker, RN to address call when she returns to the office.  Aware she will need to discuss with Dr. [name]. Pt states he would make an appt but he is out of town.   Advised to go to local urgent care/ED if Afib becomes uncontrolled and/or symptoms warrant visit. Aware Mindi Junker will follow up Pt agreeable to plan.

## 2021-02-19 NOTE — Telephone Encounter (Signed)
Pt with history of PAF. Spoke with pt who [place] the beach and has now been in Afib with rates of 99-105 x 38 hours.  Pt has not been seen by EP in over 3 years.  Pt states he has expired Flecainide at home.  Pt asking if it is ok to have his cousin bring him the expired Flecainide to take.  Pt advised given the current circumstances would recommend he go to local ED for further evaluation and recommendations and follow up with EP once he has returned home.  Pt verbalizes understanding and agrees with current plan.

## 2021-02-19 NOTE — Telephone Encounter (Signed)
Patient calling back.   °

## 2021-02-20 ENCOUNTER — Other Ambulatory Visit: Payer: Self-pay

## 2021-02-20 ENCOUNTER — Encounter (HOSPITAL_COMMUNITY): Payer: Self-pay | Admitting: Pharmacy Technician

## 2021-02-20 ENCOUNTER — Emergency Department (HOSPITAL_COMMUNITY)
Admission: EM | Admit: 2021-02-20 | Discharge: 2021-02-21 | Disposition: A | Discharge status: Home / Self Care | Payer: 59 | Attending: Emergency Medicine | Admitting: Emergency Medicine

## 2021-02-20 DIAGNOSIS — Z96652 Presence of left artificial knee joint: Secondary | ICD-10-CM | POA: Insufficient documentation

## 2021-02-20 DIAGNOSIS — Z79899 Other long term (current) drug therapy: Secondary | ICD-10-CM | POA: Diagnosis not present

## 2021-02-20 DIAGNOSIS — I480 Paroxysmal atrial fibrillation: Secondary | ICD-10-CM | POA: Diagnosis not present

## 2021-02-20 DIAGNOSIS — R42 Dizziness and giddiness: Secondary | ICD-10-CM | POA: Diagnosis present

## 2021-02-20 DIAGNOSIS — Z7901 Long term (current) use of anticoagulants: Secondary | ICD-10-CM | POA: Diagnosis not present

## 2021-02-20 LAB — CBC WITH DIFFERENTIAL/PLATELET
Abs Immature Granulocytes: 0.09 10*3/uL — ABNORMAL HIGH (ref 0.00–0.07)
Basophils Absolute: 0.1 10*3/uL (ref 0.0–0.1)
Basophils Relative: 1 %
Eosinophils Absolute: 0.2 10*3/uL (ref 0.0–0.5)
Eosinophils Relative: 1 %
HCT: 49.1 % (ref 39.0–52.0)
Hemoglobin: 15.8 g/dL (ref 13.0–17.0)
Immature Granulocytes: 1 %
Lymphocytes Relative: 25 %
Lymphs Abs: 3.9 10*3/uL (ref 0.7–4.0)
MCH: 28.2 pg (ref 26.0–34.0)
MCHC: 32.2 g/dL (ref 30.0–36.0)
MCV: 87.7 fL (ref 80.0–100.0)
Monocytes Absolute: 0.8 10*3/uL (ref 0.1–1.0)
Monocytes Relative: 5 %
Neutro Abs: 10.3 10*3/uL — ABNORMAL HIGH (ref 1.7–7.7)
Neutrophils Relative %: 67 %
Platelets: 262 10*3/uL (ref 150–400)
RBC: 5.6 MIL/uL (ref 4.22–5.81)
RDW: 13.6 % (ref 11.5–15.5)
WBC: 15.3 10*3/uL — ABNORMAL HIGH (ref 4.0–10.5)
nRBC: 0 % (ref 0.0–0.2)

## 2021-02-20 LAB — BASIC METABOLIC PANEL
Anion gap: 11 (ref 5–15)
BUN: 21 mg/dL — ABNORMAL HIGH (ref 6–20)
CO2: 24 mmol/L (ref 22–32)
Calcium: 9.1 mg/dL (ref 8.9–10.3)
Chloride: 100 mmol/L (ref 98–111)
Creatinine, Ser: 1.04 mg/dL (ref 0.61–1.24)
GFR, Estimated: >60 mL/min (ref >60)
Glucose, Bld: 114 mg/dL — ABNORMAL HIGH (ref 70–99)
Potassium: 3.8 mmol/L (ref 3.5–5.1)
Sodium: 135 mmol/L (ref 135–145)

## 2021-02-20 MED ORDER — APIXABAN (ELIQUIS) EDUCATION KIT FOR DVT/PE PATIENTS: PACK | Freq: Once | Status: DC

## 2021-02-20 MED ORDER — METOPROLOL SUCCINATE ER 25 MG PO TB24: 12.5000 mg | ORAL_TABLET | Freq: Every day | ORAL | 0 refills | Status: DC

## 2021-02-20 MED ORDER — APIXABAN 5 MG PO TABS: 5.0000 mg | ORAL_TABLET | Freq: Two times a day (BID) | ORAL | 0 refills | Status: DC

## 2021-02-20 MED ORDER — APIXABAN 5 MG PO TABS
5.0000 mg | ORAL_TABLET | Freq: Two times a day (BID) | ORAL | Status: DC
  Administered 2021-02-20: 5 mg via ORAL
  Filled 2021-02-20: qty 1

## 2021-02-20 MED ORDER — METOPROLOL SUCCINATE ER 25 MG PO TB24
25.0000 mg | ORAL_TABLET | Freq: Once | ORAL | Status: AC
  Administered 2021-02-21: 25 mg via ORAL
  Filled 2021-02-20: qty 1

## 2021-02-20 NOTE — ED Triage Notes (Signed)
Pt here with reports of going into afib on Tuesday. Pt has been taking aspirin daily since Wednesday. Pt attempted to take 300mg  flecainide without relief last night. Hx of ablations in the past.

## 2021-02-20 NOTE — Discharge Instructions (Addendum)
Follow up with the afib clinic.  Return for worsening symptoms.

## 2021-02-20 NOTE — ED Provider Notes (Signed)
[facility] EMERGENCY DEPARTMENT Provider Note   CSN: [id number] Arrival date & time: 02/20/21  1720     History No chief complaint on file.   [name] is a 50 y.o. male.  50 yo M with a chief complaints of feeling like he is in A. fib.  Patient states that when he gets up to try and move quickly feels a little bit lightheaded.  He was at the beach when this started and continued to fish.  He had a bottle of flecainide that had expired a few years ago that he tried but without improvement.  He called his cardiologist who suggested he come to the ED but he was still at the beach and so when he returned to home today he decided to present to the ED.  Has had symptoms for about 3 days now.  Denies infectious symptoms.  Denies chest pain.  Denies lower extremity edema.  Has a history of paroxysmal A. fib has not had an event in about 5 years or so.  The history is provided by the patient and the spouse.  Illness Severity:  Moderate Onset quality:  Gradual Duration:  3 hours Timing:  Intermittent Progression:  Waxing and waning Chronicity:  New Associated symptoms: fatigue   Associated symptoms: no abdominal pain, no chest pain, no congestion, no diarrhea, no fever, no headaches, no myalgias, no rash, no shortness of breath and no vomiting        Past Medical History:  Diagnosis Date  . Arnold-Chiari malformation (HCC) 2002   s/p decompression  . 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 Colon polyps 2011   rpt Q5 yrs  . DDD (degenerative disc disease) SPINE  . History of kidney stones   . HLD (hyperlipidemia)   . Internal hemorrhoids   . Kidney stone   . OSA on CPAP DR CLANCE   CPAP  . Paroxysmal A-fib (HCC) CARDIOLOGIST-  DR [name]-- LAST VISIT 10-22-2010 IN [facility]   controlled with flecainide 300mg  prn- this medication is out of date  . Seasonal allergies   . Sleep apnea    wears CPAP    Patient Active Problem List   Diagnosis Date  Noted  . Family history of premature CAD 06/26/2019  . S/P left knee arthroscopy 08/24/2018  . Osteoarthritis of left knee 07/26/2018  . Kidney stones 01/19/2012  . Preoperative clearance 09/10/2011  . Smoker 07/22/2011  . Left groin pain 04/27/2011  . DEGENERATIVE DISC DISEASE, LUMBOSACRAL SPINE W/RADICULOPATHY 09/22/2010  . GERD 04/09/2010  . OSA on CPAP 03/16/2010  . BACK PAIN, THORACIC REGION 02/24/2010  . LOW BACK PAIN SYNDROME 02/24/2010  . HLD (hyperlipidemia) 01/15/2009  . ATRIAL FIBRILLATION, PAROXYSMAL 01/15/2009    Past Surgical History:  Procedure Laterality Date  . ADENOIDECTOMY  AGE 30  . ANTERIOR LUMBAR DISKECTOMY / DECOMPRESSION   10-08-2010   L5 - S1, LEFT SIDE ([place])  . ANTERIOR LUMBAR FUSION  07/2014   L4/5, L5/S1 [facility]  . CARDIOVERSION  1996, 2008   x2 in 1996  . COLONOSCOPY  2011   polyp, rec rpt 5 yrs  . COLONOSCOPY  12/2019   int hem, rpt 10 yrs 01/2020)  . CT chest noncontrast  06/2011   f/u L pulm nodule - gone.  07/2011 HIATAL HERNIA REPAIR  1999  . HIP ARTHROSCOPY  09/23/2011   LEFT HIP [facility]   . KNEE ARTHROSCOPY WITH MEDIAL MENISECTOMY  Left 08/24/2018   Procedure: LEFT KNEE ARTHROSCOPY WITH PARTIAL MEDIAL MENISECTOMY, ARTHROSCOPIC INTERNAL FIXATION MEDIAL FEMORAL CONDYLE AND MEDIAL TIBIAL PLATEAU, CHONDROPLASTY;  Surgeon: Eugenia Mcalpineollins, Robert, MD;  [facility] [place];  Service: Orthopedics;  Laterality: Left;  . SUBOCCIPITAL CRANIECTOMY CERVICAL LAMINECTOMY  11-30-2000  DR NUDALMAN   C1 AND PARTIAL C2--   CHIARI  MALFORMATION  . TOTAL KNEE ARTHROPLASTY Left 07/20/2019   Procedure: TOTAL KNEE ARTHROPLASTY;  Surgeon: Eugenia Mcalpineollins, Robert, MD;  Location: [facility];  Service: Orthopedics;  Laterality: Left;  with adductor canal  . URETEROSCOPY  03/02/2012   Procedure: URETEROSCOPY;  Surgeon: Marcine MatarStephen Dahlstedt, MD;  [facility] [place];  Service: Urology;  Laterality: Left;  stone obtained       Family History   Problem Relation Age of Onset  . Hypertension Mother   . Heart attack Mother 10665  . Heart disease Mother   . Coronary artery disease Mother 8765  . Coronary artery disease Father 2350       6v CABG  . Heart disease Father 8056       CABG  . Diabetes Father   . Alcohol abuse Father   . Liver cancer Father 2375  . Liver disease Other        Grandmother (P or M not specified)  . Cancer Paternal Uncle        lung, leukemia  . Suicidality Paternal Uncle   . Hemochromatosis Cousin   . Colon cancer Neg Hx   . Esophageal cancer Neg Hx   . Stomach cancer Neg Hx   . Rectal cancer Neg Hx     Social History   Tobacco Use  . Smoking status: Current Every Day Smoker    Packs/day: 1.00    Years: 20.00    Pack years: 20.00    Types: Cigarettes  . Smokeless tobacco: Never Used  . Tobacco comment: pack per day 04/12/2018   Vaping Use  . Vaping Use: Never used  Substance Use Topics  . Alcohol use: No  . Drug use: No    Home Medications Prior to Admission medications   Medication Sig Start Date End Date Taking? Authorizing Provider  apixaban (ELIQUIS) 5 MG TABS tablet Take 1 tablet (5 mg total) by mouth 2 (two) times daily. 02/20/21 03/22/21 Yes Melene PlanFloyd, Levelle Edelen, DO  metoprolol succinate (TOPROL-XL) 25 MG 24 hr tablet Take 0.5 tablets (12.5 mg total) by mouth daily. 02/20/21  Yes Melene PlanFloyd, Elvyn Krohn, DO  acetaminophen (TYLENOL) 325 MG tablet Take 650 mg by mouth every 6 (six) hours as needed (for pain.).    [provider]  celecoxib (CELEBREX) 200 MG capsule Take 200 mg by mouth at bedtime.     [provider]  gabapentin (NEURONTIN) 400 MG capsule Take 400 mg by mouth 3 (three) times daily.    [provider]  methocarbamol (ROBAXIN) 500 MG tablet Take 1 tablet (500 mg total) by mouth every 6 (six) hours as needed for muscle spasms. 07/20/19   Cherie DarkHaus, Leeanne R, PA  Multiple Vitamin (MULTIVITAMIN WITH MINERALS) TABS tablet Take 1 tablet by mouth every evening. Centrum 50+    [provider]  naproxen sodium (ALEVE) 220 MG tablet Take 440 mg by mouth daily as needed (pain.).    [provider]    Allergies    Ibuprofen  Review of Systems   Review of Systems  Constitutional: Positive for fatigue. Negative for chills and fever.  HENT: Negative for congestion and facial swelling.  Eyes: Negative for discharge and visual disturbance.  Respiratory: Negative for shortness of breath.   Cardiovascular: Positive for palpitations. Negative for chest pain.  Gastrointestinal: Negative for abdominal pain, diarrhea and vomiting.  Musculoskeletal: Negative for arthralgias and myalgias.  Skin: Negative for color change and rash.  Neurological: Negative for tremors, syncope and headaches.  Psychiatric/Behavioral: Negative for confusion and dysphoric mood.    Physical Exam Updated Vital Signs BP (!) 153/91   Pulse 80   Temp 98.3 F (36.8 C) (Oral)   Resp 18   SpO2 99%   Physical Exam Vitals and nursing note reviewed.  Constitutional:      Appearance: He is well-developed.  HENT:     Head: Normocephalic and atraumatic.  Eyes:     Pupils: Pupils are equal, round, and reactive to light.  Neck:     Vascular: No JVD.  Cardiovascular:     Rate and Rhythm: Normal rate. Rhythm irregular.     Heart sounds: No murmur heard. No friction rub. No gallop.   Pulmonary:     Effort: No respiratory distress.     Breath sounds: No wheezing.  Abdominal:     General: There is no distension.     Tenderness: There is no abdominal tenderness. There is no guarding or rebound.  Musculoskeletal:        General: Normal range of motion.     Cervical back: Normal range of motion and neck supple.  Skin:    Coloration: Skin is not pale.     Findings: No rash.  Neurological:     Mental Status: He is alert and oriented to person, place, and time.  Psychiatric:        Behavior: Behavior normal.     ED Results / Procedures / Treatments   Labs (all labs ordered are  listed, but only abnormal results are displayed) Labs Reviewed  BASIC METABOLIC PANEL - Abnormal; Notable for the following components:      Result Value   Glucose, Bld 114 (*)    BUN 21 (*)    All other components within normal limits  CBC WITH DIFFERENTIAL/PLATELET - Abnormal; Notable for the following components:   WBC 15.3 (*)    Neutro Abs 10.3 (*)    Abs Immature Granulocytes 0.09 (*)    All other components within normal limits    EKG EKG Interpretation  Date/Time:  Friday February 20 2021 22:31:07 EDT Ventricular Rate:  95 PR Interval:    QRS Duration: 96 QT Interval:  345 QTC Calculation: 434 R Axis:   97 Text Interpretation: Atrial fibrillation Borderline right axis deviation Borderline T abnormalities, inferior leads Since last tracing rate slower Otherwise no significant change Confirmed by Melene Plan [phone]) on 02/20/2021 11:01:18 PM   Radiology No results found.  Procedures Procedures   Medications Ordered in ED Medications  apixaban (ELIQUIS) tablet 5 mg (has no administration in time range)    ED Course  I have reviewed the triage vital signs and the nursing notes.  Pertinent labs & imaging results that were available during my care of the patient were reviewed by me and considered in my medical decision making (see chart for details).    MDM Rules/Calculators/A&P                          50 yo M with a chief complaints of palpitations going on for about 3 days now.  As the patient is outside the  48-hour window and is not on blood thinning medication will start on Eliquis and have him follow-up with A. fib clinic in the office.  Patient is rate controlled and well-appearing.  Lab work without significant electrolyte abnormality.  Mild leukocytosis of unknown etiology.  CHA2DS2/VAS Stroke Risk Points  Current as of 8 minutes ago     0 >= 2 Points: High Risk  1 - 1.99 Points: Medium Risk  0 Points: Low Risk    Last Change: N/A      Details    This score  determines the patient's risk of having a stroke if the  patient has atrial fibrillation.       Points Metrics  0 Has Congestive Heart Failure:  No    Current as of 8 minutes ago  0 Has Vascular Disease:  No    Current as of 8 minutes ago  0 Has Hypertension:  No    Current as of 8 minutes ago  0 Age:  14    Current as of 8 minutes ago  0 Has Diabetes:  No    Current as of 8 minutes ago  0 Had Stroke:  No  Had TIA:  No  Had Thromboembolism:  No    Current as of 8 minutes ago  0 Male:  No    Current as of 8 minutes ago          11:40 PM:  I have discussed the diagnosis/risks/treatment options with the patient and family and believe the pt to be eligible for discharge home to follow-up with Cards. We also discussed returning to the ED immediately if new or worsening sx occur. We discussed the sx which are most concerning (e.g., sudden worsening pain, fever, inability to tolerate by mouth) that necessitate immediate return. Medications administered to the patient during their visit and any new prescriptions provided to the patient are listed below.  Medications given during this visit Medications  apixaban (ELIQUIS) tablet 5 mg (has no administration in time range)     The patient appears reasonably screen and/or stabilized for discharge and I doubt any other medical condition or other [facility] requiring further screening, evaluation, or treatment in the ED at this time prior to discharge.   Final Clinical Impression(s) / ED Diagnoses Final diagnoses:  Paroxysmal atrial fibrillation (HCC)    Rx / DC Orders ED Discharge Orders         Ordered    apixaban (ELIQUIS) 5 MG TABS tablet  2 times daily        02/20/21 2329    metoprolol succinate (TOPROL-XL) 25 MG 24 hr tablet  Daily        02/20/21 2329           Melene Plan, DO 02/20/21 2340

## 2021-02-20 NOTE — ED Notes (Signed)
Pt stepped outside.  

## 2021-02-20 NOTE — ED Provider Notes (Signed)
Emergency Medicine Provider Triage Evaluation Note  [name] , a 50 y.o. male  was evaluated in triage.  Pt complains of possible A. fib.  Feels that he has been in A. fib for the past 2 days.  Reports history of same but this was years ago.  He used to be on flecainide so he took 300 mg of this last night from a leftover pill bottle that expired in 2019.  He woke up this morning was still in A. fib.  Still having palpitations.  No chest pain or shortness of breath other than when he wears a mask he feels that it restricts his breathing.  Review of Systems  Positive: Irregular heartbeat Negative: Chest pain, shortness of breath  Physical Exam  BP 138/89 (BP Location: Right Arm)   Pulse (!) 101   Temp 98.3 F (36.8 C) (Oral)   Resp 16   SpO2 95%  Gen:   Awake, no distress   Resp:  Normal effort  MSK:   Moves extremities without difficulty  Other:  No signs of respiratory distress  Medical Decision Making  Medically screening exam initiated at 6:37 PM.  Appropriate orders placed.  [name] was informed that the remainder of the evaluation will be completed by another provider, this initial triage assessment does not replace that evaluation, and the importance of remaining in the ED until their evaluation is complete.  Labs and EKG ordered   [name], Cordelia Poche 02/20/21 [id number]    Tegeler, Canary Brim, MD 02/20/21 2036

## 2021-02-21 NOTE — ED Notes (Signed)
Pt discharged and ambulated out of the ED without difficulty. 

## 2021-02-24 ENCOUNTER — Ambulatory Visit (HOSPITAL_COMMUNITY)
Admission: RE | Admit: 2021-02-24 | Discharge: 2021-02-24 | Disposition: A | Discharge status: Home / Self Care | Payer: 59 | Source: Ambulatory Visit | Attending: Physician Assistant | Admitting: Physician Assistant

## 2021-02-24 ENCOUNTER — Encounter (HOSPITAL_COMMUNITY): Payer: Self-pay | Admitting: Physician Assistant

## 2021-02-24 ENCOUNTER — Other Ambulatory Visit: Payer: Self-pay

## 2021-02-24 VITALS — BP 130/74 | HR 94 | Ht 75.0 in | Wt 269.0 lb

## 2021-02-24 DIAGNOSIS — Z6833 Body mass index (BMI) 33.0-33.9, adult: Secondary | ICD-10-CM | POA: Diagnosis not present

## 2021-02-24 DIAGNOSIS — I4819 Other persistent atrial fibrillation: Secondary | ICD-10-CM | POA: Diagnosis present

## 2021-02-24 DIAGNOSIS — E669 Obesity, unspecified: Secondary | ICD-10-CM | POA: Insufficient documentation

## 2021-02-24 DIAGNOSIS — G4733 Obstructive sleep apnea (adult) (pediatric): Secondary | ICD-10-CM | POA: Insufficient documentation

## 2021-02-24 DIAGNOSIS — F17210 Nicotine dependence, cigarettes, uncomplicated: Secondary | ICD-10-CM | POA: Diagnosis not present

## 2021-02-24 DIAGNOSIS — Z8249 Family history of ischemic heart disease and other diseases of the circulatory system: Secondary | ICD-10-CM | POA: Diagnosis not present

## 2021-02-24 DIAGNOSIS — E785 Hyperlipidemia, unspecified: Secondary | ICD-10-CM | POA: Diagnosis not present

## 2021-02-24 DIAGNOSIS — Z79899 Other long term (current) drug therapy: Secondary | ICD-10-CM | POA: Diagnosis not present

## 2021-02-24 DIAGNOSIS — Z7901 Long term (current) use of anticoagulants: Secondary | ICD-10-CM | POA: Diagnosis not present

## 2021-02-24 MED ORDER — APIXABAN 5 MG PO TABS: 5.0000 mg | ORAL_TABLET | Freq: Two times a day (BID) | ORAL | 3 refills | Status: DC

## 2021-02-24 MED ORDER — METOPROLOL SUCCINATE ER 25 MG PO TB24: 12.5000 mg | ORAL_TABLET | Freq: Every day | ORAL | 3 refills | Status: DC

## 2021-02-24 NOTE — H&P (View-Only) (Signed)
Primary Care Physician: Eustaquio Boyden, MD Primary Electrophysiologist: Dr [name] (remotely) Referring Physician: [facility]   [name] is a 50 y.o. male with a history of HLD, OSA, atrial fibrillation who presents for consultation in the [facility].  The patient was initially diagnosed with atrial fibrillation remotely and had three cardioversions in the 1990s. Seen remotely by Dr [name] and maintained on PIP flecainide. Patient has a CHADS2VASC score of 0. He presented to the ED 02/20/21 after having several days of palpitations. He tried flecainide but remained in afib. ECG at ED confirmed afib. He did not undergo DCCV as he was outside the 48 hour window. He was discharged on Eliquis and metoprolol. He does have symptoms of fatigue and intermittent dizziness.   Today, he denies symptoms of palpitations, chest pain, shortness of breath, orthopnea, PND, lower extremity edema, presyncope, syncope, bleeding, or neurologic sequela. The patient is tolerating medications without difficulties and is otherwise without complaint today.    Atrial Fibrillation Risk Factors:  he does have symptoms or diagnosis of sleep apnea. he is compliant with CPAP therapy. he does not have a history of rheumatic fever. he does not have a history of alcohol use. The patient does have a history of early familial atrial fibrillation or other arrhythmias. Mother has afib.  he has a BMI of Body mass index is 33.62 kg/m.Marland Kitchen Filed Weights   02/24/21 0833  Weight: 122 kg    Family History  Problem Relation Age of Onset  . Hypertension Mother   . Heart attack Mother 27  . Heart disease Mother   . Coronary artery disease Mother 82  . Coronary artery disease Father 67       6v CABG  . Heart disease Father 29       CABG  . Diabetes Father   . Alcohol abuse Father   . Liver cancer Father 63  . Liver disease Other        Grandmother (P or M not specified)  . Cancer Paternal  Uncle        lung, leukemia  . Suicidality Paternal Uncle   . Hemochromatosis Cousin   . Colon cancer Neg Hx   . Esophageal cancer Neg Hx   . Stomach cancer Neg Hx   . Rectal cancer Neg Hx      Atrial Fibrillation Management history:  Previous antiarrhythmic drugs: flecainide PIP Previous cardioversions: 1996 x 2, 2008 Previous ablations: none CHADS2VASC score: 0 Anticoagulation history: Eliquis   Past Medical History:  Diagnosis Date  . Arnold-Chiari malformation (HCC) 2002   s/p decompression  . 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 Colon polyps 2011   rpt Q5 yrs  . DDD (degenerative disc disease) SPINE  . History of kidney stones   . HLD (hyperlipidemia)   . Internal hemorrhoids   . Kidney stone   . OSA on CPAP DR CLANCE   CPAP  . Paroxysmal A-fib (HCC) CARDIOLOGIST-  DR [name]-- LAST VISIT 10-22-2010 IN [facility]   controlled with flecainide 300mg  prn- this medication is out of date  . Seasonal allergies   . Sleep apnea    wears CPAP   Past Surgical History:  Procedure Laterality Date  . ADENOIDECTOMY  AGE 5  . ANTERIOR LUMBAR DISKECTOMY / DECOMPRESSION   10-08-2010   L5 - S1, LEFT SIDE ([facility])  . ANTERIOR LUMBAR FUSION  07/2014   L4/5, L5/S1 [facility]  . CARDIOVERSION  1996, 2008   x2 in 1996  . COLONOSCOPY  2011   polyp, rec rpt 5 yrs  . COLONOSCOPY  12/2019   int hem, rpt 10 yrs Marina Goodell)  . CT chest noncontrast  06/2011   f/u L pulm nodule - gone.  Marland Kitchen HIATAL HERNIA REPAIR  1999  . HIP ARTHROSCOPY  09/23/2011   LEFT HIP [facility]   . KNEE ARTHROSCOPY WITH MEDIAL MENISECTOMY Left 08/24/2018   Procedure: LEFT KNEE ARTHROSCOPY WITH PARTIAL MEDIAL MENISECTOMY, ARTHROSCOPIC INTERNAL FIXATION MEDIAL FEMORAL CONDYLE AND MEDIAL TIBIAL PLATEAU, CHONDROPLASTY;  Surgeon: Eugenia Mcalpine, MD;  Location: [facility] [place];  Service: Orthopedics;  Laterality: Left;  . SUBOCCIPITAL CRANIECTOMY CERVICAL LAMINECTOMY  11-30-2000  DR  NUDALMAN   C1 AND PARTIAL C2--   CHIARI  MALFORMATION  . TOTAL KNEE ARTHROPLASTY Left 07/20/2019   Procedure: TOTAL KNEE ARTHROPLASTY;  Surgeon: Eugenia Mcalpine, MD;  Location: [facility];  Service: Orthopedics;  Laterality: Left;  with adductor canal  . URETEROSCOPY  03/02/2012   Procedure: URETEROSCOPY;  Surgeon: Marcine Matar, MD;  Location: [facility];  Service: Urology;  Laterality: Left;  stone obtained    Current Outpatient Medications  Medication Sig Dispense Refill  . acetaminophen (TYLENOL) 325 MG tablet Take 650 mg by mouth every 6 (six) hours as needed (for pain.).    Marland Kitchen apixaban (ELIQUIS) 5 MG TABS tablet Take 1 tablet (5 mg total) by mouth 2 (two) times daily. 60 tablet 0  . celecoxib (CELEBREX) 200 MG capsule Take 200 mg by mouth at bedtime.    . cholecalciferol (VITAMIN D3) 25 MCG (1000 UNIT) tablet Take 1,000 Units by mouth daily.    Marland Kitchen gabapentin (NEURONTIN) 400 MG capsule Take 400 mg by mouth 3 (three) times daily.    . metoprolol succinate (TOPROL-XL) 25 MG 24 hr tablet Take 0.5 tablets (12.5 mg total) by mouth daily. 30 tablet 0  . Multiple Vitamin (MULTIVITAMIN WITH MINERALS) TABS tablet Take 1 tablet by mouth every evening. Centrum 50+    . naproxen sodium (ALEVE) 220 MG tablet Take 440 mg by mouth daily as needed (pain.).    Marland Kitchen oxyCODONE (OXY IR/ROXICODONE) 5 MG immediate release tablet Take 5 mg by mouth 2 (two) times daily as needed.    . vitamin B-12 (CYANOCOBALAMIN) 1000 MCG tablet Take 1,000 mcg by mouth daily.    Marland Kitchen zinc gluconate 50 MG tablet Take 50 mg by mouth daily.     No current facility-administered medications for this encounter.    Allergies  Allergen Reactions  . Ibuprofen Swelling    Social History   Socioeconomic History  . Marital status: Married    Spouse name: Not on file  . Number of children: Not on file  . Years of education: Not on file  . Highest education level: Not on file  Occupational History  . Not on file   Tobacco Use  . Smoking status: Current Every Day Smoker    Packs/day: 1.00    Years: 20.00    Pack years: 20.00    Types: Cigarettes  . Smokeless tobacco: Never Used  . Tobacco comment: pack per day 04/12/2018   Vaping Use  . Vaping Use: Never used  Substance and Sexual Activity  . Alcohol use: No  . Drug use: No  . Sexual activity: Yes  Other Topics Concern  . Not on file  Social History Narrative   Caffeine: 30oz mountain dew   Lives with  wife, 1 daughter, 1 step daughter, 2 dogs   Occupation: Art gallery manager for [employer]   Activity: no regular exercise, trouble with back pain   Diet: good, vegetables.   Social Determinants of Health   Financial Resource Strain: Not on file  Food Insecurity: Not on file  Transportation Needs: Not on file  Physical Activity: Not on file  Stress: Not on file  Social Connections: Not on file  Intimate Partner Violence: Not on file     ROS- All systems are reviewed and negative except as per the HPI above.  Physical Exam: Vitals:   02/24/21 0833  BP: 130/74  Pulse: 94  Weight: 122 kg  Height: 6\' 3"  (1.905 m)    GEN- The patient is a well appearing obese male, alert and oriented x 3 today.   Head- normocephalic, atraumatic Eyes-  Sclera clear, conjunctiva pink Ears- hearing intact Oropharynx- clear Neck- supple  Lungs- Clear to ausculation bilaterally, normal work of breathing Heart- irregular rate and rhythm, no murmurs, rubs or gallops  GI- soft, NT, ND, + BS Extremities- no clubbing, cyanosis, or edema MS- no significant deformity or atrophy Skin- no rash or lesion Psych- euthymic mood, full affect Neuro- strength and sensation are intact  Wt Readings from Last 3 Encounters:  02/24/21 122 kg  12/26/19 115.7 kg  12/12/19 115.7 kg    EKG today demonstrates  Afib Vent. rate 94 BPM PR interval * ms QRS duration 100 ms QT/QTcB 320/400 ms  Epic records are reviewed at length  today  CHA2DS2-VASc Score = 0  The patient's score is based upon: CHF History: No HTN History: No Diabetes History: No Stroke History: No Vascular Disease History: No Age Score: 0 Gender Score: 0      ASSESSMENT AND PLAN: 1. Persistent Atrial Fibrillation The patient's CHA2DS2-VASc score is 0, indicating a 0.2% annual risk of stroke.   General education about afib provided and questions answered. We also discussed his stroke risk and the risks and benefits of anticoagulation. Will plan for DCCV after 3 weeks of uninterrupted anticoagulation. (6/24). Check bmet/cbc prior Continue Eliquis 5 mg BID Continue Toprol 12.5 mg daily Post DCCV, will send in new Rx for PIP flecainide 300 mg q 4 days.  2. Obesity Body mass index is 33.62 kg/m. Lifestyle modification was discussed at length including regular exercise and weight reduction.  3. Obstructive sleep apnea The importance of adequate treatment of sleep apnea was discussed today in order to improve our ability to maintain sinus rhythm long term. Patient reports compliance with CPAP therapy.    Follow up in the AF clinic post DCCV.    03-05-2001 PA-C Afib Clinic [facility] [address] [place], [place] [phone] 02/24/2021 8:41 AM

## 2021-02-24 NOTE — Patient Instructions (Signed)
Cardioversion scheduled for Tuesday, June 28th  - Come to afib clinic at 930AM for labs  - Arrive at the [facility] and go to admitting at [facility]  - Do not eat or drink anything after midnight the night prior to your procedure.  - Take all your morning medication (except diabetic medications) with a sip of water prior to arrival.  - You will not be able to drive home after your procedure.  - Do NOT miss any doses of your blood thinner - if you should miss a dose please notify our office immediately.  - If you feel as if you go back into normal rhythm prior to scheduled cardioversion, please notify our office immediately. If your procedure is canceled in the cardioversion suite you will be charged a cancellation fee.  Patients will be asked to: to mask in public and hand hygiene (no longer quarantine) in the 3 days prior to surgery, to report if any COVID-19-like illness or household contacts to COVID-19 to determine need for testing

## 2021-02-24 NOTE — Progress Notes (Signed)
Primary Care Physician: Eustaquio Boyden, MD Primary Electrophysiologist: Dr [name] (remotely) Referring Physician: [facility]   [name] is a 50 y.o. male with a history of HLD, OSA, atrial fibrillation who presents for consultation in the [facility].  The patient was initially diagnosed with atrial fibrillation remotely and had three cardioversions in the 1990s. Seen remotely by Dr [name] and maintained on PIP flecainide. Patient has a CHADS2VASC score of 0. He presented to the ED 02/20/21 after having several days of palpitations. He tried flecainide but remained in afib. ECG at ED confirmed afib. He did not undergo DCCV as he was outside the 48 hour window. He was discharged on Eliquis and metoprolol. He does have symptoms of fatigue and intermittent dizziness.   Today, he denies symptoms of palpitations, chest pain, shortness of breath, orthopnea, PND, lower extremity edema, presyncope, syncope, bleeding, or neurologic sequela. The patient is tolerating medications without difficulties and is otherwise without complaint today.    Atrial Fibrillation Risk Factors:  he does have symptoms or diagnosis of sleep apnea. he is compliant with CPAP therapy. he does not have a history of rheumatic fever. he does not have a history of alcohol use. The patient does have a history of early familial atrial fibrillation or other arrhythmias. Mother has afib.  he has a BMI of Body mass index is 33.62 kg/m.Marland Kitchen Filed Weights   02/24/21 0833  Weight: 122 kg    Family History  Problem Relation Age of Onset  . Hypertension Mother   . Heart attack Mother 27  . Heart disease Mother   . Coronary artery disease Mother 82  . Coronary artery disease Father 67       6v CABG  . Heart disease Father 29       CABG  . Diabetes Father   . Alcohol abuse Father   . Liver cancer Father 63  . Liver disease Other        Grandmother (P or M not specified)  . Cancer Paternal  Uncle        lung, leukemia  . Suicidality Paternal Uncle   . Hemochromatosis Cousin   . Colon cancer Neg Hx   . Esophageal cancer Neg Hx   . Stomach cancer Neg Hx   . Rectal cancer Neg Hx      Atrial Fibrillation Management history:  Previous antiarrhythmic drugs: flecainide PIP Previous cardioversions: 1996 x 2, 2008 Previous ablations: none CHADS2VASC score: 0 Anticoagulation history: Eliquis   Past Medical History:  Diagnosis Date  . Arnold-Chiari malformation (HCC) 2002   s/p decompression  . 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 Colon polyps 2011   rpt Q5 yrs  . DDD (degenerative disc disease) SPINE  . History of kidney stones   . HLD (hyperlipidemia)   . Internal hemorrhoids   . Kidney stone   . OSA on CPAP DR CLANCE   CPAP  . Paroxysmal A-fib (HCC) CARDIOLOGIST-  DR [name]-- LAST VISIT 10-22-2010 IN [facility]   controlled with flecainide 300mg  prn- this medication is out of date  . Seasonal allergies   . Sleep apnea    wears CPAP   Past Surgical History:  Procedure Laterality Date  . ADENOIDECTOMY  AGE 5  . ANTERIOR LUMBAR DISKECTOMY / DECOMPRESSION   10-08-2010   L5 - S1, LEFT SIDE ([facility])  . ANTERIOR LUMBAR FUSION  07/2014   L4/5, L5/S1 [facility]  . CARDIOVERSION  1996, 2008   x2 in 1996  . COLONOSCOPY  2011   polyp, rec rpt 5 yrs  . COLONOSCOPY  12/2019   int hem, rpt 10 yrs Marina Goodell)  . CT chest noncontrast  06/2011   f/u L pulm nodule - gone.  Marland Kitchen HIATAL HERNIA REPAIR  1999  . HIP ARTHROSCOPY  09/23/2011   LEFT HIP [facility]   . KNEE ARTHROSCOPY WITH MEDIAL MENISECTOMY Left 08/24/2018   Procedure: LEFT KNEE ARTHROSCOPY WITH PARTIAL MEDIAL MENISECTOMY, ARTHROSCOPIC INTERNAL FIXATION MEDIAL FEMORAL CONDYLE AND MEDIAL TIBIAL PLATEAU, CHONDROPLASTY;  Surgeon: Eugenia Mcalpine, MD;  Location: [facility] [place];  Service: Orthopedics;  Laterality: Left;  . SUBOCCIPITAL CRANIECTOMY CERVICAL LAMINECTOMY  11-30-2000  DR  NUDALMAN   C1 AND PARTIAL C2--   CHIARI  MALFORMATION  . TOTAL KNEE ARTHROPLASTY Left 07/20/2019   Procedure: TOTAL KNEE ARTHROPLASTY;  Surgeon: Eugenia Mcalpine, MD;  Location: [facility];  Service: Orthopedics;  Laterality: Left;  with adductor canal  . URETEROSCOPY  03/02/2012   Procedure: URETEROSCOPY;  Surgeon: Marcine Matar, MD;  Location: [facility];  Service: Urology;  Laterality: Left;  stone obtained    Current Outpatient Medications  Medication Sig Dispense Refill  . acetaminophen (TYLENOL) 325 MG tablet Take 650 mg by mouth every 6 (six) hours as needed (for pain.).    Marland Kitchen apixaban (ELIQUIS) 5 MG TABS tablet Take 1 tablet (5 mg total) by mouth 2 (two) times daily. 60 tablet 0  . celecoxib (CELEBREX) 200 MG capsule Take 200 mg by mouth at bedtime.    . cholecalciferol (VITAMIN D3) 25 MCG (1000 UNIT) tablet Take 1,000 Units by mouth daily.    Marland Kitchen gabapentin (NEURONTIN) 400 MG capsule Take 400 mg by mouth 3 (three) times daily.    . metoprolol succinate (TOPROL-XL) 25 MG 24 hr tablet Take 0.5 tablets (12.5 mg total) by mouth daily. 30 tablet 0  . Multiple Vitamin (MULTIVITAMIN WITH MINERALS) TABS tablet Take 1 tablet by mouth every evening. Centrum 50+    . naproxen sodium (ALEVE) 220 MG tablet Take 440 mg by mouth daily as needed (pain.).    Marland Kitchen oxyCODONE (OXY IR/ROXICODONE) 5 MG immediate release tablet Take 5 mg by mouth 2 (two) times daily as needed.    . vitamin B-12 (CYANOCOBALAMIN) 1000 MCG tablet Take 1,000 mcg by mouth daily.    Marland Kitchen zinc gluconate 50 MG tablet Take 50 mg by mouth daily.     No current facility-administered medications for this encounter.    Allergies  Allergen Reactions  . Ibuprofen Swelling    Social History   Socioeconomic History  . Marital status: Married    Spouse name: Not on file  . Number of children: Not on file  . Years of education: Not on file  . Highest education level: Not on file  Occupational History  . Not on file   Tobacco Use  . Smoking status: Current Every Day Smoker    Packs/day: 1.00    Years: 20.00    Pack years: 20.00    Types: Cigarettes  . Smokeless tobacco: Never Used  . Tobacco comment: pack per day 04/12/2018   Vaping Use  . Vaping Use: Never used  Substance and Sexual Activity  . Alcohol use: No  . Drug use: No  . Sexual activity: Yes  Other Topics Concern  . Not on file  Social History Narrative   Caffeine: 30oz mountain dew   Lives with  wife, 1 daughter, 1 step daughter, 2 dogs   Occupation: Art gallery manager for [employer]   Activity: no regular exercise, trouble with back pain   Diet: good, vegetables.   Social Determinants of Health   Financial Resource Strain: Not on file  Food Insecurity: Not on file  Transportation Needs: Not on file  Physical Activity: Not on file  Stress: Not on file  Social Connections: Not on file  Intimate Partner Violence: Not on file     ROS- All systems are reviewed and negative except as per the HPI above.  Physical Exam: Vitals:   02/24/21 0833  BP: 130/74  Pulse: 94  Weight: 122 kg  Height: 6\' 3"  (1.905 m)    GEN- The patient is a well appearing obese male, alert and oriented x 3 today.   Head- normocephalic, atraumatic Eyes-  Sclera clear, conjunctiva pink Ears- hearing intact Oropharynx- clear Neck- supple  Lungs- Clear to ausculation bilaterally, normal work of breathing Heart- irregular rate and rhythm, no murmurs, rubs or gallops  GI- soft, NT, ND, + BS Extremities- no clubbing, cyanosis, or edema MS- no significant deformity or atrophy Skin- no rash or lesion Psych- euthymic mood, full affect Neuro- strength and sensation are intact  Wt Readings from Last 3 Encounters:  02/24/21 122 kg  12/26/19 115.7 kg  12/12/19 115.7 kg    EKG today demonstrates  Afib Vent. rate 94 BPM PR interval * ms QRS duration 100 ms QT/QTcB 320/400 ms  Epic records are reviewed at length  today  CHA2DS2-VASc Score = 0  The patient's score is based upon: CHF History: No HTN History: No Diabetes History: No Stroke History: No Vascular Disease History: No Age Score: 0 Gender Score: 0      ASSESSMENT AND PLAN: 1. Persistent Atrial Fibrillation The patient's CHA2DS2-VASc score is 0, indicating a 0.2% annual risk of stroke.   General education about afib provided and questions answered. We also discussed his stroke risk and the risks and benefits of anticoagulation. Will plan for DCCV after 3 weeks of uninterrupted anticoagulation. (6/24). Check bmet/cbc prior Continue Eliquis 5 mg BID Continue Toprol 12.5 mg daily Post DCCV, will send in new Rx for PIP flecainide 300 mg q 4 days.  2. Obesity Body mass index is 33.62 kg/m. Lifestyle modification was discussed at length including regular exercise and weight reduction.  3. Obstructive sleep apnea The importance of adequate treatment of sleep apnea was discussed today in order to improve our ability to maintain sinus rhythm long term. Patient reports compliance with CPAP therapy.    Follow up in the AF clinic post DCCV.    03-05-2001 PA-C Afib Clinic [facility] [address] [place], [place] [phone] 02/24/2021 8:41 AM

## 2021-03-04 DIAGNOSIS — F17200 Nicotine dependence, unspecified, uncomplicated: Secondary | ICD-10-CM | POA: Insufficient documentation

## 2021-03-04 DIAGNOSIS — G5623 Lesion of ulnar nerve, bilateral upper limbs: Secondary | ICD-10-CM | POA: Insufficient documentation

## 2021-03-04 DIAGNOSIS — M25521 Pain in right elbow: Secondary | ICD-10-CM | POA: Insufficient documentation

## 2021-03-09 DIAGNOSIS — G5603 Carpal tunnel syndrome, bilateral upper limbs: Secondary | ICD-10-CM | POA: Insufficient documentation

## 2021-03-17 ENCOUNTER — Other Ambulatory Visit: Payer: Self-pay

## 2021-03-17 ENCOUNTER — Encounter (HOSPITAL_COMMUNITY): Payer: Self-pay | Admitting: Cardiovascular Disease

## 2021-03-17 ENCOUNTER — Ambulatory Visit (HOSPITAL_COMMUNITY)
Admission: RE | Admit: 2021-03-17 | Discharge: 2021-03-17 | Disposition: A | Discharge status: Home / Self Care | Payer: 59 | Attending: Cardiovascular Disease | Admitting: Cardiovascular Disease

## 2021-03-17 ENCOUNTER — Ambulatory Visit (HOSPITAL_COMMUNITY): Payer: 59 | Admitting: Anesthesiology

## 2021-03-17 ENCOUNTER — Other Ambulatory Visit (HOSPITAL_COMMUNITY): Payer: 59 | Admitting: Physician Assistant

## 2021-03-17 ENCOUNTER — Encounter (HOSPITAL_COMMUNITY)
Admission: RE | Disposition: A | Discharge status: Home / Self Care | Payer: Self-pay | Source: Home / Self Care | Attending: Cardiovascular Disease

## 2021-03-17 DIAGNOSIS — G4733 Obstructive sleep apnea (adult) (pediatric): Secondary | ICD-10-CM | POA: Insufficient documentation

## 2021-03-17 DIAGNOSIS — I4819 Other persistent atrial fibrillation: Secondary | ICD-10-CM | POA: Diagnosis present

## 2021-03-17 DIAGNOSIS — E669 Obesity, unspecified: Secondary | ICD-10-CM | POA: Insufficient documentation

## 2021-03-17 DIAGNOSIS — Z6833 Body mass index (BMI) 33.0-33.9, adult: Secondary | ICD-10-CM | POA: Diagnosis not present

## 2021-03-17 DIAGNOSIS — Z7901 Long term (current) use of anticoagulants: Secondary | ICD-10-CM | POA: Insufficient documentation

## 2021-03-17 DIAGNOSIS — F17210 Nicotine dependence, cigarettes, uncomplicated: Secondary | ICD-10-CM | POA: Insufficient documentation

## 2021-03-17 DIAGNOSIS — Z79899 Other long term (current) drug therapy: Secondary | ICD-10-CM | POA: Diagnosis not present

## 2021-03-17 HISTORY — PX: CARDIOVERSION: SHX1299

## 2021-03-17 SURGERY — CARDIOVERSION
Anesthesia: General

## 2021-03-17 MED ORDER — PROPOFOL 10 MG/ML IV BOLUS
INTRAVENOUS | Status: DC | PRN
  Administered 2021-03-17 (×2): 20 mg via INTRAVENOUS
  Administered 2021-03-17: 60 mg via INTRAVENOUS

## 2021-03-17 MED ORDER — LIDOCAINE 2% (20 MG/ML) 5 ML SYRINGE
INTRAMUSCULAR | Status: DC | PRN
  Administered 2021-03-17: 100 mg via INTRAVENOUS

## 2021-03-17 MED ORDER — SODIUM CHLORIDE 0.9 % IV SOLN: INTRAVENOUS | Status: DC | PRN

## 2021-03-17 NOTE — Anesthesia Postprocedure Evaluation (Signed)
Anesthesia Post Note  Patient: [name]  Procedure(s) Performed: CARDIOVERSION     Patient location during evaluation: Endoscopy Anesthesia Type: MAC Level of consciousness: awake and alert Pain management: pain level controlled Vital Signs Assessment: post-procedure vital signs reviewed and stable Respiratory status: spontaneous breathing, nonlabored ventilation and respiratory function stable Cardiovascular status: blood pressure returned to baseline and stable Postop Assessment: no apparent nausea or vomiting Anesthetic complications: no   No notable events documented.  Last Vitals:  Vitals:   03/17/21 1120 03/17/21 1125  BP: 123/78 120/65  Pulse: 78 79  Resp: 16 (!) 28  Temp:    SpO2: 95% 96%    Last Pain:  Vitals:   03/17/21 1125  TempSrc:   PainSc: 0-No pain                 Lidia Collum

## 2021-03-17 NOTE — Anesthesia Preprocedure Evaluation (Signed)
Anesthesia Evaluation  Patient identified by MRN, date of birth, ID band Patient awake    Reviewed: Allergy & Precautions, NPO status , Patient's Chart, lab work & pertinent test results  History of Anesthesia Complications Negative for: history of anesthetic complications  Airway Mallampati: II  TM Distance: >3 FB Neck ROM: Full    Dental   Pulmonary sleep apnea and Continuous Positive Airway Pressure Ventilation , Current Smoker,    Pulmonary exam normal        Cardiovascular + dysrhythmias Atrial Fibrillation  Rhythm:Irregular Rate:Normal     Neuro/Psych Arnold-Chiari malformation s/p decompression negative neurological ROS     GI/Hepatic Neg liver ROS, GERD  ,  Endo/Other  negative endocrine ROS  Renal/GU negative Renal ROS  negative genitourinary   Musculoskeletal  (+) Arthritis ,   Abdominal   Peds  Hematology negative hematology ROS (+)   Anesthesia Other Findings   Reproductive/Obstetrics                            Anesthesia Physical Anesthesia Plan  ASA: 3  Anesthesia Plan: General   Post-op Pain Management:    Induction: Intravenous  PONV Risk Score and Plan: 2 and TIVA and Treatment may vary due to age or medical condition  Airway Management Planned: Mask  Additional Equipment: None  Intra-op Plan:   Post-operative Plan:   Informed Consent: I have reviewed the patients History and Physical, chart, labs and discussed the procedure including the risks, benefits and alternatives for the proposed anesthesia with the patient or authorized representative who has indicated his/her understanding and acceptance.       Plan Discussed with:   Anesthesia Plan Comments:         Anesthesia Quick Evaluation

## 2021-03-17 NOTE — Anesthesia Procedure Notes (Signed)
Procedure Name: General with mask airway Date/Time: 03/17/2021 11:05 AM Performed by: Elliot Dally, CRNA Pre-anesthesia Checklist: Patient identified, Emergency Drugs available, Suction available, Patient being monitored and Timeout performed Patient Re-evaluated:Patient Re-evaluated prior to induction Oxygen Delivery Method: Ambu bag

## 2021-03-17 NOTE — Transfer of Care (Signed)
Immediate Anesthesia Transfer of Care Note  Patient: [name]  Procedure(s) Performed: CARDIOVERSION  Patient Location: Endoscopy Unit  Anesthesia Type:General  Level of Consciousness: drowsy  Airway & Oxygen Therapy: Patient Spontanous Breathing  Post-op Assessment: Report given to RN and Post -op Vital signs reviewed and stable  Post vital signs: Reviewed and stable  Last Vitals:  Vitals Value Taken Time  BP    Temp    Pulse    Resp    SpO2      Last Pain:  Vitals:   03/17/21 1039  TempSrc: Oral  PainSc: 0-No pain         Complications: No notable events documented.

## 2021-03-17 NOTE — Interval H&P Note (Signed)
History and Physical Interval Note:  03/17/2021 10:51 AM  [name]  has presented today for surgery, with the diagnosis of A-FIB.  The various methods of treatment have been discussed with the patient and family. After consideration of risks, benefits and other options for treatment, the patient has consented to  Procedure(s): CARDIOVERSION (N/A) as a surgical intervention.  The patient's history has been reviewed, patient examined, no change in status, stable for surgery.  I have reviewed the patient's chart and labs.  Questions were answered to the patient's satisfaction.     Kristeen Miss

## 2021-03-17 NOTE — Discharge Instructions (Signed)

## 2021-03-17 NOTE — CV Procedure (Signed)
    Cardioversion Note  [name] [id number] December 08, 1970  Procedure: DC Cardioversion Indications: Atrial fib  Procedure Details Consent: Obtained Time Out: Verified patient identification, verified procedure, site/side was marked, verified correct patient position, special equipment/implants available, Radiology Safety Procedures followed,  medications/allergies/relevent history reviewed, required imaging and test results available.  Performed  The patient has been on adequate anticoagulation.  The patient received IV Lidocaine 100 mg followed by Propofol 120 mg IV  for sedation.  Synchronous cardioversion was performed at 200, 200, 200 joules.  The cardioversion was unsuccessful     Complications: No apparent complications Patient did tolerate procedure well.   Vesta Mixer, Montez Hageman., MD, [facility] 03/17/2021, 11:12 AM

## 2021-03-18 ENCOUNTER — Encounter: Payer: Self-pay | Admitting: Family Medicine

## 2021-03-18 DIAGNOSIS — G5620 Lesion of ulnar nerve, unspecified upper limb: Secondary | ICD-10-CM | POA: Insufficient documentation

## 2021-03-19 ENCOUNTER — Encounter (HOSPITAL_COMMUNITY): Payer: Self-pay | Admitting: Cardiovascular Disease

## 2021-03-24 ENCOUNTER — Ambulatory Visit (HOSPITAL_COMMUNITY): Payer: 59 | Admitting: Physician Assistant

## 2021-04-01 ENCOUNTER — Ambulatory Visit (HOSPITAL_COMMUNITY)
Admission: RE | Admit: 2021-04-01 | Discharge: 2021-04-01 | Disposition: A | Discharge status: Home / Self Care | Payer: 59 | Source: Ambulatory Visit | Attending: Physician Assistant | Admitting: Physician Assistant

## 2021-04-01 ENCOUNTER — Other Ambulatory Visit: Payer: Self-pay

## 2021-04-01 ENCOUNTER — Encounter (HOSPITAL_COMMUNITY): Payer: Self-pay | Admitting: Physician Assistant

## 2021-04-01 VITALS — BP 110/68 | HR 99 | Ht 75.0 in | Wt 278.8 lb

## 2021-04-01 DIAGNOSIS — E669 Obesity, unspecified: Secondary | ICD-10-CM | POA: Insufficient documentation

## 2021-04-01 DIAGNOSIS — F17210 Nicotine dependence, cigarettes, uncomplicated: Secondary | ICD-10-CM | POA: Diagnosis not present

## 2021-04-01 DIAGNOSIS — I4819 Other persistent atrial fibrillation: Secondary | ICD-10-CM | POA: Diagnosis present

## 2021-04-01 DIAGNOSIS — G4733 Obstructive sleep apnea (adult) (pediatric): Secondary | ICD-10-CM | POA: Insufficient documentation

## 2021-04-01 DIAGNOSIS — Z7901 Long term (current) use of anticoagulants: Secondary | ICD-10-CM | POA: Insufficient documentation

## 2021-04-01 DIAGNOSIS — Z6834 Body mass index (BMI) 34.0-34.9, adult: Secondary | ICD-10-CM | POA: Diagnosis not present

## 2021-04-01 DIAGNOSIS — Z79899 Other long term (current) drug therapy: Secondary | ICD-10-CM | POA: Insufficient documentation

## 2021-04-01 DIAGNOSIS — Z8249 Family history of ischemic heart disease and other diseases of the circulatory system: Secondary | ICD-10-CM | POA: Insufficient documentation

## 2021-04-01 LAB — CBC
HCT: 45.5 % (ref 39.0–52.0)
Hemoglobin: 14.8 g/dL (ref 13.0–17.0)
MCH: 28.3 pg (ref 26.0–34.0)
MCHC: 32.5 g/dL (ref 30.0–36.0)
MCV: 87 fL (ref 80.0–100.0)
Platelets: 183 10*3/uL (ref 150–400)
RBC: 5.23 MIL/uL (ref 4.22–5.81)
RDW: 13.5 % (ref 11.5–15.5)
WBC: 7.3 10*3/uL (ref 4.0–10.5)
nRBC: 0 % (ref 0.0–0.2)

## 2021-04-01 LAB — BASIC METABOLIC PANEL
Anion gap: 7 (ref 5–15)
BUN: 15 mg/dL (ref 6–20)
CO2: 26 mmol/L (ref 22–32)
Calcium: 8.7 mg/dL — ABNORMAL LOW (ref 8.9–10.3)
Chloride: 107 mmol/L (ref 98–111)
Creatinine, Ser: 1.1 mg/dL (ref 0.61–1.24)
GFR, Estimated: >60 mL/min (ref >60)
Glucose, Bld: 111 mg/dL — ABNORMAL HIGH (ref 70–99)
Potassium: 3.4 mmol/L — ABNORMAL LOW (ref 3.5–5.1)
Sodium: 140 mmol/L (ref 135–145)

## 2021-04-01 MED ORDER — FLECAINIDE ACETATE 100 MG PO TABS: 100.0000 mg | ORAL_TABLET | Freq: Two times a day (BID) | ORAL | 1 refills | Status: DC

## 2021-04-01 NOTE — Patient Instructions (Addendum)
Stop celebrex  Start Flecainide 100mg  twice a day  Cardioversion scheduled for Tuesday, July 19th  - Arrive at the July 21 and go to admitting at 730AM  - Do not eat or drink anything after midnight the night prior to your procedure.  - Take all your morning medication (except diabetic medications) with a sip of water prior to arrival.  - You will not be able to drive home after your procedure.  - Do NOT miss any doses of your blood thinner - if you should miss a dose please notify our office immediately.  - If you feel as if you go back into normal rhythm prior to scheduled cardioversion, please notify our office immediately. If your procedure is canceled in the cardioversion suite you will be charged a cancellation fee.  Patients will be asked to: to mask in public and hand hygiene (no longer quarantine) in the 3 days prior to surgery, to report if any COVID-19-like illness or household contacts to COVID-19 to determine need for testing

## 2021-04-01 NOTE — Progress Notes (Signed)
Primary Care Physician: Eustaquio Boyden, MD Primary Electrophysiologist: Dr [name] (remotely) Referring Physician: [facility]   [name] is a 50 y.o. male with a history of HLD, OSA, atrial fibrillation who presents for follow up in the [facility].  The patient was initially diagnosed with atrial fibrillation remotely and had three cardioversions in the 1990s. Seen remotely by Dr [name] and maintained on PIP flecainide. Patient has a CHADS2VASC score of 0. He presented to the ED 02/20/21 after having several days of palpitations. He tried flecainide but remained in afib. ECG at ED confirmed afib. He did not undergo DCCV as he was outside the 48 hour window. He was discharged on Eliquis and metoprolol. He does have symptoms of fatigue and intermittent dizziness while in afib.  On follow up today, he is s/p DCCV on 03/17/21 which was unsuccessful at restoring SR. He remains in afib with symptoms of fatigue. He denies any bleeding issues on anticoagulation. Of note, he is being evaluated for carpel tunnel surgery.   Today, he denies symptoms of palpitations, chest pain, shortness of breath, orthopnea, PND, lower extremity edema, presyncope, syncope, bleeding, or neurologic sequela. The patient is tolerating medications without difficulties and is otherwise without complaint today.    Atrial Fibrillation Risk Factors:  he does have symptoms or diagnosis of sleep apnea. he is compliant with CPAP therapy. he does not have a history of rheumatic fever. he does not have a history of alcohol use. The patient does have a history of early familial atrial fibrillation or other arrhythmias. Mother has afib.  he has a BMI of Body mass index is 34.85 kg/m.Marland Kitchen Filed Weights   04/01/21 1453  Weight: 126.5 kg     Family History  Problem Relation Age of Onset   Hypertension Mother    Heart attack Mother 50   Heart disease Mother    Coronary artery disease Mother 53    Coronary artery disease Father 47       48v CABG   Heart disease Father 24       CABG   Diabetes Father    Alcohol abuse Father    Liver cancer Father 70   Liver disease Other        Grandmother (P or M not specified)   Cancer Paternal Uncle        lung, leukemia   Suicidality Paternal Uncle    Hemochromatosis Cousin    Colon cancer Neg Hx    Esophageal cancer Neg Hx    Stomach cancer Neg Hx    Rectal cancer Neg Hx      Atrial Fibrillation Management history:  Previous antiarrhythmic drugs: flecainide PIP Previous cardioversions: 1996 x 2, 2008, 03/17/21 Previous ablations: none CHADS2VASC score: 0 Anticoagulation history: Eliquis   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   Colon polyps 2011   rpt Q5 yrs   DDD (degenerative disc disease) SPINE   History of kidney stones    HLD (hyperlipidemia)    Internal hemorrhoids    Kidney stone    OSA on CPAP DR CLANCE   CPAP   Paroxysmal A-fib (HCC) CARDIOLOGIST-  DR [name]-- LAST VISIT 10-22-2010 IN [facility]   controlled with flecainide 300mg  prn- this medication is out of date   Seasonal allergies    Sleep apnea    wears CPAP  Past Surgical History:  Procedure Laterality Date   ADENOIDECTOMY  AGE 21   ANTERIOR LUMBAR DISKECTOMY / DECOMPRESSION   10-08-2010   L5 - S1, LEFT SIDE ([facility])   ANTERIOR LUMBAR FUSION  07/2014   L4/5, L5/S1 [facility]   CARDIOVERSION  1996, 2008   x2 in 1996   CARDIOVERSION N/A 03/17/2021   Procedure: CARDIOVERSION;  Surgeon: Vesta Mixer, MD;  Location: [facility] ENDOSCOPY;  Service: Cardiovascular;  Laterality: N/A;   COLONOSCOPY  2011   polyp, rec rpt 5 yrs   COLONOSCOPY  12/2019   int hem, rpt 10 yrs Marina Goodell)   CT chest noncontrast  06/2011   f/u L pulm nodule - gone.   HIATAL HERNIA REPAIR  1999   HIP ARTHROSCOPY  09/23/2011   LEFT HIP [facility]    KNEE  ARTHROSCOPY WITH MEDIAL MENISECTOMY Left 08/24/2018   Procedure: LEFT KNEE ARTHROSCOPY WITH PARTIAL MEDIAL MENISECTOMY, ARTHROSCOPIC INTERNAL FIXATION MEDIAL FEMORAL CONDYLE AND MEDIAL TIBIAL PLATEAU, CHONDROPLASTY;  Surgeon: Eugenia Mcalpine, MD;  Location: [facility];  Service: Orthopedics;  Laterality: Left;   SUBOCCIPITAL CRANIECTOMY CERVICAL LAMINECTOMY  11-30-2000  DR NUDALMAN   C1 AND PARTIAL C2--   CHIARI  MALFORMATION   TOTAL KNEE ARTHROPLASTY Left 07/20/2019   Procedure: TOTAL KNEE ARTHROPLASTY;  Surgeon: Eugenia Mcalpine, MD;  Location: [facility];  Service: Orthopedics;  Laterality: Left;  with adductor canal   URETEROSCOPY  03/02/2012   Procedure: URETEROSCOPY;  Surgeon: Marcine Matar, MD;  Location: [facility];  Service: Urology;  Laterality: Left;  stone obtained    Current Outpatient Medications  Medication Sig Dispense Refill   Acetaminophen 500 MG capsule Take 1,000 mg by mouth every 6 (six) hours as needed for headache.     apixaban (ELIQUIS) 5 MG TABS tablet Take 1 tablet (5 mg total) by mouth 2 (two) times daily. 60 tablet 3   B Complex-C (SUPER B COMPLEX PO) Take 1 capsule by mouth daily.     celecoxib (CELEBREX) 200 MG capsule Take 200 mg by mouth at bedtime.     cholecalciferol (VITAMIN D3) 25 MCG (1000 UNIT) tablet Take 1,000 Units by mouth daily.     gabapentin (NEURONTIN) 400 MG capsule Take 400 mg by mouth 3 (three) times daily.     metoprolol succinate (TOPROL-XL) 25 MG 24 hr tablet Take 0.5 tablets (12.5 mg total) by mouth daily. 30 tablet 3   oxyCODONE (OXY IR/ROXICODONE) 5 MG immediate release tablet Take 5 mg by mouth 2 (two) times daily as needed for severe pain or moderate pain.     zinc gluconate 50 MG tablet Take 50 mg by mouth daily.     No current facility-administered medications for this encounter.    Allergies  Allergen Reactions   Ibuprofen Swelling    Social History   Socioeconomic History   Marital status:  Married    Spouse name: Not on file   Number of children: Not on file   Years of education: Not on file   Highest education level: Not on file  Occupational History   Not on file  Tobacco Use   Smoking status: Every Day    Packs/day: 1.00    Years: 20.00    Pack years: 20.00    Types: Cigarettes   Smokeless tobacco: Never   Tobacco comments:    pack per day 04/12/2018   Vaping Use   Vaping Use: Never used  Substance and Sexual Activity  Alcohol use: No   Drug use: No   Sexual activity: Yes  Other Topics Concern   Not on file  Social History Narrative   Caffeine: 30oz mountain dew   Lives with wife, 1 daughter, 1 step daughter, 2 dogs   Occupation: Art gallery manager for [employer]   Activity: no regular exercise, trouble with back pain   Diet: good, vegetables.   Social Determinants of Health   Financial Resource Strain: Not on file  Food Insecurity: Not on file  Transportation Needs: Not on file  Physical Activity: Not on file  Stress: Not on file  Social Connections: Not on file  Intimate Partner Violence: Not on file     ROS- All systems are reviewed and negative except as per the HPI above.  Physical Exam: Vitals:   04/01/21 1453  BP: 110/68  Pulse: 99  Weight: 126.5 kg  Height: 6\' 3"  (1.905 m)     GEN- The patient is a well appearing obese male, alert and oriented x 3 today.   HEENT-head normocephalic, atraumatic, sclera clear, conjunctiva pink, hearing intact, trachea midline. Lungs- Clear to ausculation bilaterally, normal work of breathing Heart- irregular rate and rhythm, no murmurs, rubs or gallops  GI- soft, NT, ND, + BS Extremities- no clubbing, cyanosis, or edema MS- no significant deformity or atrophy Skin- no rash or lesion Psych- euthymic mood, full affect Neuro- strength and sensation are intact   Wt Readings from Last 3 Encounters:  04/01/21 126.5 kg  03/17/21 122.5 kg  02/24/21 122 kg    EKG today  demonstrates  Afib Vent. rate 99 BPM PR interval * ms QRS duration 100 ms QT/QTcB 332/426 ms  Epic records are reviewed at length today  CHA2DS2-VASc Score = 0  The patient's score is based upon: CHF History: No HTN History: No Diabetes History: No Stroke History: No Vascular Disease History: No Age Score: 0 Gender Score: 0      ASSESSMENT AND PLAN: 1. Persistent Atrial Fibrillation The patient's CHA2DS2-VASc score is 0, indicating a 0.2% annual risk of stroke.   S/p unsuccessful DCCV on 03/17/21 We discussed therapeutic options including scheduled flecainide, dofetilide, and ablation  Will start flecainide 100 mg BID and repeat DCCV. Check echocardiogram Continue Eliquis 5 mg BID Continue Toprol 12.5 mg daily Check bmet/cbc  2. Obesity Body mass index is 34.85 kg/m. Lifestyle modification was discussed and encouraged including regular physical activity and weight reduction.  3. Obstructive sleep apnea Patient reports compliance with CPAP therapy.   Follow up in the AF clinic post DCCV then with Dr 03/19/21 as scheduled.    [name] PA-C Afib Clinic [facility] [address] [place], [place] [phone] 04/01/2021 3:02 PM

## 2021-04-07 ENCOUNTER — Encounter (HOSPITAL_COMMUNITY): Payer: Self-pay

## 2021-04-07 ENCOUNTER — Ambulatory Visit (HOSPITAL_COMMUNITY)
Admission: RE | Admit: 2021-04-07 | Discharge: 2021-04-07 | Disposition: A | Discharge status: Home / Self Care | Payer: 59 | Attending: Cardiology | Admitting: Cardiology

## 2021-04-07 ENCOUNTER — Encounter (HOSPITAL_COMMUNITY)
Admission: RE | Disposition: A | Discharge status: Home / Self Care | Payer: Self-pay | Source: Home / Self Care | Attending: Cardiology

## 2021-04-07 ENCOUNTER — Encounter (HOSPITAL_COMMUNITY): Payer: Self-pay | Admitting: Cardiology

## 2021-04-07 DIAGNOSIS — Z791 Long term (current) use of non-steroidal anti-inflammatories (NSAID): Secondary | ICD-10-CM | POA: Diagnosis not present

## 2021-04-07 DIAGNOSIS — Z6834 Body mass index (BMI) 34.0-34.9, adult: Secondary | ICD-10-CM | POA: Insufficient documentation

## 2021-04-07 DIAGNOSIS — E669 Obesity, unspecified: Secondary | ICD-10-CM | POA: Diagnosis not present

## 2021-04-07 DIAGNOSIS — Z96652 Presence of left artificial knee joint: Secondary | ICD-10-CM | POA: Insufficient documentation

## 2021-04-07 DIAGNOSIS — G4733 Obstructive sleep apnea (adult) (pediatric): Secondary | ICD-10-CM | POA: Insufficient documentation

## 2021-04-07 DIAGNOSIS — Z538 Procedure and treatment not carried out for other reasons: Secondary | ICD-10-CM | POA: Insufficient documentation

## 2021-04-07 DIAGNOSIS — F17210 Nicotine dependence, cigarettes, uncomplicated: Secondary | ICD-10-CM | POA: Diagnosis not present

## 2021-04-07 DIAGNOSIS — Z79899 Other long term (current) drug therapy: Secondary | ICD-10-CM | POA: Insufficient documentation

## 2021-04-07 DIAGNOSIS — I4819 Other persistent atrial fibrillation: Secondary | ICD-10-CM | POA: Diagnosis present

## 2021-04-07 DIAGNOSIS — Z7901 Long term (current) use of anticoagulants: Secondary | ICD-10-CM | POA: Insufficient documentation

## 2021-04-07 SURGERY — CANCELLED PROCEDURE

## 2021-04-07 NOTE — H&P (Signed)
Note          Primary Care Physician: Jacob BoydenGutierrez, Javier, MD Primary Electrophysiologist: Dr [name] (remotely) Referring Physician: [facility] Meyers     [name] is a 50 y.o. male with a history of HLD, OSA, atrial fibrillation who [facility].  The patient was initially diagnosed with atrial fibrillation remotely and had three cardioversions in the 1990s. Seen remotely by Dr [name] and maintained on PIP flecainide. Patient has a CHADS2VASC score of 0. He presented to the ED 02/20/21 after having several days of palpitations. He tried flecainide but remained in afib. ECG at ED confirmed afib. He did not undergo DCCV as he was outside the 48 hour window. He was discharged on Eliquis and metoprolol. He does have symptoms of fatigue and intermittent dizziness while in afib.   On follow up today, he is s/p DCCV on 03/17/21 which was unsuccessful at restoring SR. He remains in afib with symptoms of fatigue. He denies any bleeding issues on anticoagulation. Of note, he is being evaluated for carpel tunnel surgery.    Today, he denies symptoms of palpitations, chest pain, shortness of breath, orthopnea, PND, lower extremity edema, presyncope, syncope, bleeding, or neurologic sequela. The patient is tolerating medications without difficulties and is otherwise without complaint today.     Atrial Fibrillation Risk Factors:   he does have symptoms or diagnosis of sleep apnea. he is compliant with CPAP therapy. he does not have a history of rheumatic fever. he does not have a history of alcohol use. The patient does have a history of early familial atrial fibrillation or other arrhythmias. Mother has afib.   he has a BMI of Body mass index is 34.85 kg/m.Marland Kitchen.    Filed Weights    04/01/21 1453  Weight: 126.5 kg             Family History  Problem Relation Age of Onset   Hypertension Mother     Heart attack Mother 9665   Heart disease Mother      Coronary artery disease Mother 3365   Coronary artery disease Father 7850        796v CABG   Heart disease Father 7056        CABG   Diabetes Father     Alcohol abuse Father     Liver cancer Father 5675   Liver disease Other          Grandmother (P or M not specified)   Cancer Paternal Uncle          lung, leukemia   Suicidality Paternal Uncle     Hemochromatosis Cousin     Colon cancer Neg Hx     Esophageal cancer Neg Hx     Stomach cancer Neg Hx     Rectal cancer Neg Hx          Atrial Fibrillation Management history:   Previous antiarrhythmic drugs: flecainide PIP Previous cardioversions: 1996 x 2, 2008, 03/17/21 Previous ablations: none CHADS2VASC score: 0 Anticoagulation history: Eliquis         Past Medical History:  Diagnosis Date   Arnold-Chiari malformation (HCC) 2002    s/p decompression   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   Colon polyps 2011    rpt Q5 yrs   DDD (degenerative disc disease) SPINE   History of kidney stones     HLD (hyperlipidemia)     Internal hemorrhoids  Kidney stone     OSA on CPAP DR CLANCE    CPAP   Paroxysmal A-fib (HCC) CARDIOLOGIST-  DR Jacob Husbands-- LAST VISIT 10-22-2010 IN [facility]    controlled with flecainide 300mg  prn- this medication is out of date   Seasonal allergies     Sleep apnea      wears CPAP         Past Surgical History:  Procedure Laterality Date   ADENOIDECTOMY   AGE 29   ANTERIOR LUMBAR DISKECTOMY / DECOMPRESSION   10-08-2010    L5 - S1, LEFT SIDE ([facility])   ANTERIOR LUMBAR FUSION   07/2014    L4/5, L5/S1 [facility]   CARDIOVERSION   1996, 2008    x2 in 1996   CARDIOVERSION N/A 03/17/2021    Procedure: CARDIOVERSION;  Surgeon: 03/19/2021, MD;  Location: [facility] ENDOSCOPY;  Service: Cardiovascular;  Laterality: N/A;   COLONOSCOPY   2011    polyp, rec rpt 5 yrs   COLONOSCOPY   12/2019    int hem, rpt 10 yrs 01/2020)   CT chest noncontrast   06/2011    f/u L pulm nodule - gone.    HIATAL HERNIA REPAIR   1999   HIP ARTHROSCOPY   09/23/2011    LEFT HIP [facility]   KNEE ARTHROSCOPY WITH MEDIAL MENISECTOMY Left 08/24/2018    Procedure: LEFT KNEE ARTHROSCOPY WITH PARTIAL MEDIAL MENISECTOMY, ARTHROSCOPIC INTERNAL FIXATION MEDIAL FEMORAL CONDYLE AND MEDIAL TIBIAL PLATEAU, CHONDROPLASTY;  Surgeon: 14/01/2018, MD;  Location: [facility];  Service: Orthopedics;  Laterality: Left;   SUBOCCIPITAL CRANIECTOMY CERVICAL LAMINECTOMY   11-30-2000  DR NUDALMAN    C1 AND PARTIAL C2--   CHIARI  MALFORMATION   TOTAL KNEE ARTHROPLASTY Left 07/20/2019    Procedure: TOTAL KNEE ARTHROPLASTY;  Surgeon: 07/22/2019, MD;  Location: [facility];  Service: Orthopedics;  Laterality: Left;  with adductor canal   URETEROSCOPY   03/02/2012    Procedure: URETEROSCOPY;  Surgeon: 03/04/2012, MD;  [facility];  Service: Urology;  Laterality: Left;  stone obtained            Current Outpatient Medications  Medication Sig Dispense Refill   Acetaminophen 500 MG capsule Take 1,000 mg by mouth every 6 (six) hours as needed for headache.       apixaban (ELIQUIS) 5 MG TABS tablet Take 1 tablet (5 mg total) by mouth 2 (two) times daily. 60 tablet 3   B Complex-C (SUPER B COMPLEX PO) Take 1 capsule by mouth daily.       celecoxib (CELEBREX) 200 MG capsule Take 200 mg by mouth at bedtime.       cholecalciferol (VITAMIN D3) 25 MCG (1000 UNIT) tablet Take 1,000 Units by mouth daily.       gabapentin (NEURONTIN) 400 MG capsule Take 400 mg by mouth 3 (three) times daily.       metoprolol succinate (TOPROL-XL) 25 MG 24 hr tablet Take 0.5 tablets (12.5 mg total) by mouth daily. 30 tablet 3   oxyCODONE (OXY IR/ROXICODONE) 5 MG immediate release tablet Take 5 mg by mouth 2 (two) times daily as needed for severe pain or moderate pain.       zinc gluconate 50 MG tablet Take 50 mg by mouth daily.        No current facility-administered medications for this encounter.           Allergies  Allergen Reactions  Ibuprofen Swelling      Social History         Socioeconomic History   Marital status: Married      Spouse name: Not on file   Number of children: Not on file   Years of education: Not on file   Highest education level: Not on file  Occupational History   Not on file  Tobacco Use   Smoking status: Every Day      Packs/day: 1.00      Years: 20.00      Pack years: 20.00      Types: Cigarettes   Smokeless tobacco: Never   Tobacco comments:      pack per day 04/12/2018  Vaping Use   Vaping Use: Never used  Substance and Sexual Activity   Alcohol use: No   Drug use: No   Sexual activity: Yes  Other Topics Concern   Not on file  Social History Narrative    Caffeine: 30oz mountain dew    Lives with wife, 1 daughter, 1 step daughter, 2 dogs    Occupation: Art gallery manager for [employer]    Activity: no regular exercise, trouble with back pain    Diet: good, vegetables.    Social Determinants of Health    Financial Resource Strain: Not on file  Food Insecurity: Not on file  Transportation Needs: Not on file  Physical Activity: Not on file  Stress: Not on file  Social Connections: Not on file  Intimate Partner Violence: Not on file        ROS- All systems are reviewed and negative except as per the HPI above.   Physical Exam:    Vitals:    04/01/21 1453  BP: 110/68  Pulse: 99  Weight: 126.5 kg  Height: 6\' 3"  (1.905 m)        GEN- The patient is a well appearing obese male, alert and oriented x 3 today.   HEENT-head normocephalic, atraumatic, sclera clear, conjunctiva pink, hearing intact, trachea midline. Lungs- Clear to ausculation bilaterally, normal work of breathing Heart- irregular rate and rhythm, no murmurs, rubs or gallops GI- soft, NT, ND, + BS Extremities- no clubbing, cyanosis, or edema MS- no significant deformity or atrophy Skin- no rash or lesion Psych- euthymic mood, full  affect Neuro- strength and sensation are intact        Wt Readings from Last 3 Encounters:  04/01/21 126.5 kg  03/17/21 122.5 kg  02/24/21 122 kg      EKG today demonstrates  Afib Vent. rate 99 BPM PR interval * ms QRS duration 100 ms QT/QTcB 332/426 ms   Epic records are reviewed at length today   CHA2DS2-VASc Score = 0  The patient's score is based upon: CHF History: No HTN History: No Diabetes History: No Stroke History: No Vascular Disease History: No Age Score: 0 Gender Score: 0        ASSESSMENT AND PLAN: 1. Persistent Atrial Fibrillation The patient's CHA2DS2-VASc score is 0, indicating a 0.2% annual risk of stroke.   S/p unsuccessful DCCV on 03/17/21 We discussed therapeutic options including scheduled flecainide, dofetilide, and ablation  Will start flecainide 100 mg BID and repeat DCCV. Check echocardiogram Continue Eliquis 5 mg BID Continue Toprol 12.5 mg daily Check bmet/cbc   2. Obesity Body mass index is 34.85 kg/m. Lifestyle modification was discussed and encouraged including regular physical activity and weight reduction.   3. Obstructive sleep apnea Patient reports compliance with CPAP therapy.  Follow up in the AF clinic post DCCV then with Dr Jacob Husbands as scheduled.      Jorja Loa PA-C Afib Clinic [facility] [address] [place], [place] [address] [phone] 04/01/2021 3:02 PM    For DCCV; compliant with apixaban; no changes [name]

## 2021-04-07 NOTE — Procedures (Signed)
Electrical Cardioversion Procedure Note AUM CAGGIANO [id number] 1971/07/04  Procedure: Electrical Cardioversion Indications:  Atrial Fibrillation  Procedure Details Pt in sinus at time of arrival; procedure canceled; continue present meds.   [name] 04/07/2021, 7:57 AM

## 2021-04-07 NOTE — Interval H&P Note (Signed)
History and Physical Interval Note:  04/07/2021 7:59 AM  [name]  has presented today for surgery, with the diagnosis of AFIB.  The various methods of treatment have been discussed with the patient and family. After consideration of risks, benefits and other options for treatment, the patient has consented to  Procedure(s): CARDIOVERSION (N/A) as a surgical intervention.  The patient's history has been reviewed, patient examined, no change in status, stable for surgery.  I have reviewed the patient's chart and labs.  Questions were answered to the patient's satisfaction.     [name]

## 2021-04-07 NOTE — Progress Notes (Signed)
Patient admitted to endo for cardioversion , pt appears to be in NSR. EKG confirmed in NSR. MD Crenshaw notified, pt aware and discharged home.

## 2021-04-08 ENCOUNTER — Telehealth: Payer: Self-pay | Admitting: *Deleted

## 2021-04-08 NOTE — Telephone Encounter (Signed)
   [facility] Pre-operative Risk Assessment    Patient Name: [name]  DOB: 1971/02/13 MRN: [id number]  HEARTCARE STAFF:  - IMPORTANT!!!!!! Under Visit Info/Reason for Call, type in Other and utilize the format Clearance MM/DD/YY or Clearance TBD. Do not use dashes or single digits. - Please review there is not already an duplicate clearance open for this procedure. - If request is for dental extraction, please clarify the # of teeth to be extracted. - If the patient is currently at the dentist's office, call Pre-Op Callback Staff (MA/nurse) to input urgent request.  - If the patient is not currently in the dentist office, please route to the Pre-Op pool.  Request for surgical clearance:  What type of surgery is being performed? Left ulnar nerve decompression and anterior transposition at the elbow with flexor pronator lengthening. Left carpel tunnel release. Left anterior interosseous nerve transfer to the deep motor branch ulnar nerve. Left flexor digitorum profundus tenodesis.  When is this surgery scheduled? TBD  What type of clearance is required (medical clearance vs. Pharmacy clearance to hold med vs. Both)? both  Are there any medications that need to be held prior to surgery and how long? Eliquis- need direction  Practice name and name of physician performing surgery? [facility]  What is the office phone number? [phone]   7.   What is the office fax number? [phone]  8.   Anesthesia type (None, local, MAC, general) ? General vs block with IV sedation   Fredia Beets 04/08/2021, 1:20 PM  _________________________________________________________________   (provider comments below)

## 2021-04-10 NOTE — Telephone Encounter (Signed)
Routed surgical clearance back to the requesting surgeon's office to make them aware clearance will be addressed at the upcoming appointment 04/17/2021.

## 2021-04-10 NOTE — Telephone Encounter (Signed)
   Name: [name]  DOB: 04/02/1971  MRN: [id number]  Primary Cardiologist: None  Chart reviewed as part of pre-operative protocol coverage.  On 03/19/2021, he presented to the ED in Afib and was discharged on Eliquis and metoprolol.  He was still in Afib after DCCV 6/28 and started on flecainide 100 mg twice daily with plan for repeat DCCV. He was seen 04/07/21 and DCCV cancelled, given he was NSR. Echo recommended and scheduled for 7/29.  Because of Bardia Wangerin Roma's past medical history and time since last visit, he will require a follow-up visit in order to better assess preoperative cardiovascular risk. He already has an appointment scheduled with Alphonzo Severance, PA-C 04/17/21 and at the same time as the recommended echo above. I will add preoperative evaluation to the visit notes.  Routing to preop callback: --Please let the office know that the clearance will be determined at the time of his OV 04/17/21 and after his same day echo is resulted.   Routing to pharmacy: --As it has been 1 month since Eliquis was started, will route to pharmacy regarding their recommendations before his visit with Clint Fenton, PA-C.    Will remove from pool.  Lennon Alstrom, PA-C  04/10/2021, 2:02 PM

## 2021-04-13 NOTE — Telephone Encounter (Signed)
Patient with diagnosis of atrial fibrillation on Eliquis for anticoagulation.    Procedure: Left ulnar nerve decompression and anterior transposition at the elbow with flexor pronator lengthening. Left carpel tunnel release. Left anterior interosseous nerve transfer to the deep motor branch ulnar nerve. Left flexor digitorum profundus tenodesis. Date of procedure: TBD   CHA2DS2-VASc Score = 0  This indicates a 0.2% annual risk of stroke. The patient's score is based upon: CHF History: No HTN History: No Diabetes History: No Stroke History: No Vascular Disease History: No Age Score: 0 Gender Score: 0       CrCl 112.7 Platelet count 183  Patient with score of 0, now 26 days post DCCV, has appt with PA 7/29 to obtain final clearance, which puts him at > 1 month since procedure.   Per office protocol, patient can hold Eliquis for 2 days prior to procedure.   Patient will not need bridging with Lovenox (enoxaparin) around procedure.  A Fib clinic can determine if patient will need to continue anticoagulation or if it can be discontinued permanently

## 2021-04-13 NOTE — Telephone Encounter (Addendum)
error 

## 2021-04-17 ENCOUNTER — Ambulatory Visit (HOSPITAL_COMMUNITY)
Admission: RE | Admit: 2021-04-17 | Discharge: 2021-04-17 | Disposition: A | Discharge status: Home / Self Care | Payer: 59 | Source: Ambulatory Visit | Attending: Family Medicine | Admitting: Family Medicine

## 2021-04-17 ENCOUNTER — Encounter (HOSPITAL_COMMUNITY): Payer: Self-pay | Admitting: Physician Assistant

## 2021-04-17 ENCOUNTER — Other Ambulatory Visit: Payer: Self-pay

## 2021-04-17 ENCOUNTER — Other Ambulatory Visit (HOSPITAL_COMMUNITY): Payer: Self-pay | Admitting: *Deleted

## 2021-04-17 ENCOUNTER — Ambulatory Visit (HOSPITAL_BASED_OUTPATIENT_CLINIC_OR_DEPARTMENT_OTHER)
Admission: RE | Admit: 2021-04-17 | Discharge: 2021-04-17 | Disposition: A | Discharge status: Home / Self Care | Payer: 59 | Source: Ambulatory Visit | Attending: Physician Assistant | Admitting: Physician Assistant

## 2021-04-17 VITALS — BP 118/72 | HR 61 | Ht 75.0 in | Wt 279.8 lb

## 2021-04-17 DIAGNOSIS — E669 Obesity, unspecified: Secondary | ICD-10-CM | POA: Insufficient documentation

## 2021-04-17 DIAGNOSIS — I4819 Other persistent atrial fibrillation: Secondary | ICD-10-CM

## 2021-04-17 DIAGNOSIS — E785 Hyperlipidemia, unspecified: Secondary | ICD-10-CM | POA: Diagnosis not present

## 2021-04-17 DIAGNOSIS — I700 Atherosclerosis of aorta: Secondary | ICD-10-CM | POA: Insufficient documentation

## 2021-04-17 DIAGNOSIS — Z7901 Long term (current) use of anticoagulants: Secondary | ICD-10-CM | POA: Insufficient documentation

## 2021-04-17 DIAGNOSIS — Z79899 Other long term (current) drug therapy: Secondary | ICD-10-CM | POA: Insufficient documentation

## 2021-04-17 DIAGNOSIS — Z8249 Family history of ischemic heart disease and other diseases of the circulatory system: Secondary | ICD-10-CM | POA: Insufficient documentation

## 2021-04-17 DIAGNOSIS — G4730 Sleep apnea, unspecified: Secondary | ICD-10-CM | POA: Insufficient documentation

## 2021-04-17 DIAGNOSIS — Z6834 Body mass index (BMI) 34.0-34.9, adult: Secondary | ICD-10-CM | POA: Insufficient documentation

## 2021-04-17 DIAGNOSIS — F17210 Nicotine dependence, cigarettes, uncomplicated: Secondary | ICD-10-CM | POA: Diagnosis not present

## 2021-04-17 DIAGNOSIS — I4891 Unspecified atrial fibrillation: Secondary | ICD-10-CM | POA: Diagnosis present

## 2021-04-17 LAB — ECHOCARDIOGRAM COMPLETE
Area-P 1/2: 4.49 cm2
Calc EF: 70.7 %
S' Lateral: 3.5 cm
Single Plane A2C EF: 70.6 %
Single Plane A4C EF: 71.6 %

## 2021-04-17 MED ORDER — METOPROLOL SUCCINATE ER 25 MG PO TB24: 12.5000 mg | ORAL_TABLET | Freq: Every day | ORAL | 3 refills | Status: DC

## 2021-04-17 MED ORDER — FLECAINIDE ACETATE 100 MG PO TABS: 100.0000 mg | ORAL_TABLET | Freq: Two times a day (BID) | ORAL | 3 refills | Status: DC

## 2021-04-17 NOTE — Patient Instructions (Signed)
On August 19th you can stop Eliquis

## 2021-04-17 NOTE — Progress Notes (Signed)
  Echocardiogram 2D Echocardiogram has been performed.  Janalyn Harder 04/17/2021, 8:43 AM

## 2021-04-17 NOTE — Progress Notes (Signed)
Primary Care Physician: Eustaquio Boyden, MD Primary Electrophysiologist: Dr [name] (remotely) Referring Physician: [facility]   [name] is a 50 y.o. male with a history of HLD, OSA, atrial fibrillation who presents for follow up in the [facility].  The patient was initially diagnosed with atrial fibrillation remotely and had three cardioversions in the 1990s. Seen remotely by Dr [name] and maintained on PIP flecainide. Patient has a CHADS2VASC score of 0. He presented to the ED 02/20/21 after having several days of palpitations. He tried flecainide but remained in afib. ECG at ED confirmed afib. He did not undergo DCCV as he was outside the 48 hour window. He was discharged on Eliquis and metoprolol. He does have symptoms of fatigue and intermittent dizziness while in afib. He is s/p DCCV on 03/17/21 which was unsuccessful at restoring SR.   On follow up today, patient presented for DCCV on 04/07/21 and was found to be in SR, procedure was cancelled. Patient reports that he does have more energy in SR but is not quite back to his baseline. He denies any bleeding issues on anticoagulation.   Today, he denies symptoms of palpitations, chest pain, shortness of breath, orthopnea, PND, lower extremity edema, presyncope, syncope, bleeding, or neurologic sequela. The patient is tolerating medications without difficulties and is otherwise without complaint today.    Atrial Fibrillation Risk Factors:  he does have symptoms or diagnosis of sleep apnea. he is compliant with CPAP therapy. he does not have a history of rheumatic fever. he does not have a history of alcohol use. The patient does have a history of early familial atrial fibrillation or other arrhythmias. Mother has afib.  he has a BMI of Body mass index is 34.97 kg/m.Marland Kitchen Filed Weights   04/17/21 0845  Weight: 126.9 kg      Family History  Problem Relation Age of Onset   Hypertension Mother     Heart attack Mother 55   Heart disease Mother    Coronary artery disease Mother 27   Coronary artery disease Father 85       45v CABG   Heart disease Father 58       CABG   Diabetes Father    Alcohol abuse Father    Liver cancer Father 73   Liver disease Other        Grandmother (P or M not specified)   Cancer Paternal Uncle        lung, leukemia   Suicidality Paternal Uncle    Hemochromatosis Cousin    Colon cancer Neg Hx    Esophageal cancer Neg Hx    Stomach cancer Neg Hx    Rectal cancer Neg Hx      Atrial Fibrillation Management history:  Previous antiarrhythmic drugs: flecainide Previous cardioversions: 1996 x 2, 2008, 03/17/21 Previous ablations: none CHADS2VASC score: 0 Anticoagulation history: Eliquis   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   Colon polyps 2011   rpt Q5 yrs   DDD (degenerative disc disease) SPINE   History of kidney stones    HLD (hyperlipidemia)    Internal hemorrhoids    Kidney stone    OSA on CPAP DR CLANCE   CPAP   Paroxysmal A-fib (HCC) CARDIOLOGIST-  DR [name]-- LAST VISIT 10-22-2010 IN [facility]   controlled with flecainide 300mg  prn- this medication is  out of date   Seasonal allergies    Sleep apnea    wears CPAP   Past Surgical History:  Procedure Laterality Date   ADENOIDECTOMY  AGE 47   ANTERIOR LUMBAR DISKECTOMY / DECOMPRESSION   10-08-2010   L5 - S1, LEFT SIDE ([facility])   ANTERIOR LUMBAR FUSION  07/2014   L4/5, L5/S1 [facility]   CARDIOVERSION  1996, 2008   x2 in 1996   CARDIOVERSION N/A 03/17/2021   Procedure: CARDIOVERSION;  Surgeon: Vesta Mixer, MD;  Location: [facility] ENDOSCOPY;  Service: Cardiovascular;  Laterality: N/A;   COLONOSCOPY  2011   polyp, rec rpt 5 yrs   COLONOSCOPY  12/2019   int hem, rpt 10 yrs Marina Goodell)   CT chest noncontrast  06/2011   f/u L pulm nodule - gone.   HIATAL HERNIA  REPAIR  1999   HIP ARTHROSCOPY  09/23/2011   LEFT HIP [facility]    KNEE ARTHROSCOPY WITH MEDIAL MENISECTOMY Left 08/24/2018   Procedure: LEFT KNEE ARTHROSCOPY WITH PARTIAL MEDIAL MENISECTOMY, ARTHROSCOPIC INTERNAL FIXATION MEDIAL FEMORAL CONDYLE AND MEDIAL TIBIAL PLATEAU, CHONDROPLASTY;  Surgeon: Eugenia Mcalpine, MD;  Location: [facility];  Service: Orthopedics;  Laterality: Left;   SUBOCCIPITAL CRANIECTOMY CERVICAL LAMINECTOMY  11-30-2000  DR NUDALMAN   C1 AND PARTIAL C2--   CHIARI  MALFORMATION   TOTAL KNEE ARTHROPLASTY Left 07/20/2019   Procedure: TOTAL KNEE ARTHROPLASTY;  Surgeon: Eugenia Mcalpine, MD;  Location: [facility];  Service: Orthopedics;  Laterality: Left;  with adductor canal   URETEROSCOPY  03/02/2012   Procedure: URETEROSCOPY;  Surgeon: Marcine Matar, MD;  [facility];  Service: Urology;  Laterality: Left;  stone obtained    Current Outpatient Medications  Medication Sig Dispense Refill   Acetaminophen 500 MG capsule Take 1,000 mg by mouth every 6 (six) hours as needed for headache.     apixaban (ELIQUIS) 5 MG TABS tablet Take 1 tablet (5 mg total) by mouth 2 (two) times daily. 60 tablet 3   B Complex-C (SUPER B COMPLEX PO) Take 1 capsule by mouth daily.     cholecalciferol (VITAMIN D3) 25 MCG (1000 UNIT) tablet Take 1,000 Units by mouth daily.     flecainide (TAMBOCOR) 100 MG tablet Take 1 tablet (100 mg total) by mouth 2 (two) times daily. 60 tablet 1   gabapentin (NEURONTIN) 400 MG capsule Take 400 mg by mouth 3 (three) times daily.     metoprolol succinate (TOPROL-XL) 25 MG 24 hr tablet Take 0.5 tablets (12.5 mg total) by mouth daily. 30 tablet 3   oxyCODONE (OXY IR/ROXICODONE) 5 MG immediate release tablet Take 5 mg by mouth 2 (two) times daily as needed for severe pain or moderate pain.     zinc gluconate 50 MG tablet Take 50 mg by mouth daily.     celecoxib (CELEBREX) 200 MG capsule Take 200 mg by mouth daily. (Patient not  taking: Reported on 04/17/2021)     No current facility-administered medications for this encounter.    Allergies  Allergen Reactions   Ibuprofen Swelling    Social History   Socioeconomic History   Marital status: Married    Spouse name: Not on file   Number of children: Not on file   Years of education: Not on file   Highest education level: Not on file  Occupational History   Not on file  Tobacco Use   Smoking status: Every Day    Packs/day: 1.00  Years: 20.00    Pack years: 20.00    Types: Cigarettes   Smokeless tobacco: Never   Tobacco comments:    pack per day 04/17/21  Vaping Use   Vaping Use: Never used  Substance and Sexual Activity   Alcohol use: No   Drug use: No   Sexual activity: Yes  Other Topics Concern   Not on file  Social History Narrative   Caffeine: 30oz mountain dew   Lives with wife, 1 daughter, 1 step daughter, 2 dogs   Occupation: Art gallery manager for [employer]   Activity: no regular exercise, trouble with back pain   Diet: good, vegetables.   Social Determinants of Health   Financial Resource Strain: Not on file  Food Insecurity: Not on file  Transportation Needs: Not on file  Physical Activity: Not on file  Stress: Not on file  Social Connections: Not on file  Intimate Partner Violence: Not on file     ROS- All systems are reviewed and negative except as per the HPI above.  Physical Exam: Vitals:   04/17/21 0845  BP: 118/72  Pulse: 61  Weight: 126.9 kg  Height: 6\' 3"  (1.905 m)    GEN- The patient is a well appearing obese male, alert and oriented x 3 today.   HEENT-head normocephalic, atraumatic, sclera clear, conjunctiva pink, hearing intact, trachea midline. Lungs- Clear to ausculation bilaterally, normal work of breathing Heart- Regular rate and rhythm, no murmurs, rubs or gallops  GI- soft, NT, ND, + BS Extremities- no clubbing, cyanosis, or edema MS- no significant deformity or  atrophy Skin- no rash or lesion Psych- euthymic mood, full affect Neuro- strength and sensation are intact   Wt Readings from Last 3 Encounters:  04/17/21 126.9 kg  04/01/21 126.5 kg  03/17/21 122.5 kg    EKG today demonstrates  SR Vent. rate 61 BPM PR interval 156 ms QRS duration 106 ms QT/QTcB 392/394 ms  Epic records are reviewed at length today  CHA2DS2-VASc Score = 0  The patient's score is based upon: CHF History: No HTN History: No Diabetes History: No Stroke History: No Vascular Disease History: No Age Score: 0 Gender Score: 0      ASSESSMENT AND PLAN: 1. Persistent Atrial Fibrillation The patient's CHA2DS2-VASc score is 0, indicating a 0.2% annual risk of stroke.   S/p unsuccessful DCCV on 03/17/21, chemically converted to SR on flecainide.  Continue flecainide 100 mg BID  Echocardiogram results pending. Continue Eliquis 5 mg BID for one month post chemical conversion, then discontinue.  Continue Toprol 12.5 mg daily We discussed continuing his AAD through his upcoming surgeries and then going back to a PIP strategy.   2. Obesity Body mass index is 34.97 kg/m. Lifestyle modification was discussed and encouraged including regular physical activity and weight reduction.  3. Obstructive sleep apnea Patient reports compliance with CPAP therapy.   Follow up with Dr 03/19/21 as scheduled. Defer preoperative risk evaluation to Dr [name].    [name] PA-C [facility] [address] [place], [place] [phone] 04/17/2021 8:53 AM

## 2021-04-27 ENCOUNTER — Ambulatory Visit: Payer: 59 | Admitting: Internal Medicine

## 2021-04-27 DIAGNOSIS — I480 Paroxysmal atrial fibrillation: Secondary | ICD-10-CM

## 2021-04-30 NOTE — Telephone Encounter (Signed)
I am coming on preop box today and saw this patient's chart in CC'd box from R Fenton with afib clinic, deferring preop clearance to Dr. [name]. Patient has a follow-up appt with Dr. [name] on 05/01/21, will need to be addressed at that time; added Preop Clearance to appt notes. Will cc to Dr. [name] and his nurse as Lorain Childes - Per office protocol, the provider should assess clearance at time of office visit and should forward their finalized clearance decision to requesting party below. I will remove this message from the pre-op box.

## 2021-04-30 NOTE — Progress Notes (Signed)
Patient ID: [name], male   DOB: 1971/09/18, 50 y.o.   MRN: [id number]      Patient Care Team: Eustaquio Boyden, MD as PCP - General (Family Medicine) Jens Som Madolyn Frieze, MD as PCP - Cardiology (Cardiology)                                  <7/22 ED Visit> HPI  [name] is a 50 y.o. male seen for preoperative clearance for  orthopedic surgery  Last seen 2015.  History of atrial fibrillation which He has treated with a pill in the pocket.  6/22 palpitations persisted.  Confirmed atrial fibrillation.  Out of the 48-hour window.  Started on Eliquis and underwent cardioversion 6/22.  " Failed "   metoprolol was added and flecainide made daily.  Repeat cardioversion 7/22 scheduled.  Spontaneous conversion prior.  Treated with sleep apnea-monitored  The patient denies chest pain, shortness of breath, nocturnal dyspnea, orthopnea or peripheral edema.  There have been no palpitations, lightheadedness or syncope.    Pending surgery by Dr. 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   Colon polyps 2011   rpt Q5 yrs   DDD (degenerative disc disease) SPINE   History of kidney stones    HLD (hyperlipidemia)    Internal hemorrhoids    Kidney stone    OSA on CPAP DR CLANCE   CPAP   Paroxysmal A-fib (HCC) CARDIOLOGIST-  DR [name]-- LAST VISIT 10-22-2010 IN [facility]   controlled with flecainide 300mg  prn- this medication is out of date   Seasonal allergies    Sleep apnea    wears CPAP    Past Surgical History:  Procedure Laterality Date   ADENOIDECTOMY  AGE 52   ANTERIOR LUMBAR DISKECTOMY /  DECOMPRESSION   10-08-2010   L5 - S1, LEFT SIDE ([facility])   ANTERIOR LUMBAR FUSION  07/2014   L4/5, L5/S1 [facility]   CARDIOVERSION  1996, 2008   x2 in 1996   CARDIOVERSION N/A 03/17/2021   Procedure: CARDIOVERSION;  Surgeon: 03/19/2021, MD;  Location: [facility] ENDOSCOPY;  Service: Cardiovascular;  Laterality: N/A;   COLONOSCOPY  2011   polyp, rec rpt 5 yrs   COLONOSCOPY  12/2019   int hem, rpt 10 yrs 01/2020)   CT chest noncontrast  06/2011   f/u L pulm nodule - gone.   HIATAL HERNIA REPAIR  1999   HIP ARTHROSCOPY  09/23/2011   LEFT HIP [facility]    KNEE ARTHROSCOPY WITH MEDIAL MENISECTOMY Left 08/24/2018   Procedure: LEFT KNEE ARTHROSCOPY WITH PARTIAL MEDIAL MENISECTOMY, ARTHROSCOPIC INTERNAL FIXATION MEDIAL FEMORAL CONDYLE AND MEDIAL TIBIAL PLATEAU, CHONDROPLASTY;  Surgeon: 14/01/2018, MD;  Location: [facility];  Service: Orthopedics;  Laterality: Left;   SUBOCCIPITAL CRANIECTOMY CERVICAL LAMINECTOMY  11-30-2000  DR NUDALMAN   C1 AND PARTIAL C2--   CHIARI  MALFORMATION   TOTAL KNEE  ARTHROPLASTY Left 07/20/2019   Procedure: TOTAL KNEE ARTHROPLASTY;  Surgeon: Eugenia Mcalpine, MD;  Location: [facility];  Service: Orthopedics;  Laterality: Left;  with adductor canal   URETEROSCOPY  03/02/2012   Procedure: URETEROSCOPY;  Surgeon: Marcine Matar, MD;  Location: [facility];  Service: Urology;  Laterality: Left;  stone obtained    Current Meds  Medication Sig   Acetaminophen 500 MG capsule Take 1,000 mg by mouth every 6 (six) hours as needed for headache.   B Complex-C (SUPER B COMPLEX PO) Take 1 capsule by mouth daily.   celecoxib (CELEBREX) 200 MG capsule Take 200 mg by mouth daily.   cholecalciferol (VITAMIN D3) 25 MCG (1000 UNIT) tablet Take 1,000 Units by mouth daily.   flecainide (TAMBOCOR) 100 MG tablet Take 1 tablet (100 mg total) by mouth 2 (two) times daily.   gabapentin (NEURONTIN) 400 MG capsule Take 400 mg by mouth 3 (three)  times daily.   metoprolol succinate (TOPROL-XL) 25 MG 24 hr tablet Take 0.5 tablets (12.5 mg total) by mouth daily.   oxyCODONE (OXY IR/ROXICODONE) 5 MG immediate release tablet Take 5 mg by mouth 2 (two) times daily as needed for severe pain or moderate pain.   zinc gluconate 50 MG tablet Take 50 mg by mouth daily.    Allergies  Allergen Reactions   Ibuprofen Swelling    Review of Systems negative except from HPI and PMH  Physical Exam: BP 136/80   Pulse 62   Ht 6\' 3"  (1.905 m)   Wt 281 lb 3.2 oz (127.6 kg)   SpO2 95%   BMI 35.15 kg/m  Well developed and nourished in no acute distress HENT normal Neck supple with JVP-  flat   Lungs Clear Regular rate and rhythm, no murmurs or gallops Abd-soft with active BS No Clubbing cyanosis edema Skin-warm and dry A & Oriented  Grossly normal sensory and motor function  ECG: SINUS @ 62 13/11/40 O/w normal   Assessment and  Plan  Atrial fibrillation persistent  Preoperative assessment   Hypokalemia    Patient holding sinus rhythm.  We will continue flecainide and metoprolol from his surgery.  Thereafter we will go back to the pill in the pocket approach.  Stop his metoprolol at that time.  Further therapy for atrial fibrillation will be dictated by the frequency.  I would have a low threshold for considering catheter ablation if the frequency increases.  Last potassium level was low.  We will check it today.  Functional status is quite good.  Expected he is at acceptable CV risk for his orthopedic procedure.  Eliquis will have been discontinued in the next week or so and would continue flecainide and metoprolol through his postoperative phase probably for at least 2 weeks     Current medicines are reviewed at length with the patient today .  The patient does not  have concerns regarding medicines.

## 2021-05-01 ENCOUNTER — Encounter: Payer: Self-pay | Admitting: Internal Medicine

## 2021-05-01 ENCOUNTER — Other Ambulatory Visit: Payer: Self-pay

## 2021-05-01 ENCOUNTER — Ambulatory Visit (INDEPENDENT_AMBULATORY_CARE_PROVIDER_SITE_OTHER): Payer: 59 | Admitting: Internal Medicine

## 2021-05-01 VITALS — BP 136/80 | HR 62 | Ht 75.0 in | Wt 281.2 lb

## 2021-05-01 DIAGNOSIS — E785 Hyperlipidemia, unspecified: Secondary | ICD-10-CM

## 2021-05-01 DIAGNOSIS — E876 Hypokalemia: Secondary | ICD-10-CM

## 2021-05-01 DIAGNOSIS — I4819 Other persistent atrial fibrillation: Secondary | ICD-10-CM

## 2021-05-01 LAB — BASIC METABOLIC PANEL
BUN/Creatinine Ratio: 15 (ref 9–20)
BUN: 15 mg/dL (ref 6–24)
CO2: 25 mmol/L (ref 20–29)
Calcium: 9.4 mg/dL (ref 8.7–10.2)
Chloride: 100 mmol/L (ref 96–106)
Creatinine, Ser: 1.03 mg/dL (ref 0.76–1.27)
Glucose: 91 mg/dL (ref 65–99)
Potassium: 4.2 mmol/L (ref 3.5–5.2)
Sodium: 138 mmol/L (ref 134–144)
eGFR: 87 mL/min/{1.73_m2} (ref >59)

## 2021-05-01 NOTE — Patient Instructions (Signed)
Medication Instructions:  Your physician recommends that you continue on your current medications as directed. Please refer to the Current Medication list given to you today.  Labwork: Your physician recommends that you return for lab work in: BMP   Testing/Procedures: None ordered.  Follow-Up: Your physician recommends that you schedule a follow-up appointment in: 12 months with Dr. [name]  Any Other Special Instructions Will Be Listed Below (If Applicable).     If you need a refill on your cardiac medications before your next appointment, please call your pharmacy.

## 2021-05-04 ENCOUNTER — Other Ambulatory Visit (HOSPITAL_COMMUNITY): Payer: Self-pay

## 2021-05-04 MED ORDER — APIXABAN 5 MG PO TABS: 5.0000 mg | ORAL_TABLET | Freq: Two times a day (BID) | ORAL | 0 refills | Status: DC

## 2021-06-01 ENCOUNTER — Telehealth: Payer: Self-pay | Admitting: Pulmonary Disease

## 2021-06-01 NOTE — Telephone Encounter (Signed)
Cpap compliance report has been printed for pt. Called and spoke with pt letting him know this had been done and pt verbalized understanding. Pt said he would come by office to pick report up so report has been placed up front in file cabinet for pt. Nothing further needed.

## 2021-06-09 DIAGNOSIS — M2550 Pain in unspecified joint: Secondary | ICD-10-CM | POA: Insufficient documentation

## 2021-06-09 DIAGNOSIS — R52 Pain, unspecified: Secondary | ICD-10-CM | POA: Insufficient documentation

## 2021-07-22 ENCOUNTER — Other Ambulatory Visit (HOSPITAL_COMMUNITY): Payer: Self-pay | Admitting: *Deleted

## 2021-07-22 MED ORDER — FLECAINIDE ACETATE 100 MG PO TABS: 100.0000 mg | ORAL_TABLET | Freq: Two times a day (BID) | ORAL | 1 refills | Status: DC

## 2021-07-25 DIAGNOSIS — M533 Sacrococcygeal disorders, not elsewhere classified: Secondary | ICD-10-CM | POA: Insufficient documentation

## 2021-07-27 ENCOUNTER — Ambulatory Visit (INDEPENDENT_AMBULATORY_CARE_PROVIDER_SITE_OTHER): Payer: 59 | Admitting: Family Medicine

## 2021-07-27 ENCOUNTER — Other Ambulatory Visit: Payer: Self-pay

## 2021-07-27 ENCOUNTER — Encounter: Payer: Self-pay | Admitting: Family Medicine

## 2021-07-27 VITALS — BP 140/78 | HR 88 | Temp 97.6°F | Ht 73.5 in | Wt 271.4 lb

## 2021-07-27 DIAGNOSIS — G5622 Lesion of ulnar nerve, left upper limb: Secondary | ICD-10-CM

## 2021-07-27 DIAGNOSIS — G4733 Obstructive sleep apnea (adult) (pediatric): Secondary | ICD-10-CM

## 2021-07-27 DIAGNOSIS — Z125 Encounter for screening for malignant neoplasm of prostate: Secondary | ICD-10-CM

## 2021-07-27 DIAGNOSIS — E785 Hyperlipidemia, unspecified: Secondary | ICD-10-CM | POA: Diagnosis not present

## 2021-07-27 DIAGNOSIS — R454 Irritability and anger: Secondary | ICD-10-CM

## 2021-07-27 DIAGNOSIS — Z0001 Encounter for general adult medical examination with abnormal findings: Secondary | ICD-10-CM | POA: Insufficient documentation

## 2021-07-27 DIAGNOSIS — F17200 Nicotine dependence, unspecified, uncomplicated: Secondary | ICD-10-CM

## 2021-07-27 DIAGNOSIS — R5383 Other fatigue: Secondary | ICD-10-CM

## 2021-07-27 DIAGNOSIS — Z23 Encounter for immunization: Secondary | ICD-10-CM

## 2021-07-27 DIAGNOSIS — G8929 Other chronic pain: Secondary | ICD-10-CM

## 2021-07-27 DIAGNOSIS — G6289 Other specified polyneuropathies: Secondary | ICD-10-CM

## 2021-07-27 DIAGNOSIS — G629 Polyneuropathy, unspecified: Secondary | ICD-10-CM | POA: Insufficient documentation

## 2021-07-27 DIAGNOSIS — Z0000 Encounter for general adult medical examination without abnormal findings: Secondary | ICD-10-CM

## 2021-07-27 DIAGNOSIS — M545 Low back pain, unspecified: Secondary | ICD-10-CM

## 2021-07-27 DIAGNOSIS — I480 Paroxysmal atrial fibrillation: Secondary | ICD-10-CM

## 2021-07-27 DIAGNOSIS — Z9989 Dependence on other enabling machines and devices: Secondary | ICD-10-CM

## 2021-07-27 DIAGNOSIS — F331 Major depressive disorder, recurrent, moderate: Secondary | ICD-10-CM | POA: Insufficient documentation

## 2021-07-27 MED ORDER — BUPROPION HCL ER (SR) 100 MG PO TB12: 100.0000 mg | ORAL_TABLET | Freq: Every day | ORAL | 3 refills | Status: DC

## 2021-07-27 NOTE — Assessment & Plan Note (Signed)
Update FLP when he returns fasting ?

## 2021-07-27 NOTE — Patient Instructions (Addendum)
Flu shot today  Start shingrix vaccine today. Schedule nurse visit in 2-6 months for second shingles shot.  We will refer you for lung cancer screening program.  Try wellbutrin 100mg  daily Return at your convenience for fasting labs - schedule appointment up front.  Return in 3 months for follow up visit.   Health Maintenance, Male Adopting a healthy lifestyle and getting preventive care are important in promoting health and wellness. Ask your health care provider about: The right schedule for you to have regular tests and exams. Things you can do on your own to prevent diseases and keep yourself healthy. What should I know about diet, weight, and exercise? Eat a healthy diet  Eat a diet that includes plenty of vegetables, fruits, low-fat dairy products, and lean protein. Do not eat a lot of foods that are high in solid fats, added sugars, or sodium. Maintain a healthy weight Body mass index (BMI) is a measurement that can be used to identify possible weight problems. It estimates body fat based on height and weight. Your health care provider can help determine your BMI and help you achieve or maintain a healthy weight. Get regular exercise Get regular exercise. This is one of the most important things you can do for your health. Most adults should: Exercise for at least 150 minutes each week. The exercise should increase your heart rate and make you sweat (moderate-intensity exercise). Do strengthening exercises at least twice a week. This is in addition to the moderate-intensity exercise. Spend less time sitting. Even light physical activity can be beneficial. Watch cholesterol and blood lipids Have your blood tested for lipids and cholesterol at 52 years of age, then have this test every 5 years. You may need to have your cholesterol levels checked more often if: Your lipid or cholesterol levels are high. You are older than 52 years of age. You are at high risk for heart disease. What  should I know about cancer screening? Many types of cancers can be detected early and may often be prevented. Depending on your health history and family history, you may need to have cancer screening at various ages. This may include screening for: Colorectal cancer. Prostate cancer. Skin cancer. Lung cancer. What should I know about heart disease, diabetes, and high blood pressure? Blood pressure and heart disease High blood pressure causes heart disease and increases the risk of stroke. This is more likely to develop in people who have high blood pressure readings or are overweight. Talk with your health care provider about your target blood pressure readings. Have your blood pressure checked: Every 3-5 years if you are 25-30 years of age. Every year if you are 21 years old or older. If you are between the ages of 73 and 72 and are a current or former smoker, ask your health care provider if you should have a one-time screening for abdominal aortic aneurysm (AAA). Diabetes Have regular diabetes screenings. This checks your fasting blood sugar level. Have the screening done: Once every three years after age 19 if you are at a normal weight and have a low risk for diabetes. More often and at a younger age if you are overweight or have a high risk for diabetes. What should I know about preventing infection? Hepatitis B If you have a higher risk for hepatitis B, you should be screened for this virus. Talk with your health care provider to find out if you are at risk for hepatitis B infection. Hepatitis C Blood testing  is recommended for: Everyone born from [birth year] through 1967. Anyone with known risk factors for hepatitis C. Sexually transmitted infections (STIs) You should be screened each year for STIs, including gonorrhea and chlamydia, if: You are sexually active and are younger than 52 years of age. You are older than 52 years of age and your health care provider tells you that you are  at risk for this type of infection. Your sexual activity has changed since you were last screened, and you are at increased risk for chlamydia or gonorrhea. Ask your health care provider if you are at risk. Ask your health care provider about whether you are at high risk for HIV. Your health care provider may recommend a prescription medicine to help prevent HIV infection. If you choose to take medicine to prevent HIV, you should first get tested for HIV. You should then be tested every 3 months for as long as you are taking the medicine. Follow these instructions at home: Alcohol use Do not drink alcohol if your health care provider tells you not to drink. If you drink alcohol: Limit how much you have to 0-2 drinks a day. Know how much alcohol is in your drink. In the [place] one drink equals one 12 oz bottle of beer (355 mL), one 5 oz glass of wine (148 mL), or one 1 oz glass of hard liquor (44 mL). Lifestyle Do not use any products that contain nicotine or tobacco. These products include cigarettes, chewing tobacco, and vaping devices, such as e-cigarettes. If you need help quitting, ask your health care provider. Do not use street drugs. Do not share needles. Ask your health care provider for help if you need support or information about quitting drugs. General instructions Schedule regular health, dental, and eye exams. Stay current with your vaccines. Tell your health care provider if: You often feel depressed. You have ever been abused or do not feel safe at home. Summary Adopting a healthy lifestyle and getting preventive care are important in promoting health and wellness. Follow your health care provider's instructions about healthy diet, exercising, and getting tested or screened for diseases. Follow your health care provider's instructions on monitoring your cholesterol and blood pressure. This information is not intended to replace advice given to you by your health care provider.  Make sure you discuss any questions you have with your health care provider. Document Revised: 01/26/2021 Document Reviewed: 01/26/2021 Elsevier Patient Education  [place].

## 2021-07-27 NOTE — Assessment & Plan Note (Addendum)
Endorses h/o this.  Check CBC, TSH, B12

## 2021-07-27 NOTE — Assessment & Plan Note (Signed)
Chronic issue followed by Dr Ethelene Hal PM&R

## 2021-07-27 NOTE — Progress Notes (Signed)
Patient ID: [name], male    DOB: 27-Jul-1971, 50 y.o.   MRN: [id number]  This visit was conducted in person.  BP 140/78 (BP Location: Right Arm, Cuff Size: Large)   Pulse 88   Temp 97.6 F (36.4 C) (Temporal)   Ht 6' 1.5" (1.867 m)   Wt 271 lb 7 oz (123.1 kg)   SpO2 95%   BMI 35.33 kg/m   BP Readings from Last 3 Encounters:  07/27/21 140/78  05/01/21 136/80  04/17/21 118/72    CC: CPE Subjective:   HPI: [name] is a 50 y.o. male presenting on 07/27/2021 for Annual Exam   Parox afib followed by EP Caryl Comes) managed with metoprolol and flecainide through upcoming R arm surgery 08/2021, previously managed with pill in the pocket approach. Was on eliquis for 2 months. Cardioversion failed. Has see Dr Caryl Comes longterm - had afib at age 33you. Last seen 04/2021.   Recent carpal tunnel and ulnar nerve transposition surgery by Dr Amedeo Plenty.  Continues celebrex, gabapentin 435m tid.   Ongoing lower back pain/myofascial pain managing with steroid shots ([facility]amos). Manages pain with celebrex, tylenol, zanaflex, gabapentin and PRN oxycodone.   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 Had one this year.   Wife notes increased irritability, easy frustration.  Notes longstanding dental issues.  Chronic neuropathy.   Preventative: COLONOSCOPY 12/2019 - int hem, rpt 10 yrs ([name] Prostate cancer screening - no symptoms. No fmhx prostate cancer.  Lung cancer screening - 39PY history will refer to lung cancer screening [place].  Flu shot - yearly  COVID shot - discussed, declines  Tetanus shot - thinks has had one done recently  Pneumonia shot -  Shingrix - discussed, to consider  Advanced directive discussion - not discussed Seat belt use discussed  Sunscreen use discussed. No changing moles on skin.  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     Relevant past medical, surgical, family and social history reviewed and updated as indicated. Interim medical history since our last visit reviewed. Allergies and medications reviewed and updated. Outpatient Medications Prior to Visit  Medication Sig Dispense Refill   Acetaminophen 500 MG capsule Take 1,000 mg by mouth every 6 (six) hours as needed for headache.     B Complex-C (SUPER B COMPLEX PO) Take 1 capsule by mouth daily.     celecoxib (CELEBREX) 200 MG capsule Take 200 mg by mouth daily.     flecainide (TAMBOCOR) 100 MG tablet Take 1 tablet (100 mg total) by mouth 2 (two) times daily. 180 tablet 1   gabapentin (NEURONTIN) 400 MG capsule Take 400 mg by mouth 3 (three) times daily.     metoprolol succinate (TOPROL-XL) 25 MG 24 hr tablet Take 0.5 tablets (12.5 mg total) by mouth daily. 30 tablet 3   oxyCODONE (OXY IR/ROXICODONE) 5 MG immediate release tablet Take 5 mg by mouth 2 (two) times daily as needed for severe pain or moderate pain.     tiZANidine (ZANAFLEX) 4 MG tablet Take 4 mg by mouth 3 (three) times daily.     zinc gluconate 50 MG tablet Take 50 mg by mouth daily.     apixaban (  ELIQUIS) 5 MG TABS tablet Take 1 tablet (5 mg total) by mouth 2 (two) times daily. 28 tablet 0   cholecalciferol (VITAMIN D3) 25 MCG (1000 UNIT) tablet Take 1,000 Units by mouth daily.     No facility-administered medications prior to visit.     Per HPI unless specifically indicated in ROS section below Review of Systems  Constitutional:  Negative for activity change, appetite change, chills, fatigue, fever and unexpected weight change.  HENT:  Negative for hearing loss.   Eyes:  Negative for visual disturbance.  Respiratory:  Negative for cough, chest tightness, shortness of breath and wheezing.   Cardiovascular:  Negative for chest pain,  palpitations and leg swelling.  Gastrointestinal:  Negative for abdominal distention, abdominal pain, blood in stool, constipation, diarrhea, nausea and vomiting.  Genitourinary:  Negative for difficulty urinating and hematuria.  Musculoskeletal:  Negative for arthralgias, myalgias and neck pain.  Skin:  Negative for rash.  Neurological:  Negative for dizziness, seizures, syncope and headaches.  Hematological:  Negative for adenopathy. Does not bruise/bleed easily.  Psychiatric/Behavioral:  Negative for dysphoric mood. The patient is not nervous/anxious.    Objective:  BP 140/78 (BP Location: Right Arm, Cuff Size: Large)   Pulse 88   Temp 97.6 F (36.4 C) (Temporal)   Ht 6' 1.5" (1.867 m)   Wt 271 lb 7 oz (123.1 kg)   SpO2 95%   BMI 35.33 kg/m   Wt Readings from Last 3 Encounters:  07/27/21 271 lb 7 oz (123.1 kg)  05/01/21 281 lb 3.2 oz (127.6 kg)  04/17/21 279 lb 12.8 oz (126.9 kg)      Physical Exam Vitals and nursing note reviewed.  Constitutional:      General: He is not in acute distress.    Appearance: Normal appearance. He is well-developed. He is not ill-appearing.  HENT:     Head: Normocephalic and atraumatic.     Right Ear: Hearing, tympanic membrane, ear canal and external ear normal.     Left Ear: Hearing, tympanic membrane, ear canal and external ear normal.  Eyes:     General: No scleral icterus.    Extraocular Movements: Extraocular movements intact.     Conjunctiva/sclera: Conjunctivae normal.     Pupils: Pupils are equal, round, and reactive to light.  Neck:     Thyroid: No thyroid mass or thyromegaly.  Cardiovascular:     Rate and Rhythm: Normal rate and regular rhythm.     Pulses: Normal pulses.          Radial pulses are 2+ on the right side and 2+ on the left side.     Heart sounds: Normal heart sounds. No murmur heard. Pulmonary:     Effort: Pulmonary effort is normal. No respiratory distress.     Breath sounds: Normal breath sounds. No wheezing,  rhonchi or rales.  Abdominal:     General: Bowel sounds are normal. There is no distension.     Palpations: Abdomen is soft. There is no mass.     Tenderness: There is no abdominal tenderness. There is no guarding or rebound.     Hernia: No hernia is present.  Musculoskeletal:        General: Normal range of motion.     Cervical back: Normal range of motion and neck supple.     Right lower leg: No edema.     Left lower leg: No edema.  Lymphadenopathy:     Cervical: No cervical adenopathy.  Skin:  General: Skin is warm and dry.     Findings: No rash.  Neurological:     General: No focal deficit present.     Mental Status: He is alert and oriented to person, place, and time.  Psychiatric:        Mood and Affect: Mood normal.        Behavior: Behavior normal.        Thought Content: Thought content normal.        Judgment: Judgment normal.      Results for orders placed or performed in visit on 00/76/22  Basic metabolic panel  Result Value Ref Range   Glucose 91 65 - 99 mg/dL   BUN 15 6 - 24 mg/dL   Creatinine, Ser 1.03 0.76 - 1.27 mg/dL   eGFR 87 >59 mL/min/1.73   BUN/Creatinine Ratio 15 9 - 20   Sodium 138 134 - 144 mmol/L   Potassium 4.2 3.5 - 5.2 mmol/L   Chloride 100 96 - 106 mmol/L   CO2 25 20 - 29 mmol/L   Calcium 9.4 8.7 - 10.2 mg/dL   Depression screen [facility] 2/9 07/27/2021 07/27/2021  Decreased Interest 1 1  Down, Depressed, Hopeless 0 0  PHQ - 2 Score 1 1  Altered sleeping 1 -  Tired, decreased energy 1 -  Change in appetite 0 -  Feeling bad or failure about yourself  0 -  Trouble concentrating 1 -  Moving slowly or fidgety/restless 0 -  Suicidal thoughts 0 -  PHQ-9 Score 4 -   GAD 7 : Generalized Anxiety Score 07/27/2021  Nervous, Anxious, on Edge 1  Control/stop worrying 0  Worry too much - different things 0  Trouble relaxing 3  Restless 3  Easily annoyed or irritable 3  Afraid - awful might happen 0  Total GAD 7 Score 10   Assessment & Plan:   This visit occurred during the SARS-CoV-2 public health emergency.  Safety protocols were in place, including screening questions prior to the visit, additional usage of staff PPE, and extensive cleaning of exam room while observing appropriate contact time as indicated for disinfecting solutions.   Problem List Items Addressed This Visit     Health maintenance examination - Primary (Chronic)    Preventative protocols reviewed and updated unless pt declined. Discussed healthy diet and lifestyle.       HLD (hyperlipidemia)    Update FLP when he returns fasting.       Relevant Orders   Lipid panel   Comprehensive metabolic panel   TSH   OSA on CPAP    Continues CPAP nightly at setting of 15cmH2O, has seen pulm Dr Elsworth Soho      ATRIAL FIBRILLATION, PAROXYSMAL    Appreciate cards care. Sees Dr Caryl Comes.       Relevant Orders   CBC with Differential/Platelet   LOW BACK PAIN SYNDROME    Chronic issue followed by Dr Nelva Bush PM&R      Relevant Medications   tiZANidine (ZANAFLEX) 4 MG tablet   Smoker    Encouraged smoking cessation.  Will refer to lung cancer screening program.       Relevant Orders   Ambulatory Referral Lung Cancer Screening [facility]   Ulnar nerve compression    Recovering well after recent L CTS and ulnar nerve transposition. Discussing R side surgery 08/2021.       Relevant Medications   tiZANidine (ZANAFLEX) 4 MG tablet   buPROPion ER (WELLBUTRIN SR) 100 MG 12 hr  tablet   Irritability    Wife notices this.  Discussed possible options - would want to avoid any QT prolonging agent or anything that would increase cardiac arrhythmia risk so avoiding TCAs and celexa. Trial wellbutrin SR 161m daily.  PHQ9/GAD7 reviewed.  Also requests T checked next am labs.       Relevant Orders   Testosterone   Peripheral neuropathy    Endorses h/o this.  Check CBC, TSH, B12      Relevant Medications   tiZANidine (ZANAFLEX) 4 MG tablet   buPROPion ER  (WELLBUTRIN SR) 100 MG 12 hr tablet   Other Relevant Orders   Vitamin B12   TSH   Other Visit Diagnoses     Need for influenza vaccination       Relevant Orders   Flu Vaccine QUAD 652moM (Fluarix, Fluzone & Alfiuria Quad PF) (Completed)   Special screening for malignant neoplasm of prostate       Relevant Orders   PSA   Fatigue, unspecified type       Relevant Orders   Vitamin B12   VITAMIN D 25 Hydroxy (Vit-D Deficiency, Fractures)   Testosterone   Need for shingles vaccine       Relevant Orders   Varicella-zoster vaccine IM (Completed)        Meds ordered this encounter  Medications   buPROPion ER (WELLBUTRIN SR) 100 MG 12 hr tablet    Sig: Take 1 tablet (100 mg total) by mouth daily.    Dispense:  30 tablet    Refill:  3    Orders Placed This Encounter  Procedures   Flu Vaccine QUAD 28m628mo (Fluarix, Fluzone & Alfiuria Quad PF)   Varicella-zoster vaccine IM   Vitamin B12    Standing Status:   Future    Standing Expiration Date:   07/27/2022   VITAMIN D 25 Hydroxy (Vit-D Deficiency, Fractures)    Standing Status:   Future    Standing Expiration Date:   07/27/2022   Testosterone    Standing Status:   Future    Standing Expiration Date:   07/27/2022   Lipid panel    Standing Status:   Future    Standing Expiration Date:   07/27/2022   Comprehensive metabolic panel    Standing Status:   Future    Standing Expiration Date:   07/27/2022   TSH    Standing Status:   Future    Standing Expiration Date:   07/27/2022   PSA    Standing Status:   Future    Standing Expiration Date:   07/27/2022   CBC with Differential/Platelet    Standing Status:   Future    Standing Expiration Date:   07/27/2022   Ambulatory Referral Lung Cancer Screening [facility]    Referral Priority:   Routine    Referral Type:   Consultation    Referral Reason:   Specialty Services Required    Number of Visits Requested:   1    Patient instructions: Flu shot today  Start shingrix  vaccine today. Schedule nurse visit in 2-6 months for second shingles shot.  We will refer you for lung cancer screening program.  Try wellbutrin 100m17mily Return at your convenience for fasting labs - schedule appointment up front.  Return in 3 months for follow up visit.   Follow up plan: Return in about 3 months (around 10/27/2021) for follow up visit.  JaviRia Bush

## 2021-07-27 NOTE — Assessment & Plan Note (Addendum)
Wife notices this.  Discussed possible options - would want to avoid any QT prolonging agent or anything that would increase cardiac arrhythmia risk so avoiding TCAs and celexa. Trial wellbutrin SR 100mg  daily.  PHQ9/GAD7 reviewed.  Also requests T checked next am labs.

## 2021-07-27 NOTE — Assessment & Plan Note (Addendum)
Appreciate cards care. Sees Dr [name].

## 2021-07-27 NOTE — Assessment & Plan Note (Addendum)
Recovering well after recent L CTS and ulnar nerve transposition. Discussing R side surgery 08/2021.

## 2021-07-27 NOTE — Assessment & Plan Note (Signed)
Encouraged smoking cessation.  Will refer to lung cancer screening program.

## 2021-07-27 NOTE — Assessment & Plan Note (Addendum)
Continues CPAP nightly at setting of 15cmH2O, has seen pulm Dr Vassie Loll

## 2021-07-27 NOTE — Assessment & Plan Note (Signed)
Preventative protocols reviewed and updated unless pt declined. Discussed healthy diet and lifestyle.  

## 2021-07-29 ENCOUNTER — Other Ambulatory Visit: Payer: Self-pay

## 2021-07-29 ENCOUNTER — Other Ambulatory Visit (INDEPENDENT_AMBULATORY_CARE_PROVIDER_SITE_OTHER): Payer: 59

## 2021-07-29 DIAGNOSIS — E785 Hyperlipidemia, unspecified: Secondary | ICD-10-CM

## 2021-07-29 DIAGNOSIS — Z125 Encounter for screening for malignant neoplasm of prostate: Secondary | ICD-10-CM | POA: Diagnosis not present

## 2021-07-29 DIAGNOSIS — I480 Paroxysmal atrial fibrillation: Secondary | ICD-10-CM

## 2021-07-29 DIAGNOSIS — R5383 Other fatigue: Secondary | ICD-10-CM | POA: Diagnosis not present

## 2021-07-29 DIAGNOSIS — R454 Irritability and anger: Secondary | ICD-10-CM | POA: Diagnosis not present

## 2021-07-29 DIAGNOSIS — G6289 Other specified polyneuropathies: Secondary | ICD-10-CM

## 2021-07-29 LAB — TESTOSTERONE: Testosterone: 344.53 ng/dL (ref 300.00–890.00)

## 2021-07-29 LAB — VITAMIN B12: Vitamin B-12: 430 pg/mL (ref 211–911)

## 2021-07-29 LAB — CBC WITH DIFFERENTIAL/PLATELET
Basophils Absolute: 0 10*3/uL (ref 0.0–0.1)
Basophils Relative: 0.5 % (ref 0.0–3.0)
Eosinophils Absolute: 0.1 10*3/uL (ref 0.0–0.7)
Eosinophils Relative: 1.2 % (ref 0.0–5.0)
HCT: 45.9 % (ref 39.0–52.0)
Hemoglobin: 15.1 g/dL (ref 13.0–17.0)
Lymphocytes Relative: 17.8 % (ref 12.0–46.0)
Lymphs Abs: 1.6 10*3/uL (ref 0.7–4.0)
MCHC: 33 g/dL (ref 30.0–36.0)
MCV: 85.2 fl (ref 78.0–100.0)
Monocytes Absolute: 0.7 10*3/uL (ref 0.1–1.0)
Monocytes Relative: 7.2 % (ref 3.0–12.0)
Neutro Abs: 6.7 10*3/uL (ref 1.4–7.7)
Neutrophils Relative %: 73.3 % (ref 43.0–77.0)
Platelets: 168 10*3/uL (ref 150.0–400.0)
RBC: 5.39 Mil/uL (ref 4.22–5.81)
RDW: 14.4 % (ref 11.5–15.5)
WBC: 9.1 10*3/uL (ref 4.0–10.5)

## 2021-07-29 LAB — COMPREHENSIVE METABOLIC PANEL
ALT: 14 U/L (ref 0–53)
AST: 15 U/L (ref 0–37)
Albumin: 4.2 g/dL (ref 3.5–5.2)
Alkaline Phosphatase: 75 U/L (ref 39–117)
BUN: 18 mg/dL (ref 6–23)
CO2: 29 mEq/L (ref 19–32)
Calcium: 8.9 mg/dL (ref 8.4–10.5)
Chloride: 103 mEq/L (ref 96–112)
Creatinine, Ser: 1.03 mg/dL (ref 0.40–1.50)
GFR: 83.38 mL/min (ref >60.00)
Glucose, Bld: 97 mg/dL (ref 70–99)
Potassium: 4.4 mEq/L (ref 3.5–5.1)
Sodium: 139 mEq/L (ref 135–145)
Total Bilirubin: 0.5 mg/dL (ref 0.2–1.2)
Total Protein: 6.9 g/dL (ref 6.0–8.3)

## 2021-07-29 LAB — TSH: TSH: 1.68 u[IU]/mL (ref 0.35–5.50)

## 2021-07-29 LAB — LIPID PANEL
Cholesterol: 209 mg/dL — ABNORMAL HIGH (ref 0–200)
HDL: 36.1 mg/dL — ABNORMAL LOW (ref >39.00)
LDL Cholesterol: 135 mg/dL — ABNORMAL HIGH (ref 0–99)
NonHDL: 172.8
Total CHOL/HDL Ratio: 6
Triglycerides: 187 mg/dL — ABNORMAL HIGH (ref 0.0–149.0)
VLDL: 37.4 mg/dL (ref 0.0–40.0)

## 2021-07-29 LAB — VITAMIN D 25 HYDROXY (VIT D DEFICIENCY, FRACTURES): VITD: 30.43 ng/mL (ref 30.00–100.00)

## 2021-07-29 LAB — PSA: PSA: 0.61 ng/mL (ref 0.10–4.00)

## 2021-11-24 ENCOUNTER — Other Ambulatory Visit: Payer: Self-pay | Admitting: Family Medicine

## 2021-11-26 NOTE — Telephone Encounter (Signed)
E-scribed refill.  Plz schedule OV for f/u (was due around 10/27/21). ?

## 2021-12-14 ENCOUNTER — Other Ambulatory Visit (HOSPITAL_COMMUNITY): Payer: Self-pay | Admitting: Physician Assistant

## 2021-12-21 ENCOUNTER — Other Ambulatory Visit: Payer: Self-pay | Admitting: Family Medicine

## 2021-12-23 NOTE — Telephone Encounter (Signed)
Refill request Bupropion ?Last office visit 07/27/21 was to follow-up ?No upcoming appointment scheduled ?Last refill 11/26/21 #30 ?See drug warning with Metoprolol ?

## 2021-12-24 NOTE — Telephone Encounter (Signed)
ERx 

## 2022-02-01 ENCOUNTER — Ambulatory Visit: Payer: 59 | Admitting: Family Medicine

## 2022-02-01 ENCOUNTER — Encounter: Payer: Self-pay | Admitting: Family Medicine

## 2022-02-01 VITALS — BP 140/70 | HR 72 | Temp 98.2°F | Ht 73.5 in | Wt 286.5 lb

## 2022-02-01 DIAGNOSIS — Z23 Encounter for immunization: Secondary | ICD-10-CM | POA: Diagnosis not present

## 2022-02-01 DIAGNOSIS — M5126 Other intervertebral disc displacement, lumbar region: Secondary | ICD-10-CM

## 2022-02-01 DIAGNOSIS — M545 Low back pain, unspecified: Secondary | ICD-10-CM

## 2022-02-01 DIAGNOSIS — R454 Irritability and anger: Secondary | ICD-10-CM

## 2022-02-01 DIAGNOSIS — F17200 Nicotine dependence, unspecified, uncomplicated: Secondary | ICD-10-CM

## 2022-02-01 DIAGNOSIS — G8929 Other chronic pain: Secondary | ICD-10-CM

## 2022-02-01 DIAGNOSIS — I480 Paroxysmal atrial fibrillation: Secondary | ICD-10-CM

## 2022-02-01 MED ORDER — NORTRIPTYLINE HCL 25 MG PO CAPS: 25.0000 mg | ORAL_CAPSULE | Freq: Every day | ORAL | 6 refills | Status: DC

## 2022-02-01 MED ORDER — BUPROPION HCL ER (SR) 100 MG PO TB12: 100.0000 mg | ORAL_TABLET | Freq: Every day | ORAL | 6 refills | Status: DC

## 2022-02-01 NOTE — Progress Notes (Signed)
? ? Patient ID: [name], male    DOB: 09/19/1971, 51 y.o.   MRN: [id number] ? ?This visit was conducted in person. ? ?BP 140/70   Pulse 72   Temp 98.2 ?F (36.8 ?C) (Temporal)   Ht 6' 1.5" (1.867 m)   Wt 286 lb 8 oz (130 kg)   SpO2 93%   BMI 37.29 kg/m?   ? ?CC: discuss mood ?Subjective:  ? ?HPI: ?[name] is a 51 y.o. male presenting on 02/01/2022 for Irritabilty (Here for f/u.) ? ? ?Notes predominant situational irritability over limitations in activity due to CTS and chronic lower back pain. He continues taking celebrex 100mg , zanaflex, gabapentin 400mg  TID, and PRN oxycodone 5mg . Feels quality of life significantly affected.  ? ?Feels the best early morning, with slowly progressive back pain over the course of the day.  ? ?H/o arnold chiari malformation.  ?H/o back surgery, L knee replacement, L hip replacement, ulnar nerve and CTS surgeries bilaterally. ? ?Per prior OV: ?Wife notices increased irritability and easy frustration.  ?Discussed possible options - would want to avoid any QT prolonging agent or anything that would increase cardiac arrhythmia risk so avoiding TCAs and celexa. Trial wellbutrin SR 100mg  daily.  ? ?H/o afib followed by EP Caryl Comes) s/p unsuccessful cardioversion x1. Converted on flecainide, now off blood thinner.  ? ?Continued 1 ppd smoker.  ?   ? ?Relevant past medical, surgical, family and social history reviewed and updated as indicated. Interim medical history since our last visit reviewed. ?Allergies and medications reviewed and updated. ?Outpatient Medications Prior to Visit  ?Medication Sig Dispense Refill  ? Acetaminophen 500 MG capsule Take 1,000 mg by mouth every 6 (six) hours as needed for headache.    ? celecoxib (CELEBREX) 200 MG capsule Take 200 mg by mouth daily.    ? flecainide (TAMBOCOR) 100 MG tablet TAKE 1 TABLET TWICE A DAY 180 tablet 1  ? gabapentin (NEURONTIN) 400 MG capsule Take 400 mg by mouth 3 (three) times daily.    ? metoprolol succinate (TOPROL-XL)  25 MG 24 hr tablet Take 0.5 tablets (12.5 mg total) by mouth daily. 30 tablet 3  ? oxyCODONE (OXY IR/ROXICODONE) 5 MG immediate release tablet Take 5 mg by mouth 2 (two) times daily as needed for severe pain or moderate pain.    ? buPROPion ER (WELLBUTRIN SR) 100 MG 12 hr tablet Take 1 tablet by mouth once daily 30 tablet 3  ? tiZANidine (ZANAFLEX) 4 MG tablet Take 4 mg by mouth 3 (three) times daily.    ? B Complex-C (SUPER B COMPLEX PO) Take 1 capsule by mouth daily.    ? zinc gluconate 50 MG tablet Take 50 mg by mouth daily.    ? ?No facility-administered medications prior to visit.  ?  ? ?Per HPI unless specifically indicated in ROS section below ?Review of Systems ? ?Objective:  ?BP 140/70   Pulse 72   Temp 98.2 ?F (36.8 ?C) (Temporal)   Ht 6' 1.5" (1.867 m)   Wt 286 lb 8 oz (130 kg)   SpO2 93%   BMI 37.29 kg/m?   ?Wt Readings from Last 3 Encounters:  ?02/01/22 286 lb 8 oz (130 kg)  ?07/27/21 271 lb 7 oz (123.1 kg)  ?05/01/21 281 lb 3.2 oz (127.6 kg)  ?  ?  ?Physical Exam ?Vitals and nursing note reviewed.  ?Constitutional:   ?   Appearance: Normal appearance. He is not ill-appearing.  ?HENT:  ?  Mouth/Throat:  ?   Mouth: Mucous membranes are moist.  ?   Pharynx: Oropharynx is clear. No oropharyngeal exudate or posterior oropharyngeal erythema.  ?Eyes:  ?   Pupils: Pupils are equal, round, and reactive to light.  ?Cardiovascular:  ?   Rate and Rhythm: Normal rate and regular rhythm.  ?   Pulses: Normal pulses.  ?   Heart sounds: Normal heart sounds. No murmur heard. ?Pulmonary:  ?   Effort: Pulmonary effort is normal. No respiratory distress.  ?   Breath sounds: Normal breath sounds. No wheezing, rhonchi or rales.  ?Musculoskeletal:  ?   Right lower leg: No edema.  ?   Left lower leg: No edema.  ?Skin: ?   General: Skin is warm and dry.  ?   Findings: No rash.  ?Neurological:  ?   Mental Status: He is alert.  ?Psychiatric:     ?   Mood and Affect: Mood normal.     ?   Behavior: Behavior normal.  ? ?    ?Results for orders placed or performed in visit on 07/29/21  ?CBC with Differential/Platelet  ?Result Value Ref Range  ? WBC 9.1 4.0 - 10.5 K/uL  ? RBC 5.39 4.22 - 5.81 Mil/uL  ? Hemoglobin 15.1 13.0 - 17.0 g/dL  ? HCT 45.9 39.0 - 52.0 %  ? MCV 85.2 78.0 - 100.0 fl  ? MCHC 33.0 30.0 - 36.0 g/dL  ? RDW 14.4 11.5 - 15.5 %  ? Platelets 168.0 150.0 - 400.0 K/uL  ? Neutrophils Relative % 73.3 43.0 - 77.0 %  ? Lymphocytes Relative 17.8 12.0 - 46.0 %  ? Monocytes Relative 7.2 3.0 - 12.0 %  ? Eosinophils Relative 1.2 0.0 - 5.0 %  ? Basophils Relative 0.5 0.0 - 3.0 %  ? Neutro Abs 6.7 1.4 - 7.7 K/uL  ? Lymphs Abs 1.6 0.7 - 4.0 K/uL  ? Monocytes Absolute 0.7 0.1 - 1.0 K/uL  ? Eosinophils Absolute 0.1 0.0 - 0.7 K/uL  ? Basophils Absolute 0.0 0.0 - 0.1 K/uL  ?PSA  ?Result Value Ref Range  ? PSA 0.61 0.10 - 4.00 ng/mL  ?TSH  ?Result Value Ref Range  ? TSH 1.68 0.35 - 5.50 uIU/mL  ?Comprehensive metabolic panel  ?Result Value Ref Range  ? Sodium 139 135 - 145 mEq/L  ? Potassium 4.4 3.5 - 5.1 mEq/L  ? Chloride 103 96 - 112 mEq/L  ? CO2 29 19 - 32 mEq/L  ? Glucose, Bld 97 70 - 99 mg/dL  ? BUN 18 6 - 23 mg/dL  ? Creatinine, Ser 1.03 0.40 - 1.50 mg/dL  ? Total Bilirubin 0.5 0.2 - 1.2 mg/dL  ? Alkaline Phosphatase 75 39 - 117 U/L  ? AST 15 0 - 37 U/L  ? ALT 14 0 - 53 U/L  ? Total Protein 6.9 6.0 - 8.3 g/dL  ? Albumin 4.2 3.5 - 5.2 g/dL  ? GFR 83.38 >60.00 mL/min  ? Calcium 8.9 8.4 - 10.5 mg/dL  ?Lipid panel  ?Result Value Ref Range  ? Cholesterol 209 (H) 0 - 200 mg/dL  ? Triglycerides 187.0 (H) 0.0 - 149.0 mg/dL  ? HDL 36.10 (L) >39.00 mg/dL  ? VLDL 37.4 0.0 - 40.0 mg/dL  ? LDL Cholesterol 135 (H) 0 - 99 mg/dL  ? Total CHOL/HDL Ratio 6   ? NonHDL 172.80   ?Testosterone  ?Result Value Ref Range  ? Testosterone 344.53 300.00 - 890.00 ng/dL  ?VITAMIN D 25 Hydroxy (Vit-D Deficiency,  Fractures)  ?Result Value Ref Range  ? VITD 30.43 30.00 - 100.00 ng/mL  ?Vitamin B12  ?Result Value Ref Range  ? Vitamin B-12 430 211 - 911 pg/mL  ? ? ?   02/01/2022  ?  8:41 AM 07/27/2021  ?  3:26 PM 07/27/2021  ?  2:15 PM  ?Depression screen PHQ 2/9  ?Decreased Interest 2 1 1   ?Down, Depressed, Hopeless 0 0 0  ?PHQ - 2 Score 2 1 1   ?Altered sleeping 3 1   ?Tired, decreased energy 2 1   ?Change in appetite 0 0   ?Feeling bad or failure about yourself  0 0   ?Trouble concentrating 1 1   ?Moving slowly or fidgety/restless 0 0   ?Suicidal thoughts 0 0   ?PHQ-9 Score 8 4   ?Difficult doing work/chores Not difficult at all    ?  ? ?  02/01/2022  ?  8:42 AM 07/27/2021  ?  3:27 PM  ?GAD 7 : Generalized Anxiety Score  ?Nervous, Anxious, on Edge 0 1  ?Control/stop worrying 0 0  ?Worry too much - different things 0 0  ?Trouble relaxing 3 3  ?Restless 3 3  ?Easily annoyed or irritable 3 3  ?Afraid - awful might happen 0 0  ?Total GAD 7 Score 9 10  ? ?Assessment & Plan:  ? ?Problem List Items Addressed This Visit   ? ? ATRIAL FIBRILLATION, PAROXYSMAL  ?  Stable period on flecainide and toprol XL.  ? ?  ?  ? LOW BACK PAIN SYNDROME  ?  Ongoing despite gabapentin, celebrex, oxycodone PRN.  ?Add nortriptyline 25mg  nightly, reassess effect.  ? ?  ?  ? DEGENERATIVE DISC DISEASE, LUMBOSACRAL SPINE W/RADICULOPATHY  ? Smoker  ?  Continued 1 ppd smoker, precontemplative.  ? ?  ?  ? Irritability - Primary  ?  Some improvement in irritability since starting wellbutrin, also may note improvement in attention.  ?Largely situational mood changes due to physical limitations stemming from chronic back pain and hand/arm symptoms.  ?See above - will add nortriptyline at night time. Reviewed side effects to watch for including dry mouth. Update with effect. Reassess at CPE in 6 months.  ? ?  ?  ? ?Other Visit Diagnoses   ? ? Need for shingles vaccine      ? Relevant Orders  ? Varicella-zoster vaccine IM (Completed)  ? ?  ?  ? ?Meds ordered this encounter  ?Medications  ? nortriptyline (PAMELOR) 25 MG capsule  ?  Sig: Take 1 capsule (25 mg total) by mouth at bedtime.  ?  Dispense:  30 capsule  ?  Refill:   6  ? buPROPion ER (WELLBUTRIN SR) 100 MG 12 hr tablet  ?  Sig: Take 1 tablet (100 mg total) by mouth daily.  ?  Dispense:  30 tablet  ?  Refill:  6  ?  NEEDS TO BE SEEN FOR FURTHER REFILLS  ? ?Orders Placed This Enc

## 2022-02-01 NOTE — Assessment & Plan Note (Signed)
Stable period on flecainide and toprol XL.  ?

## 2022-02-01 NOTE — Assessment & Plan Note (Signed)
Ongoing despite gabapentin, celebrex, oxycodone PRN.  ?Add nortriptyline 25mg  nightly, reassess effect.  ?

## 2022-02-01 NOTE — Assessment & Plan Note (Signed)
Some improvement in irritability since starting wellbutrin, also may note improvement in attention.  ?Largely situational mood changes due to physical limitations stemming from chronic back pain and hand/arm symptoms.  ?See above - will add nortriptyline at night time. Reviewed side effects to watch for including dry mouth. Update with effect. Reassess at CPE in 6 months.  ?

## 2022-02-01 NOTE — Assessment & Plan Note (Signed)
Continued 1 ppd smoker, precontemplative.  ?

## 2022-02-01 NOTE — Patient Instructions (Addendum)
2nd shingrix vaccine today  ?Continue wellbutrin, start nortriptyline 25mg  nightly. This will hopefully help with back pain and sleep.  ?Update [place] with how you're doing on these medicines.  ?Return in 6 months for physical.  ?

## 2022-02-24 ENCOUNTER — Telehealth: Payer: Self-pay | Admitting: Family Medicine

## 2022-02-24 MED ORDER — NORTRIPTYLINE HCL 50 MG PO CAPS: 50.0000 mg | ORAL_CAPSULE | Freq: Every day | ORAL | 6 refills | Status: DC

## 2022-02-24 NOTE — Telephone Encounter (Signed)
Pt called and wanted to know f he can get an increase nortriptyline (PAMELOR) 25 MG capsule to 50mg , He stated after he started the medication it helped him a lot for about 4 days and then it gradually went away. He said he feels close to the same way when he first started. Please advise. Call back is [phone]

## 2022-02-24 NOTE — Telephone Encounter (Signed)
Ok to do this - I've sent in to [facility] pharmacy higher dose 50mg 

## 2022-02-25 NOTE — Telephone Encounter (Signed)
Spoke with pt relaying Dr. Timoteo Expose message.  Pt verbalizes understanding and expresses his thanks. He will let Dr. Reece Agar know how he does with the higher dose.

## 2022-05-09 ENCOUNTER — Other Ambulatory Visit (HOSPITAL_COMMUNITY): Payer: Self-pay | Admitting: Internal Medicine

## 2022-05-28 ENCOUNTER — Telehealth: Payer: Self-pay | Admitting: Pulmonary Disease

## 2022-05-28 NOTE — Telephone Encounter (Signed)
Pt is requesting a compliance report for cpap from [employer] for work purposes. Would like to pick up in office.   Call back [phone]

## 2022-05-31 NOTE — Telephone Encounter (Signed)
Download of pt's cpap data has been printed for pt to come by to pick up. Called and spoke with pt letting him know that I had printed this and stated to pt to ask for me when he came to the office and I would then come out to the lobby to give it to him and he verbalized understanding. Nothing further needed.

## 2022-06-01 ENCOUNTER — Telehealth: Payer: Self-pay | Admitting: Family Medicine

## 2022-06-01 MED ORDER — NORTRIPTYLINE HCL 75 MG PO CAPS: 75.0000 mg | ORAL_CAPSULE | Freq: Every day | ORAL | 6 refills | Status: DC

## 2022-06-01 NOTE — Telephone Encounter (Signed)
Spoke to patient by telephone and was advised that he has been taking Nortriptyline 50 mg now for a while and feels that his body has just gotten use to it. Patient wants to know if it can be increased to 75 mg now. Patient stated that the back pain has come back. Patient stated that he feels that the Nortriptyline needs to be increased more. Patient stated that he is leaving to go out of town Thursday and will be gone next week. Pharmacy [address]

## 2022-06-01 NOTE — Telephone Encounter (Signed)
Pt called in requesting to increase RX nortriptyline (PAMELOR) 50 MG capsule stated pain has come back . Stated only in town until Thursday no appointment available . Please advise [phone]

## 2022-06-01 NOTE — Telephone Encounter (Signed)
Reasonable to increase dose - sent to pharmacy. Update [place] with effect.  Watch for side effects of sedation, dry mouth, increased appetite.

## 2022-06-01 NOTE — Telephone Encounter (Signed)
Patient notified as instructed by telephone and verbalized understanding. 

## 2022-06-03 ENCOUNTER — Other Ambulatory Visit (HOSPITAL_COMMUNITY): Payer: Self-pay | Admitting: Internal Medicine

## 2022-06-16 ENCOUNTER — Other Ambulatory Visit (HOSPITAL_COMMUNITY): Payer: Self-pay | Admitting: Internal Medicine

## 2022-07-02 ENCOUNTER — Telehealth: Payer: Self-pay | Admitting: Internal Medicine

## 2022-07-02 MED ORDER — FLECAINIDE ACETATE 100 MG PO TABS: 100.0000 mg | ORAL_TABLET | Freq: Two times a day (BID) | ORAL | 0 refills | Status: DC

## 2022-07-02 NOTE — Telephone Encounter (Signed)
Pt's medication was sent to pt's pharmacy as requested. Confirmation received.  °

## 2022-07-02 NOTE — Telephone Encounter (Signed)
*  STAT* If patient is at the pharmacy, call can be transferred to refill team.   1. Which medications need to be refilled? (please list name of each medication and dose if known)   flecainide (TAMBOCOR) 100 MG tablet  2. Which pharmacy/location (including street and city if local pharmacy) is medication to be sent to?  [facility] [place], [place] to [facility]  3. Do they need a 30 day or 90 day supply?  90 day  Patient has 15 days worth of this medication.

## 2022-07-02 NOTE — Addendum Note (Signed)
Addended by: Carter Kitten D on: 07/02/2022 11:31 AM   Modules accepted: Orders

## 2022-07-22 NOTE — Progress Notes (Signed)
PCP:  Eustaquio Boyden, MD Primary Cardiologist: [name], MD Electrophysiologist: Sherryl Manges, MD   [name] is a 51 y.o. male seen today for Sherryl Manges, MD for routine electrophysiology followup. Since last being seen in our clinic the patient reports doing very well. He feels like he had one brief episode of palpitations in the past 8 months. Lasted less than 10 minutes.  he denies chest pain, dyspnea, PND, orthopnea, nausea, vomiting, dizziness, syncope, edema, weight gain, or early satiety.   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   Colon polyps 2011   rpt Q5 yrs   DDD (degenerative disc disease) SPINE   History of kidney stones    HLD (hyperlipidemia)    Internal hemorrhoids    Kidney stone    [facility] on CPAP DR CLANCE   CPAP   Paroxysmal A-fib (HCC) CARDIOLOGIST-  DR [name]-- LAST VISIT 10-22-2010 IN [facility]   controlled with flecainide 300mg  prn- this medication is out of date   Seasonal allergies    Sleep apnea    wears CPAP   Past Surgical History:  Procedure Laterality Date   ADENOIDECTOMY  AGE 1   ANTERIOR LUMBAR DISKECTOMY / DECOMPRESSION   10/08/2010   L5 - S1, LEFT SIDE ([facility])   ANTERIOR LUMBAR FUSION  07/2014   L4/5, L5/S1 [facility]   CARDIOVERSION  1996, 2008   x2 in 1996   CARDIOVERSION N/A 03/17/2021   Procedure: CARDIOVERSION;  Surgeon: Vesta Mixer, MD;  [facility];  Service: Cardiovascular;  Laterality: N/A;   CARPAL TUNNEL RELEASE Bilateral 2022   first left then right (Dr Amanda Pea)   COLONOSCOPY  2011   polyp, rec rpt 5 yrs   COLONOSCOPY  12/2019   int hem, rpt 10 yrs Marina Goodell)   CT chest noncontrast  06/2011   f/u L pulm nodule - gone.   HIATAL HERNIA REPAIR  1999   HIP ARTHROSCOPY  09/23/2011   LEFT HIP [facility]    KNEE ARTHROSCOPY WITH MEDIAL MENISECTOMY Left 08/24/2018   Procedure: LEFT  KNEE ARTHROSCOPY WITH PARTIAL MEDIAL MENISECTOMY, ARTHROSCOPIC INTERNAL FIXATION MEDIAL FEMORAL CONDYLE AND MEDIAL TIBIAL PLATEAU, CHONDROPLASTY;  Surgeon: Eugenia Mcalpine, MD;  Location: [facility];  Service: Orthopedics;  Laterality: Left;   SUBOCCIPITAL CRANIECTOMY CERVICAL LAMINECTOMY  11-30-2000  DR NUDALMAN   C1 AND PARTIAL C2--   CHIARI  MALFORMATION   TOTAL KNEE ARTHROPLASTY Left 07/20/2019   Procedure: TOTAL KNEE ARTHROPLASTY;  Surgeon: Eugenia Mcalpine, MD;  Location: [facility];  Service: Orthopedics;  Laterality: Left;  with adductor canal   ULNAR NERVE TRANSPOSITION Bilateral 2022   left then right (Dr Amanda Pea)   URETEROSCOPY  03/02/2012   Procedure: URETEROSCOPY;  Surgeon: Marcine Matar, MD;  Location: [facility];  Service: Urology;  Laterality: Left;  stone obtained    Current Outpatient Medications  Medication Sig Dispense Refill   Acetaminophen 500 MG capsule Take 1,000 mg by mouth every 6 (six) hours as needed for headache.     buPROPion ER (WELLBUTRIN SR) 150 MG 12 hr tablet Take 1 tablet (150 mg total) by mouth daily. 90 tablet 3   celecoxib (CELEBREX) 200 MG capsule Take 200 mg by mouth daily.     flecainide (TAMBOCOR) 100 MG tablet Take 1 tablet (100 mg total) by mouth 2 (two) times daily.  30 tablet 0   gabapentin (NEURONTIN) 400 MG capsule Take 400 mg by mouth 3 (three) times daily.     nortriptyline (PAMELOR) 25 MG capsule Take 2 capsules (50 mg total) by mouth at bedtime for 14 days, THEN 1 capsule (25 mg total) at bedtime for 14 days. Then stop. 42 capsule 0   oxyCODONE (OXY IR/ROXICODONE) 5 MG immediate release tablet Take 5 mg by mouth 2 (two) times daily as needed for severe pain or moderate pain.     metoprolol succinate (TOPROL-XL) 25 MG 24 hr tablet Take 0.5 tablets (12.5 mg total) by mouth daily. (Patient not taking: Reported on 07/27/2022) 30 tablet 3   No current facility-administered medications for this visit.    Allergies   Allergen Reactions   Ibuprofen Swelling    Social History   Socioeconomic History   Marital status: Married    Spouse name: Not on file   Number of children: Not on file   Years of education: Not on file   Highest education level: Not on file  Occupational History   Not on file  Tobacco Use   Smoking status: Every Day    Packs/day: 1.00    Years: 20.00    Total pack years: 20.00    Types: Cigarettes   Smokeless tobacco: Never   Tobacco comments:    pack per day 04/17/21  Vaping Use   Vaping Use: Never used  Substance and Sexual Activity   Alcohol use: No   Drug use: No   Sexual activity: Yes  Other Topics Concern   Not on file  Social History Narrative   Caffeine: 30oz mountain dew   Lives with wife, 1 daughter, 1 step daughter, 2 dogs   Occupation: Art gallery manager for [employer]   Activity: no regular exercise, trouble with back pain   Diet: good, vegetables.   Social Determinants of Health   Financial Resource Strain: Not on file  Food Insecurity: Not on file  Transportation Needs: Not on file  Physical Activity: Not on file  Stress: Not on file  Social Connections: Not on file  Intimate Partner Violence: Not on file   Review of Systems: All other systems reviewed and are otherwise negative except as noted above.  Physical Exam: Vitals:   07/27/22 0805  BP: (!) 148/74  Pulse: 99  SpO2: 98%  Weight: (!) 311 lb (141.1 kg)  Height: 6' 1.5" (1.867 m)    GEN- The patient is well appearing, alert and oriented x 3 today.   HEENT: normocephalic, atraumatic; sclera clear, conjunctiva pink; hearing intact; oropharynx clear; neck supple, no JVP Lymph- no cervical lymphadenopathy Lungs- Clear to ausculation bilaterally, normal work of breathing.  No wheezes, rales, rhonchi Heart- Regular rate and rhythm, no murmurs, rubs or gallops, PMI not laterally displaced GI- soft, non-tender, non-distended, bowel sounds present, no  hepatosplenomegaly Extremities- No peripheral edema. no clubbing or cyanosis; DP/PT/radial pulses 2+ bilaterally MS- no significant deformity or atrophy Skin- warm and dry, no rash or lesion Psych- euthymic mood, full affect Neuro- strength and sensation are intact  EKG is ordered. Personal review of EKG from today shows NSR at 99 bpm, stable intervals  Additional studies reviewed include: Previous EP notes.   ECHO COMPLETE WO IMAGING ENHANCING AGENT 04/17/2021 1. Left ventricular ejection fraction, by estimation, is 60 to 65%. Left ventricular ejection fraction by 3D volume is 65 %. The left ventricle has normal function. The left ventricle has no regional wall motion abnormalities.  Left ventricular diastolic parameters were normal. 2. Right ventricular systolic function is normal. The right ventricular size is normal. Tricuspid regurgitation signal is inadequate for assessing PA pressure. 3. The mitral valve is grossly normal. No evidence of mitral valve regurgitation. No evidence of mitral stenosis. 4. The aortic valve is tricuspid. There is mild calcification of the aortic valve. Aortic valve regurgitation is not visualized. No aortic stenosis is present. 5. The inferior vena cava is normal in size with greater than 50% respiratory variability, suggesting right atrial pressure of 3 mmHg.   Assessment and Plan:  1. Persistent AF EKG today shows NSR Continue flecainide 100 mg BID Add back Toprol 25 since he is taking daily.   2. HTN Elevated on arrival and recheck.  Adding back Toprol.   Follow up with Dr. Caryl Comes in 6 months  Shirley Friar, PA-C  07/27/22 8:15 AM

## 2022-07-26 ENCOUNTER — Ambulatory Visit (INDEPENDENT_AMBULATORY_CARE_PROVIDER_SITE_OTHER): Payer: 59 | Admitting: Family Medicine

## 2022-07-26 ENCOUNTER — Encounter: Payer: Self-pay | Admitting: Family Medicine

## 2022-07-26 VITALS — BP 144/74 | HR 102 | Temp 97.9°F | Ht 73.5 in | Wt 308.6 lb

## 2022-07-26 DIAGNOSIS — Z0000 Encounter for general adult medical examination without abnormal findings: Secondary | ICD-10-CM

## 2022-07-26 DIAGNOSIS — R454 Irritability and anger: Secondary | ICD-10-CM

## 2022-07-26 DIAGNOSIS — G6289 Other specified polyneuropathies: Secondary | ICD-10-CM

## 2022-07-26 DIAGNOSIS — I480 Paroxysmal atrial fibrillation: Secondary | ICD-10-CM

## 2022-07-26 DIAGNOSIS — M546 Pain in thoracic spine: Secondary | ICD-10-CM

## 2022-07-26 DIAGNOSIS — Z0001 Encounter for general adult medical examination with abnormal findings: Secondary | ICD-10-CM | POA: Diagnosis not present

## 2022-07-26 DIAGNOSIS — Z125 Encounter for screening for malignant neoplasm of prostate: Secondary | ICD-10-CM

## 2022-07-26 DIAGNOSIS — E785 Hyperlipidemia, unspecified: Secondary | ICD-10-CM | POA: Diagnosis not present

## 2022-07-26 DIAGNOSIS — Z23 Encounter for immunization: Secondary | ICD-10-CM | POA: Diagnosis not present

## 2022-07-26 DIAGNOSIS — G8929 Other chronic pain: Secondary | ICD-10-CM

## 2022-07-26 DIAGNOSIS — M5441 Lumbago with sciatica, right side: Secondary | ICD-10-CM

## 2022-07-26 DIAGNOSIS — F17200 Nicotine dependence, unspecified, uncomplicated: Secondary | ICD-10-CM

## 2022-07-26 DIAGNOSIS — M5126 Other intervertebral disc displacement, lumbar region: Secondary | ICD-10-CM

## 2022-07-26 DIAGNOSIS — Z8249 Family history of ischemic heart disease and other diseases of the circulatory system: Secondary | ICD-10-CM

## 2022-07-26 DIAGNOSIS — G4733 Obstructive sleep apnea (adult) (pediatric): Secondary | ICD-10-CM

## 2022-07-26 MED ORDER — BUPROPION HCL ER (SR) 150 MG PO TB12: 150.0000 mg | ORAL_TABLET | Freq: Every day | ORAL | 3 refills | Status: DC

## 2022-07-26 MED ORDER — NORTRIPTYLINE HCL 25 MG PO CAPS: ORAL_CAPSULE | ORAL | 0 refills | Status: DC

## 2022-07-26 NOTE — Assessment & Plan Note (Signed)
Preventative protocols reviewed and updated unless pt declined. Discussed healthy diet and lifestyle.  

## 2022-07-26 NOTE — Patient Instructions (Addendum)
Flu shot today Labs today  We will refer you to lung cancer screening.  Increase wellbutrin to 150mg  SR daily in the morning.  Drop nortriptyline to 50mg  nightly for 2 weeks then 25mg  nightly for 2 weeks then stop. If worsening pain off this, let me know.  Good to see you today Return as needed or in 3-4 months for follow up visit  Health Maintenance, Male Adopting a healthy lifestyle and getting preventive care are important in promoting health and wellness. Ask your health care provider about: The right schedule for you to have regular tests and exams. Things you can do on your own to prevent diseases and keep yourself healthy. What should I know about diet, weight, and exercise? Eat a healthy diet  Eat a diet that includes plenty of vegetables, fruits, low-fat dairy products, and lean protein. Do not eat a lot of foods that are high in solid fats, added sugars, or sodium. Maintain a healthy weight Body mass index (BMI) is a measurement that can be used to identify possible weight problems. It estimates body fat based on height and weight. Your health care provider can help determine your BMI and help you achieve or maintain a healthy weight. Get regular exercise Get regular exercise. This is one of the most important things you can do for your health. Most adults should: Exercise for at least 150 minutes each week. The exercise should increase your heart rate and make you sweat (moderate-intensity exercise). Do strengthening exercises at least twice a week. This is in addition to the moderate-intensity exercise. Spend less time sitting. Even light physical activity can be beneficial. Watch cholesterol and blood lipids Have your blood tested for lipids and cholesterol at 53 years of age, then have this test every 5 years. You may need to have your cholesterol levels checked more often if: Your lipid or cholesterol levels are high. You are older than 53 years of age. You are at high  risk for heart disease. What should I know about cancer screening? Many types of cancers can be detected early and may often be prevented. Depending on your health history and family history, you may need to have cancer screening at various ages. This may include screening for: Colorectal cancer. Prostate cancer. Skin cancer. Lung cancer. What should I know about heart disease, diabetes, and high blood pressure? Blood pressure and heart disease High blood pressure causes heart disease and increases the risk of stroke. This is more likely to develop in people who have high blood pressure readings or are overweight. Talk with your health care provider about your target blood pressure readings. Have your blood pressure checked: Every 3-5 years if you are 6-47 years of age. Every year if you are 88 years old or older. If you are between the ages of 35 and 59 and are a current or former smoker, ask your health care provider if you should have a one-time screening for abdominal aortic aneurysm (AAA). Diabetes Have regular diabetes screenings. This checks your fasting blood sugar level. Have the screening done: Once every three years after age 50 if you are at a normal weight and have a low risk for diabetes. More often and at a younger age if you are overweight or have a high risk for diabetes. What should I know about preventing infection? Hepatitis B If you have a higher risk for hepatitis B, you should be screened for this virus. Talk with your health care provider to find out if you  are at risk for hepatitis B infection. Hepatitis C Blood testing is recommended for: Everyone born from [birth year] through 1967. Anyone with known risk factors for hepatitis C. Sexually transmitted infections (STIs) You should be screened each year for STIs, including gonorrhea and chlamydia, if: You are sexually active and are younger than 53 years of age. You are older than 53 years of age and your health care  provider tells you that you are at risk for this type of infection. Your sexual activity has changed since you were last screened, and you are at increased risk for chlamydia or gonorrhea. Ask your health care provider if you are at risk. Ask your health care provider about whether you are at high risk for HIV. Your health care provider may recommend a prescription medicine to help prevent HIV infection. If you choose to take medicine to prevent HIV, you should first get tested for HIV. You should then be tested every 3 months for as long as you are taking the medicine. Follow these instructions at home: Alcohol use Do not drink alcohol if your health care provider tells you not to drink. If you drink alcohol: Limit how much you have to 0-2 drinks a day. Know how much alcohol is in your drink. In the [place] one drink equals one 12 oz bottle of beer (355 mL), one 5 oz glass of wine (148 mL), or one 1 oz glass of hard liquor (44 mL). Lifestyle Do not use any products that contain nicotine or tobacco. These products include cigarettes, chewing tobacco, and vaping devices, such as e-cigarettes. If you need help quitting, ask your health care provider. Do not use street drugs. Do not share needles. Ask your health care provider for help if you need support or information about quitting drugs. General instructions Schedule regular health, dental, and eye exams. Stay current with your vaccines. Tell your health care provider if: You often feel depressed. You have ever been abused or do not feel safe at home. Summary Adopting a healthy lifestyle and getting preventive care are important in promoting health and wellness. Follow your health care provider's instructions about healthy diet, exercising, and getting tested or screened for diseases. Follow your health care provider's instructions on monitoring your cholesterol and blood pressure. This information is not intended to replace advice given to  you by your health care provider. Make sure you discuss any questions you have with your health care provider. Document Revised: 01/26/2021 Document Reviewed: 01/26/2021 Elsevier Patient Education  [place].

## 2022-07-26 NOTE — Progress Notes (Addendum)
Patient ID: [name], male    DOB: [birth year], 51 y.o.   MRN: [id number]  This visit was conducted in person.  BP (!) 144/74 (BP Location: Right Arm, Cuff Size: Large)   Pulse (!) 102   Temp 97.9 F (36.6 C) (Temporal)   Ht 6' 1.5" (1.867 m)   Wt (!) 308 lb 9.6 oz (140 kg)   SpO2 96%   BMI 40.16 kg/m   BP Readings from Last 3 Encounters:  07/26/22 (!) 144/74  02/01/22 140/70  07/27/21 140/78   CC: CPE Subjective:   HPI: [name] is a 51 y.o. male presenting on 07/26/2022 for Annual Exam   Parox afib followed by EP [name]) on metoprolol PRN, ran out of med. Continues flecainide 100mg  bid. Was on eliquis for 2 months. Cardioversion failed. Has seen Dr [name] longterm - had afib at age 49yo, upcoming appt tomorrow.   H/o carpal tunnel and ulnar nerve transposition surgery by Dr Amanda Pea.    Ongoing lower back pain/myofascial pain managing with steroid shots ([place]). Manages pain with celebrex, tylenol, zanaflex, gabapentin and PRN oxycodone rarely.   Mood difficulty - managed with nortriptyline 25mg  daily - has titrated up to 75mg  nightly over the past 6 months with only limited improvement. Significant weight gain noted in interim.   Worsening pain - worse in the morning, endorses am stiffness that lasts about 30 minutes. Then feels well from 8am to 1pm, then acute worsening of chronic joint pains again after 1pm.   Regularly sees PM&R s/p multiple steroid injections. Saw Dr Ethelene Hal today.  Chronic R radiculopathy that starts in lower back and can travel down to lateral right foot, sometimes L leg pain as well. + numbness of both feet.  Continues celebrex 200mg  daily, as well as gabapentin 400mg  TID.  Known chronic neuropathy - asks about neurology referral.   Had a fall down steps of beach house several weeks ago, several near falls. Feels muscle and generalized weakness.   No fevers/chills, rash.   Preventative: COLONOSCOPY 12/2019 - int hem, rpt 10 yrs  [name]) Prostate cancer screening - no symptoms. No fmhx prostate cancer.  Lung cancer screening - 35 PY history - will refer to lung cancer screening CT. did not get scheduled last year.  Flu shot - yearly  COVID shot - discussed, declines  Tetanus shot - thinks has had one relatively recently Pneumonia shot -  Shingrix - 07/2021, 01/2022 Advanced directive discussion -  Seat belt use discussed  Sunscreen use discussed. No changing moles on skin.  Smoking - 1 ppd since age 22 yo Alcohol  - no Dentist - q3 mo Eye exam - q1-2 yrs  Caffeine: 30oz mountain dew  Lives with wife, 1 daughter, 1 step daughter, 2 dogs  Occupation: Art gallery manager for [employer]  Activity: no regular exercise, trouble with back pain  Diet: good, vegetables.     Relevant past medical, surgical, family and social history reviewed and updated as indicated. Interim medical history since our last visit reviewed. Allergies and medications reviewed and updated. Outpatient Medications Prior to Visit  Medication Sig Dispense Refill   Acetaminophen 500 MG capsule Take 1,000 mg by mouth every 6 (six) hours as needed for headache.     celecoxib (CELEBREX) 200 MG capsule Take 200 mg by mouth daily.     flecainide (TAMBOCOR) 100 MG tablet Take 1 tablet (100 mg total) by mouth 2 (two) times daily. 30 tablet 0  gabapentin (NEURONTIN) 400 MG capsule Take 400 mg by mouth 3 (three) times daily.     oxyCODONE (OXY IR/ROXICODONE) 5 MG immediate release tablet Take 5 mg by mouth 2 (two) times daily as needed for severe pain or moderate pain.     buPROPion ER (WELLBUTRIN SR) 100 MG 12 hr tablet Take 1 tablet (100 mg total) by mouth daily. 30 tablet 6   nortriptyline (PAMELOR) 75 MG capsule Take 1 capsule (75 mg total) by mouth at bedtime. 30 capsule 6   metoprolol succinate (TOPROL-XL) 25 MG 24 hr tablet Take 0.5 tablets (12.5 mg total) by mouth daily. (Patient not taking: Reported on 07/26/2022) 30 tablet 3    No facility-administered medications prior to visit.     Per HPI unless specifically indicated in ROS section below Review of Systems  Constitutional:  Negative for activity change, appetite change, chills, fatigue, fever and unexpected weight change.  HENT:  Negative for hearing loss.   Eyes:  Negative for visual disturbance.  Respiratory:  Negative for cough, chest tightness, shortness of breath and wheezing.   Cardiovascular:  Positive for leg swelling (ankles). Negative for chest pain and palpitations.  Gastrointestinal:  Negative for abdominal distention, abdominal pain, blood in stool, constipation, diarrhea, nausea and vomiting.  Genitourinary:  Negative for difficulty urinating and hematuria.  Musculoskeletal:  Negative for arthralgias, myalgias and neck pain.       Left hand swelling  Skin:  Negative for rash.  Neurological:  Negative for dizziness, seizures, syncope and headaches.  Hematological:  Negative for adenopathy. Does not bruise/bleed easily.  Psychiatric/Behavioral:  Positive for dysphoric mood. The patient is not nervous/anxious.     Objective:  BP (!) 144/74 (BP Location: Right Arm, Cuff Size: Large)   Pulse (!) 102   Temp 97.9 F (36.6 C) (Temporal)   Ht 6' 1.5" (1.867 m)   Wt (!) 308 lb 9.6 oz (140 kg)   SpO2 96%   BMI 40.16 kg/m   Wt Readings from Last 3 Encounters:  07/26/22 (!) 308 lb 9.6 oz (140 kg)  02/01/22 286 lb 8 oz (130 kg)  07/27/21 271 lb 7 oz (123.1 kg)      Physical Exam Vitals and nursing note reviewed.  Constitutional:      General: He is not in acute distress.    Appearance: Normal appearance. He is well-developed. He is obese. He is not ill-appearing.  HENT:     Head: Normocephalic and atraumatic.     Right Ear: Hearing, tympanic membrane, ear canal and external ear normal.     Left Ear: Hearing, tympanic membrane, ear canal and external ear normal.     Nose: Nose normal.     Mouth/Throat:     Mouth: Mucous membranes are  moist.     Pharynx: Oropharynx is clear. No oropharyngeal exudate or posterior oropharyngeal erythema.  Eyes:     General: No scleral icterus.    Extraocular Movements: Extraocular movements intact.     Conjunctiva/sclera: Conjunctivae normal.     Pupils: Pupils are equal, round, and reactive to light.  Neck:     Thyroid: No thyroid mass or thyromegaly.  Cardiovascular:     Rate and Rhythm: Normal rate and regular rhythm.     Pulses: Normal pulses.          Radial pulses are 2+ on the right side and 2+ on the left side.     Heart sounds: Normal heart sounds. No murmur heard. Pulmonary:  Effort: Pulmonary effort is normal. No respiratory distress.     Breath sounds: Normal breath sounds. No wheezing, rhonchi or rales.  Abdominal:     General: Bowel sounds are normal. There is no distension.     Palpations: Abdomen is soft. There is no mass.     Tenderness: There is no abdominal tenderness. There is no guarding or rebound.     Hernia: No hernia is present.  Musculoskeletal:        General: Normal range of motion.     Cervical back: Normal range of motion and neck supple.     Right lower leg: No edema.     Left lower leg: No edema.  Lymphadenopathy:     Cervical: No cervical adenopathy.  Skin:    General: Skin is warm and dry.     Findings: No rash.  Neurological:     General: No focal deficit present.     Mental Status: He is alert and oriented to person, place, and time.     Comments: 5/5 strength BUE, BLE, grip strength intact  Psychiatric:        Mood and Affect: Mood normal.        Behavior: Behavior normal.        Thought Content: Thought content normal.        Judgment: Judgment normal.          07/26/2022    4:04 PM 02/01/2022    8:41 AM 07/27/2021    3:26 PM 07/27/2021    2:15 PM  Depression screen PHQ 2/9  Decreased Interest 0 2 1 1   Down, Depressed, Hopeless 0 0 0 0  PHQ - 2 Score 0 2 1 1   Altered sleeping  3 1   Tired, decreased energy  2 1   Change in  appetite  0 0   Feeling bad or failure about yourself   0 0   Trouble concentrating  1 1   Moving slowly or fidgety/restless  0 0   Suicidal thoughts  0 0   PHQ-9 Score  8 4   Difficult doing work/chores  Not difficult at all         02/01/2022    8:42 AM 07/27/2021    3:27 PM  GAD 7 : Generalized Anxiety Score  Nervous, Anxious, on Edge 0 1  Control/stop worrying 0 0  Worry too much - different things 0 0  Trouble relaxing 3 3  Restless 3 3  Easily annoyed or irritable 3 3  Afraid - awful might happen 0 0  Total GAD 7 Score 9 10   Assessment & Plan:   Problem List Items Addressed This Visit     Encounter for general adult medical examination with abnormal findings - Primary (Chronic)    Preventative protocols reviewed and updated unless pt declined. Discussed healthy diet and lifestyle.       HLD (hyperlipidemia)    Update FLP off statin. Baseline CPK today.  The 10-year ASCVD risk score (Arnett DK, et al., 2019) is: 15.9%   Values used to calculate the score:     Age: 62 years     Sex: Male     Is Non-Hispanic African American: No     Diabetic: No     Tobacco smoker: Yes     Systolic Blood Pressure: 144 mmHg     Is BP treated: No     HDL Cholesterol: 36.1 mg/dL     Total Cholesterol: 209 mg/dL  Relevant Orders   Lipid panel   Comprehensive metabolic panel   TSH   OSA on CPAP   ATRIAL FIBRILLATION, PAROXYSMAL    Appreciate cards care, upcoming appt tomorrow.  Continues flecainide, but ran out of Toprol XL.      Relevant Orders   CBC with Differential/Platelet   BACK PAIN, THORACIC REGION   Chronic low back pain with sciatica    Sees Dr Ethelene Hal PMR, s/p recent steroid injection. Continues gabapentin, celebrex, PRN oxycodone.  Nortriptyline started 01/2022 with initial improvement however no significant benefit despite titrating dose to 75mg  nightly - and now with significant weight gain noted. Will taper down over the next month and reassess pain control.  If worsened pain, consider further titration.  RTC 3 mo f/u visit       Relevant Medications   buPROPion ER (WELLBUTRIN SR) 150 MG 12 hr tablet   nortriptyline (PAMELOR) 25 MG capsule   DEGENERATIVE DISC DISEASE, LUMBOSACRAL SPINE W/RADICULOPATHY   Smoker    Continued smoker. Refer for lung cancer screening.       Relevant Orders   Ambulatory Referral for Lung Cancer Scre   Family history of premature CAD    Not on statin. Pending FLP.       Irritability    Poorly controlled depressed mood - increase wellbutrin SR to 150mg  once daily, update with effect.       Peripheral neuropathy    Presumed stemming from known lumbar degenerative disease.  He continues gabapentin 400mg  TID. Will taper off TCA as per above. No significant alcohol use, no DM hx, B12 levels normal.  Consider further neurological evaluation.       Relevant Medications   buPROPion ER (WELLBUTRIN SR) 150 MG 12 hr tablet   nortriptyline (PAMELOR) 25 MG capsule   Other Relevant Orders   Sedimentation rate   CK   Other Visit Diagnoses     Special screening for malignant neoplasm of prostate       Relevant Orders   PSA   Need for influenza vaccination       Relevant Orders   Flu Vaccine QUAD 43mo+IM (Fluarix, Fluzone & Alfiuria Quad PF) (Completed)        Meds ordered this encounter  Medications   buPROPion ER (WELLBUTRIN SR) 150 MG 12 hr tablet    Sig: Take 1 tablet (150 mg total) by mouth daily.    Dispense:  90 tablet    Refill:  3   nortriptyline (PAMELOR) 25 MG capsule    Sig: Take 2 capsules (50 mg total) by mouth at bedtime for 14 days, THEN 1 capsule (25 mg total) at bedtime for 14 days. Then stop.    Dispense:  42 capsule    Refill:  0    Note new dose   Orders Placed This Encounter  Procedures   Flu Vaccine QUAD 22mo+IM (Fluarix, Fluzone & Alfiuria Quad PF)   Lipid panel   Comprehensive metabolic panel   TSH   CBC with Differential/Platelet   PSA   Sedimentation rate   CK    Ambulatory Referral for Lung Cancer Scre    Referral Priority:   Routine    Referral Type:   Consultation    Referral Reason:   Specialty Services Required    Number of Visits Requested:   1    Patient instructions: Flu shot today Labs today  We will refer you to lung cancer screening.  Increase wellbutrin to 150mg  SR daily in the  morning.  Drop nortriptyline to 50mg  nightly for 2 weeks then 25mg  nightly for 2 weeks then stop. If worsening pain off this, let me know.  Good to see you today Return as needed or in 3-4 months for follow up visit  Follow up plan: Return in about 3 months (around 10/26/2022), or if symptoms worsen or fail to improve, for follow up visit.  Eustaquio Boyden, MD

## 2022-07-27 ENCOUNTER — Encounter: Payer: Self-pay | Admitting: Student

## 2022-07-27 ENCOUNTER — Ambulatory Visit: Payer: 59 | Attending: Student | Admitting: Student

## 2022-07-27 VITALS — BP 148/74 | HR 99 | Ht 73.5 in | Wt 311.0 lb

## 2022-07-27 DIAGNOSIS — I4819 Other persistent atrial fibrillation: Secondary | ICD-10-CM | POA: Diagnosis not present

## 2022-07-27 DIAGNOSIS — I10 Essential (primary) hypertension: Secondary | ICD-10-CM | POA: Diagnosis not present

## 2022-07-27 LAB — CBC WITH DIFFERENTIAL/PLATELET
Basophils Absolute: 0.1 10*3/uL (ref 0.0–0.1)
Basophils Relative: 1.4 % (ref 0.0–3.0)
Eosinophils Absolute: 0.3 10*3/uL (ref 0.0–0.7)
Eosinophils Relative: 2.9 % (ref 0.0–5.0)
HCT: 43.9 % (ref 39.0–52.0)
Hemoglobin: 14.9 g/dL (ref 13.0–17.0)
Lymphocytes Relative: 30.8 % (ref 12.0–46.0)
Lymphs Abs: 3.3 10*3/uL (ref 0.7–4.0)
MCHC: 34 g/dL (ref 30.0–36.0)
MCV: 86.6 fl (ref 78.0–100.0)
Monocytes Absolute: 0.8 10*3/uL (ref 0.1–1.0)
Monocytes Relative: 7.3 % (ref 3.0–12.0)
Neutro Abs: 6.1 10*3/uL (ref 1.4–7.7)
Neutrophils Relative %: 57.6 % (ref 43.0–77.0)
Platelets: 229 10*3/uL (ref 150.0–400.0)
RBC: 5.07 Mil/uL (ref 4.22–5.81)
RDW: 14.1 % (ref 11.5–15.5)
WBC: 10.7 10*3/uL — ABNORMAL HIGH (ref 4.0–10.5)

## 2022-07-27 LAB — COMPREHENSIVE METABOLIC PANEL
ALT: 12 U/L (ref 0–53)
AST: 13 U/L (ref 0–37)
Albumin: 4.1 g/dL (ref 3.5–5.2)
Alkaline Phosphatase: 74 U/L (ref 39–117)
BUN: 19 mg/dL (ref 6–23)
CO2: 30 mEq/L (ref 19–32)
Calcium: 9 mg/dL (ref 8.4–10.5)
Chloride: 99 mEq/L (ref 96–112)
Creatinine, Ser: 1.38 mg/dL (ref 0.40–1.50)
GFR: 58.29 mL/min — ABNORMAL LOW (ref >60.00)
Glucose, Bld: 101 mg/dL — ABNORMAL HIGH (ref 70–99)
Potassium: 4.1 mEq/L (ref 3.5–5.1)
Sodium: 137 mEq/L (ref 135–145)
Total Bilirubin: 0.2 mg/dL (ref 0.2–1.2)
Total Protein: 7.2 g/dL (ref 6.0–8.3)

## 2022-07-27 LAB — LIPID PANEL
Cholesterol: 236 mg/dL — ABNORMAL HIGH (ref 0–200)
HDL: 41.3 mg/dL (ref >39.00)
Total CHOL/HDL Ratio: 6
Triglycerides: 435 mg/dL — ABNORMAL HIGH (ref 0.0–149.0)

## 2022-07-27 LAB — TSH: TSH: 1.35 u[IU]/mL (ref 0.35–5.50)

## 2022-07-27 LAB — CK: Total CK: 107 U/L (ref 7–232)

## 2022-07-27 LAB — LDL CHOLESTEROL, DIRECT: Direct LDL: 144 mg/dL

## 2022-07-27 LAB — PSA: PSA: 0.51 ng/mL (ref 0.10–4.00)

## 2022-07-27 LAB — SEDIMENTATION RATE: Sed Rate: 16 mm/hr (ref 0–20)

## 2022-07-27 MED ORDER — METOPROLOL SUCCINATE ER 25 MG PO TB24: 25.0000 mg | ORAL_TABLET | Freq: Every day | ORAL | 3 refills | Status: DC

## 2022-07-27 NOTE — Patient Instructions (Signed)
Medication Instructions:  Your physician has recommended you make the following change in your medication:   START: Metoprolol Succinate 25mg  daily at bedtime  *If you need a refill on your cardiac medications before your next appointment, please call your pharmacy*   Lab Work: None If you have labs (blood work) drawn today and your tests are completely normal, you will receive your results only by: [place] (if you have MyChart) OR A paper copy in the mail If you have any lab test that is abnormal or we need to change your treatment, we will call you to review the results.   Follow-Up: At [facility], you and your health needs are our priority.  As part of our continuing mission to provide you with exceptional heart care, we have created designated Provider Care Teams.  These Care Teams include your primary Cardiologist (physician) and Advanced Practice Providers (APPs -  Physician Assistants and Nurse Practitioners) who all work together to provide you with the care you need, when you need it.   Your next appointment:   6 month(s)  The format for your next appointment:   In Person  Provider:   Virl Axe, MD   Important Information About Sugar

## 2022-07-27 NOTE — Assessment & Plan Note (Signed)
Continued smoker. Refer for lung cancer screening.

## 2022-07-27 NOTE — Assessment & Plan Note (Addendum)
Sees Dr Nelva Bush PMR, s/p recent steroid injection. Continues gabapentin, celebrex, PRN oxycodone.  Nortriptyline started 01/2022 with initial improvement however no significant benefit despite titrating dose to 75mg  nightly - and now with significant weight gain noted. Will taper down over the next month and reassess pain control. If worsened pain, consider further titration.  RTC 3 mo f/u visit

## 2022-07-27 NOTE — Assessment & Plan Note (Addendum)
Update FLP off statin. Baseline CPK today.  The 10-year ASCVD risk score (Arnett DK, et al., 2019) is: 15.9%   Values used to calculate the score:     Age: 51 years     Sex: Male     Is Non-Hispanic African American: No     Diabetic: No     Tobacco smoker: Yes     Systolic Blood Pressure: 623 mmHg     Is BP treated: No     HDL Cholesterol: 36.1 mg/dL     Total Cholesterol: 209 mg/dL

## 2022-07-27 NOTE — Assessment & Plan Note (Signed)
Poorly controlled depressed mood - increase wellbutrin SR to 150mg  once daily, update with effect.

## 2022-07-27 NOTE — Assessment & Plan Note (Signed)
Not on statin. Pending FLP.

## 2022-07-27 NOTE — Assessment & Plan Note (Signed)
Presumed stemming from known lumbar degenerative disease.  He continues gabapentin 400mg  TID. Will taper off TCA as per above. No significant alcohol use, no DM hx, B12 levels normal.  Consider further neurological evaluation.

## 2022-07-27 NOTE — Assessment & Plan Note (Signed)
Appreciate cards care, upcoming appt tomorrow.  Continues flecainide, but ran out of Toprol XL.

## 2022-10-01 ENCOUNTER — Ambulatory Visit: Payer: 59 | Admitting: Nurse Practitioner

## 2022-10-01 ENCOUNTER — Encounter: Payer: Self-pay | Admitting: Nurse Practitioner

## 2022-10-01 VITALS — BP 132/78 | HR 76 | Ht 73.5 in | Wt 305.0 lb

## 2022-10-01 DIAGNOSIS — F17210 Nicotine dependence, cigarettes, uncomplicated: Secondary | ICD-10-CM | POA: Diagnosis not present

## 2022-10-01 DIAGNOSIS — F17200 Nicotine dependence, unspecified, uncomplicated: Secondary | ICD-10-CM

## 2022-10-01 DIAGNOSIS — J40 Bronchitis, not specified as acute or chronic: Secondary | ICD-10-CM

## 2022-10-01 DIAGNOSIS — R052 Subacute cough: Secondary | ICD-10-CM | POA: Insufficient documentation

## 2022-10-01 MED ORDER — AZITHROMYCIN 250 MG PO TABS: ORAL_TABLET | ORAL | 0 refills | Status: AC

## 2022-10-01 MED ORDER — FLUTICASONE PROPIONATE 50 MCG/ACT NA SUSP: 2.0000 | Freq: Every day | NASAL | 0 refills | Status: DC

## 2022-10-01 MED ORDER — LEVOCETIRIZINE DIHYDROCHLORIDE 5 MG PO TABS: 5.0000 mg | ORAL_TABLET | Freq: Every evening | ORAL | 0 refills | Status: DC

## 2022-10-01 NOTE — Assessment & Plan Note (Signed)
Given length of illness comorbidities including smoking and being febrile with negative COVID test will treat with azithromycin for atypical infection/bronchitis.  Follow-up if no improvement

## 2022-10-01 NOTE — Patient Instructions (Signed)
Nice to see you today I have sent in 3 medications. An allergy pill, nasal spray, and antibiotic. Follow up if no improvement

## 2022-10-01 NOTE — Assessment & Plan Note (Signed)
Still smoking

## 2022-10-01 NOTE — Progress Notes (Signed)
Acute Office Visit  Subjective:     Patient ID: [name], male    DOB: 1971/04/18, 52 y.o.   MRN: [id number]  Chief Complaint  Patient presents with   Cough    X3w, mostly productive, pnd tickle, low grade fever 99.7 at night, denies wheeze/shob   Diarrhea    Since yesterday, denies abd pain, is improving     Patient is in today for sick symptoms with a history of Afib, OSA, and smoking  Symptoms started 1 week before christmas No sick contacts Covid test: 3 all negative. Last one was yesterday   Flu vaccine: 07/26/2022 Covid vaccine: refused  Has been taking bendaryl that dries it up some but makes him sleepy.   Review of Systems  Constitutional:  Positive for chills, fever and malaise/fatigue.  HENT:  Negative for ear discharge, ear pain (clogged up), sinus pain and sore throat.   Respiratory:  Positive for cough and sputum production (thick and green). Negative for shortness of breath.   Gastrointestinal:  Positive for diarrhea (none today).  Musculoskeletal:  Positive for myalgias. Negative for joint pain.  Neurological:  Negative for headaches.        Objective:    BP 132/78   Pulse 76   Ht 6' 1.5" (1.867 m)   Wt (!) 305 lb (138.3 kg)   SpO2 97%   BMI 39.69 kg/m    Physical Exam Vitals and nursing note reviewed.  Constitutional:      Appearance: Normal appearance.  HENT:     Right Ear: Tympanic membrane, ear canal and external ear normal.     Left Ear: Tympanic membrane, ear canal and external ear normal.     Nose:     Right Sinus: No maxillary sinus tenderness or frontal sinus tenderness.     Left Sinus: No maxillary sinus tenderness or frontal sinus tenderness.     Mouth/Throat:     Mouth: Mucous membranes are moist.     Pharynx: Oropharynx is clear.  Cardiovascular:     Rate and Rhythm: Normal rate and regular rhythm.     Heart sounds: Normal heart sounds.  Pulmonary:     Effort: Pulmonary effort is normal.     Breath sounds: Normal  breath sounds.  Abdominal:     General: Bowel sounds are normal. There is no distension.     Palpations: There is no mass.     Tenderness: There is no abdominal tenderness.     Hernia: No hernia is present.  Lymphadenopathy:     Cervical: No cervical adenopathy.  Neurological:     Mental Status: He is alert.     No results found for any visits on 10/01/22.      Assessment & Plan:   Problem List Items Addressed This Visit       Respiratory   Bronchitis - Primary    Given length of illness comorbidities including smoking and being febrile with negative COVID test will treat with azithromycin for atypical infection/bronchitis.  Follow-up if no improvement      Relevant Medications   azithromycin (ZITHROMAX) 250 MG tablet     Other   Smoker    Still smoking.      Subacute cough    Fluticasone levocetirizine.  Patient will follow-up if no improvement also prescribed azithromycin pack      Relevant Medications   fluticasone (FLONASE) 50 MCG/ACT nasal spray   levocetirizine (XYZAL) 5 MG tablet    Meds ordered this  encounter  Medications   fluticasone (FLONASE) 50 MCG/ACT nasal spray    Sig: Place 2 sprays into both nostrils daily.    Dispense:  16 g    Refill:  0    Order Specific Question:   Supervising Provider    Answer:   Loura Pardon A [[id number]]   levocetirizine (XYZAL) 5 MG tablet    Sig: Take 1 tablet (5 mg total) by mouth every evening.    Dispense:  30 tablet    Refill:  0    Order Specific Question:   Supervising Provider    Answer:   Loura Pardon A [[id number]]   azithromycin (ZITHROMAX) 250 MG tablet    Sig: Take 2 tablets on day 1, then 1 tablet daily on days 2 through 5    Dispense:  6 tablet    Refill:  0    Order Specific Question:   Supervising Provider    Answer:   Loura Pardon A [[id number]]    Return if symptoms worsen or fail to improve.  Romilda Garret, NP

## 2022-10-01 NOTE — Assessment & Plan Note (Signed)
Fluticasone levocetirizine.  Patient will follow-up if no improvement also prescribed azithromycin pack

## 2022-10-22 ENCOUNTER — Other Ambulatory Visit: Payer: Self-pay

## 2022-10-22 MED ORDER — FLECAINIDE ACETATE 100 MG PO TABS: 100.0000 mg | ORAL_TABLET | Freq: Two times a day (BID) | ORAL | 0 refills | Status: DC

## 2022-10-22 MED ORDER — FLECAINIDE ACETATE 100 MG PO TABS: 100.0000 mg | ORAL_TABLET | Freq: Two times a day (BID) | ORAL | 3 refills | Status: DC

## 2022-10-22 NOTE — Telephone Encounter (Signed)
Pt's medication was sent to pt's pharmacy as requested. A 15 day supply was also sent to local pharmacy, until mail order arrives. Confirmation for both received.

## 2022-11-19 ENCOUNTER — Ambulatory Visit: Payer: 59 | Admitting: Family Medicine

## 2022-11-19 ENCOUNTER — Encounter: Payer: Self-pay | Admitting: Family Medicine

## 2022-11-19 VITALS — BP 126/70 | HR 66 | Temp 97.1°F | Ht 73.5 in | Wt 305.2 lb

## 2022-11-19 DIAGNOSIS — F331 Major depressive disorder, recurrent, moderate: Secondary | ICD-10-CM | POA: Diagnosis not present

## 2022-11-19 DIAGNOSIS — M2550 Pain in unspecified joint: Secondary | ICD-10-CM

## 2022-11-19 DIAGNOSIS — R5382 Chronic fatigue, unspecified: Secondary | ICD-10-CM

## 2022-11-19 DIAGNOSIS — M533 Sacrococcygeal disorders, not elsewhere classified: Secondary | ICD-10-CM

## 2022-11-19 MED ORDER — DULOXETINE HCL 30 MG PO CPEP: 30.0000 mg | ORAL_CAPSULE | Freq: Every day | ORAL | 5 refills | Status: DC

## 2022-11-19 NOTE — Patient Instructions (Addendum)
Return next week for early AM labwork.  Stop wellbutrin. In its place, start cymbalta '30mg'$  daily - sent to pharmacy.  Return in 1 month for follow up visit.

## 2022-11-19 NOTE — Progress Notes (Signed)
Patient ID: [name], male    DOB: July 23, 1971, 52 y.o.   MRN: [id number]  This visit was conducted in person.  BP 126/70   Pulse 66   Temp (!) 97.1 F (36.2 C) (Temporal)   Ht 6' 1.5" (1.867 m)   Wt (!) 305 lb 4 oz (138.5 kg)   SpO2 95%   BMI 39.73 kg/m    CC: discuss wellbutrin Subjective:   HPI: [name] is a 52 y.o. male presenting on 11/19/2022 for Discss Medication (Wants to discuss other options for Wellbutrin. )   H/o mood difficulty as well as peripheral neuropathy previously managed with nortriptyline '25mg'$  --> '75mg'$  nightly without improvement and with weight gain noted so last visit 07/2022 we tapered off TCA and increased wellbutrin SR from '100mg'$  to '150mg'$  daily.   Notes worsening mood with passive SI, no plan.  Notes decreased sex drive.  Endorses h/o low testosterone and would be interested in T replacement if indicated.   Known chronic osteoarthritis - cervical, thoracic, and lumbar back pain as well as marked muscle spasms due to this. Hands and shoulders hurt - notes shoulder stiffness. H/o L knee replacement. R knee doesn't bother him. Hips feel ok.   Marked body aches when he wakes up - this gets better as he starts moving during the day. Feels well from 8-12pm then joint pains again worsen, worse at night. Felt significantly better after prednisone course through PM&R. Continues celebrex '200mg'$  daily and gabapentin '600mg'$  TID with some benefit, also takes tylenol '1000mg'$  BID. Rare robaxin use, oxycodone a few times a week.   No joint redness or warmth or swelling. No skin rash.  No red eyes or eye pain or vision changes.   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 - trying turmeric per his  recommendation.   Flecainide use limits antidepressant selection (especially celexa, doxepin, imipramine).      Relevant past medical, surgical, family and social history reviewed and updated as indicated. Interim medical history since our last visit reviewed. Allergies and medications reviewed and updated. Outpatient Medications Prior to Visit  Medication Sig Dispense Refill   Acetaminophen 500 MG capsule Take 1,000 mg by mouth every 6 (six) hours as needed for headache.     celecoxib (CELEBREX) 200 MG capsule Take 200 mg by mouth daily.     flecainide (TAMBOCOR) 100 MG tablet Take 1 tablet (100 mg total) by mouth 2 (two) times daily. 30 tablet 0   fluticasone (FLONASE) 50 MCG/ACT nasal spray Place 2 sprays into both nostrils daily. 16 g 0   gabapentin (NEURONTIN) 600 MG tablet Take 600 mg by mouth 3 (three) times daily.     levocetirizine (XYZAL) 5 MG tablet Take 1 tablet (5 mg total) by mouth every evening. 30 tablet 0   methocarbamol (ROBAXIN) 500 MG tablet Take 500 mg by mouth 3 (three) times daily as needed for muscle spasms.     metoprolol succinate (TOPROL-XL) 25 MG 24 hr tablet Take 1 tablet (25 mg total) by mouth at bedtime. 90 tablet 3   oxyCODONE (OXY IR/ROXICODONE) 5 MG immediate release tablet Take 5 mg by mouth 2 (two) times daily as needed for severe pain or moderate pain.  TURMERIC PO Take by mouth daily.     buPROPion ER (WELLBUTRIN SR) 150 MG 12 hr tablet Take 1 tablet (150 mg total) by mouth daily. 90 tablet 3   No facility-administered medications prior to visit.     Per HPI unless specifically indicated in ROS section below Review of Systems  Objective:  BP 126/70   Pulse 66   Temp (!) 97.1 F (36.2 C) (Temporal)   Ht 6' 1.5" (1.867 m)   Wt (!) 305 lb 4 oz (138.5 kg)   SpO2 95%   BMI 39.73 kg/m   Wt Readings from Last 3 Encounters:  11/19/22 (!) 305 lb 4 oz (138.5 kg)  10/01/22 (!) 305 lb (138.3 kg)  07/27/22 (!) 311 lb (141.1 kg)      Physical  Exam Vitals and nursing note reviewed.  Constitutional:      Appearance: Normal appearance. He is not ill-appearing.  HENT:     Head: Normocephalic and atraumatic.  Eyes:     Extraocular Movements: Extraocular movements intact.     Conjunctiva/sclera: Conjunctivae normal.     Pupils: Pupils are equal, round, and reactive to light.  Cardiovascular:     Rate and Rhythm: Normal rate and regular rhythm.     Pulses: Normal pulses.     Heart sounds: Normal heart sounds. No murmur heard. Pulmonary:     Effort: Pulmonary effort is normal. No respiratory distress.     Breath sounds: Normal breath sounds. No wheezing, rhonchi or rales.  Musculoskeletal:        General: Tenderness present. Normal range of motion.     Cervical back: Normal range of motion and neck supple. No rigidity.     Right lower leg: No edema.     Left lower leg: No edema.     Comments:  No significant pain midline spine Lower lumbar paraspinous mm tenderness bilaterally Neg SLR bilaterally. No pain with int/ext rotation at hip. No pain at GTB or sciatic notch bilaterally.  Discomfort to L SIJ  Skin:    General: Skin is warm and dry.     Findings: No rash.  Neurological:     Mental Status: He is alert.  Psychiatric:        Mood and Affect: Mood normal.        Behavior: Behavior normal.    Lab Results  Component Value Date   CKTOTAL 107 07/26/2022   Lab Results  Component Value Date   ESRSEDRATE 16 07/26/2022  No results found for: "CRP" No results found for: "ANA"  Lab Results  Component Value Date   WBC 10.7 (H) 07/26/2022   HGB 14.9 07/26/2022   HCT 43.9 07/26/2022   MCV 86.6 07/26/2022   PLT 229.0 07/26/2022  No results found for: "LABURIC" Lab Results  Component Value Date   CREATININE 1.38 07/26/2022   BUN 19 07/26/2022   NA 137 07/26/2022   K 4.1 07/26/2022   CL 99 07/26/2022   CO2 30 07/26/2022    Lab Results  Component Value Date   ALT 12 07/26/2022   AST 13 07/26/2022   ALKPHOS  74 07/26/2022   BILITOT 0.2 07/26/2022    Lab Results  Component Value Date   CHOL 236 (H) 07/26/2022   HDL 41.30 07/26/2022   LDLCALC 135 (H) 07/29/2021   LDLDIRECT 144.0 07/26/2022   TRIG (H) 07/26/2022    435.0 Triglyceride is over 400; calculations on Lipids are invalid.   CHOLHDL 6 07/26/2022  11/19/2022    1:19 PM 07/26/2022    4:04 PM 02/01/2022    8:41 AM 07/27/2021    3:26 PM 07/27/2021    2:15 PM  Depression screen PHQ 2/9  Decreased Interest 2 0 '2 1 1  '$ Down, Depressed, Hopeless 2 0 0 0 0  PHQ - 2 Score 4 0 '2 1 1  '$ Altered sleeping '3  3 1   '$ Tired, decreased energy '3  2 1   '$ Change in appetite 1  0 0   Feeling bad or failure about yourself  0  0 0   Trouble concentrating 0  1 1   Moving slowly or fidgety/restless 0  0 0   Suicidal thoughts 1  0 0   PHQ-9 Score '12  8 4   '$ Difficult doing work/chores Not difficult at all  Not difficult at all         11/19/2022    1:20 PM 02/01/2022    8:42 AM 07/27/2021    3:27 PM  GAD 7 : Generalized Anxiety Score  Nervous, Anxious, on Edge 0 0 1  Control/stop worrying 0 0 0  Worry too much - different things 0 0 0  Trouble relaxing '3 3 3  '$ Restless 0 3 3  Easily annoyed or irritable '3 3 3  '$ Afraid - awful might happen 0 0 0  Total GAD 7 Score '6 9 10  '$ Anxiety Difficulty Not difficult at all      Assessment & Plan:   Problem List Items Addressed This Visit     MDD (major depressive disorder), recurrent episode, moderate (HCC)    Chronic, longstanding, worsening.  Last visit we tapered off nortriptyline and increased wellbutrin SR - notes no benefit with this, actually symptoms are worse including mood and pain.  Will fully stop wellbutrin and in its place start cymbalta.  Did not restart TCA both as it may interact with cymbalta and flecainide.       Relevant Medications   DULoxetine (CYMBALTA) 30 MG capsule   Polyarthralgia - Primary    Notes ongoing polyarthralgia to back, neck, shoulders, elbows, hands, and left  knee.  Describes pain and morning stiffness of inflammatory arthritis. He notes marked benefit with steroid course.  Will check ESR, CRP, ANA and anti HLA B27 when he returns to eval for axial ankylosing spondylopathy, polymyalgia, or other autoimmune process.  For now, continue celebex, robaxin, gabapentin, tylenol and PRN opiate.       Relevant Orders   Comprehensive metabolic panel   CBC with Differential/Platelet   Sedimentation rate   ANA   C-reactive protein   HLA-B27 antigen   Sacroiliac joint pain   Relevant Orders   HLA-B27 antigen   Chronic fatigue    Notes chronic fatigue, low energy, low sex drive, and depressed mood. Requests T checked which is reasonable. Last check 07/2021 low normal at 344.       Relevant Orders   Testosterone     Meds ordered this encounter  Medications   DULoxetine (CYMBALTA) 30 MG capsule    Sig: Take 1 capsule (30 mg total) by mouth daily.    Dispense:  30 capsule    Refill:  5    To replace wellbutrin    Orders Placed This Encounter  Procedures   Comprehensive metabolic panel    Standing Status:   Future    Standing Expiration Date:   11/19/2023   CBC with Differential/Platelet    Standing Status:   Future  Standing Expiration Date:   11/19/2023   Sedimentation rate    Standing Status:   Future    Standing Expiration Date:   11/19/2023   ANA    Standing Status:   Future    Standing Expiration Date:   11/19/2023   C-reactive protein    Standing Status:   Future    Standing Expiration Date:   11/19/2023   HLA-B27 antigen    Standing Status:   Future    Standing Expiration Date:   11/19/2023   Testosterone    Standing Status:   Future    Standing Expiration Date:   11/19/2023    Patient Instructions  Return next week for early AM labwork.  Stop wellbutrin. In its place, start cymbalta '30mg'$  daily - sent to pharmacy.  Return in 1 month for follow up visit.   Follow up plan: Return in about 1 month (around 12/20/2022), or if symptoms  worsen or fail to improve, for follow up visit.  Ria Bush, MD

## 2022-11-19 NOTE — Assessment & Plan Note (Signed)
Notes ongoing polyarthralgia to back, neck, shoulders, elbows, hands, and left knee.  Describes pain and morning stiffness of inflammatory arthritis. He notes marked benefit with steroid course.  Will check ESR, CRP, ANA and anti HLA B27 when he returns to eval for axial ankylosing spondylopathy, polymyalgia, or other autoimmune process.  For now, continue celebex, robaxin, gabapentin, tylenol and PRN opiate.

## 2022-11-19 NOTE — Assessment & Plan Note (Signed)
Chronic, longstanding, worsening.  Last visit we tapered off nortriptyline and increased wellbutrin SR - notes no benefit with this, actually symptoms are worse including mood and pain.  Will fully stop wellbutrin and in its place start cymbalta.  Did not restart TCA both as it may interact with cymbalta and flecainide.

## 2022-11-19 NOTE — Assessment & Plan Note (Signed)
Notes chronic fatigue, low energy, low sex drive, and depressed mood. Requests T checked which is reasonable. Last check 07/2021 low normal at 344.

## 2022-11-22 ENCOUNTER — Other Ambulatory Visit (INDEPENDENT_AMBULATORY_CARE_PROVIDER_SITE_OTHER): Payer: 59

## 2022-11-22 DIAGNOSIS — M533 Sacrococcygeal disorders, not elsewhere classified: Secondary | ICD-10-CM

## 2022-11-22 DIAGNOSIS — M2550 Pain in unspecified joint: Secondary | ICD-10-CM | POA: Diagnosis not present

## 2022-11-22 DIAGNOSIS — R5382 Chronic fatigue, unspecified: Secondary | ICD-10-CM

## 2022-11-23 LAB — SEDIMENTATION RATE: Sed Rate: 21 mm/hr — ABNORMAL HIGH (ref 0–20)

## 2022-11-23 LAB — CBC WITH DIFFERENTIAL/PLATELET
Basophils Absolute: 0.1 10*3/uL (ref 0.0–0.1)
Basophils Relative: 1 % (ref 0.0–3.0)
Eosinophils Absolute: 0.1 10*3/uL (ref 0.0–0.7)
Eosinophils Relative: 1.3 % (ref 0.0–5.0)
HCT: 42.2 % (ref 39.0–52.0)
Hemoglobin: 14.4 g/dL (ref 13.0–17.0)
Lymphocytes Relative: 31.3 % (ref 12.0–46.0)
Lymphs Abs: 2.8 10*3/uL (ref 0.7–4.0)
MCHC: 34.2 g/dL (ref 30.0–36.0)
MCV: 88.6 fl (ref 78.0–100.0)
Monocytes Absolute: 0.6 10*3/uL (ref 0.1–1.0)
Monocytes Relative: 6.5 % (ref 3.0–12.0)
Neutro Abs: 5.4 10*3/uL (ref 1.4–7.7)
Neutrophils Relative %: 59.9 % (ref 43.0–77.0)
Platelets: 225 10*3/uL (ref 150.0–400.0)
RBC: 4.77 Mil/uL (ref 4.22–5.81)
RDW: 14.3 % (ref 11.5–15.5)
WBC: 9.1 10*3/uL (ref 4.0–10.5)

## 2022-11-23 LAB — COMPREHENSIVE METABOLIC PANEL
ALT: 10 U/L (ref 0–53)
AST: 12 U/L (ref 0–37)
Albumin: 3.9 g/dL (ref 3.5–5.2)
Alkaline Phosphatase: 66 U/L (ref 39–117)
BUN: 19 mg/dL (ref 6–23)
CO2: 27 mEq/L (ref 19–32)
Calcium: 9.4 mg/dL (ref 8.4–10.5)
Chloride: 102 mEq/L (ref 96–112)
Creatinine, Ser: 1.26 mg/dL (ref 0.40–1.50)
GFR: 64.86 mL/min (ref >60.00)
Glucose, Bld: 93 mg/dL (ref 70–99)
Potassium: 4 mEq/L (ref 3.5–5.1)
Sodium: 139 mEq/L (ref 135–145)
Total Bilirubin: 0.3 mg/dL (ref 0.2–1.2)
Total Protein: 6.6 g/dL (ref 6.0–8.3)

## 2022-11-23 LAB — C-REACTIVE PROTEIN: CRP: 1 mg/dL (ref 0.5–20.0)

## 2022-11-23 LAB — TESTOSTERONE: Testosterone: 219.49 ng/dL — ABNORMAL LOW (ref 300.00–890.00)

## 2022-11-24 ENCOUNTER — Encounter: Payer: Self-pay | Admitting: Family Medicine

## 2022-11-24 DIAGNOSIS — R7989 Other specified abnormal findings of blood chemistry: Secondary | ICD-10-CM

## 2022-11-24 LAB — ANA: Anti Nuclear Antibody (ANA): NEGATIVE

## 2022-11-24 LAB — HLA-B27 ANTIGEN: HLA-B27 Antigen: NEGATIVE

## 2022-11-25 DIAGNOSIS — R7989 Other specified abnormal findings of blood chemistry: Secondary | ICD-10-CM | POA: Insufficient documentation

## 2022-11-25 MED ORDER — DULOXETINE HCL 20 MG PO CPEP: 20.0000 mg | ORAL_CAPSULE | Freq: Every day | ORAL | 5 refills | Status: DC

## 2022-12-06 ENCOUNTER — Other Ambulatory Visit: Payer: 59

## 2022-12-07 ENCOUNTER — Other Ambulatory Visit (INDEPENDENT_AMBULATORY_CARE_PROVIDER_SITE_OTHER): Payer: 59

## 2022-12-07 DIAGNOSIS — R7989 Other specified abnormal findings of blood chemistry: Secondary | ICD-10-CM

## 2022-12-07 LAB — IBC PANEL
Iron: 107 ug/dL (ref 42–165)
Saturation Ratios: 34.9 % (ref 20.0–50.0)
TIBC: 306.6 ug/dL (ref 250.0–450.0)
Transferrin: 219 mg/dL (ref 212.0–360.0)

## 2022-12-07 LAB — LUTEINIZING HORMONE: LH: 4.64 m[IU]/mL (ref 1.50–9.30)

## 2022-12-07 LAB — FOLLICLE STIMULATING HORMONE: FSH: 3.2 m[IU]/mL (ref 1.4–18.1)

## 2022-12-07 LAB — T4, FREE: Free T4: 0.91 ng/dL (ref 0.60–1.60)

## 2022-12-11 LAB — TESTOS,TOTAL,FREE AND SHBG (FEMALE)
Free Testosterone: 60.9 pg/mL (ref 35.0–155.0)
Sex Hormone Binding: 32.4 nmol/L (ref 10–50)
Testosterone, Total, LC-MS-MS: 365 ng/dL (ref 250–1100)

## 2022-12-11 LAB — CORTISOL-AM, BLOOD: Cortisol - AM: 14.3 ug/dL

## 2022-12-11 LAB — PROLACTIN: Prolactin: 4.4 ng/mL (ref 2.0–18.0)

## 2022-12-20 ENCOUNTER — Ambulatory Visit: Payer: 59 | Admitting: Family Medicine

## 2022-12-22 ENCOUNTER — Encounter: Payer: Self-pay | Admitting: Family Medicine

## 2022-12-22 ENCOUNTER — Ambulatory Visit: Payer: 59 | Admitting: Family Medicine

## 2022-12-22 VITALS — BP 154/68 | HR 82 | Temp 97.5°F | Ht 73.5 in | Wt 300.0 lb

## 2022-12-22 DIAGNOSIS — M2550 Pain in unspecified joint: Secondary | ICD-10-CM

## 2022-12-22 DIAGNOSIS — R030 Elevated blood-pressure reading, without diagnosis of hypertension: Secondary | ICD-10-CM | POA: Diagnosis not present

## 2022-12-22 DIAGNOSIS — G6289 Other specified polyneuropathies: Secondary | ICD-10-CM

## 2022-12-22 NOTE — Assessment & Plan Note (Addendum)
Notes worsening neuropathic pain and trouble holding on to objects at bilateral arms and hands - despite h/o bilateral CT repair and ulnar transposition surgeries 2022 ([facility]) - planning to return to hand surgeon for evaluation.  Continues gabapentin 600mg  TID.  Will trial cymbalta.

## 2022-12-22 NOTE — Patient Instructions (Addendum)
Try lower dose cymbalta and let me know how you do with this.  We will refer you to rheumatologist for evaluation of possible inflammatory arthritis.  Follow up with Dr Amedeo Plenty for worsening arm symptoms.  Good to see you today.

## 2022-12-22 NOTE — Assessment & Plan Note (Signed)
BP elevated today - he did miss metoprolol dose last night as well as gabapentin - and he is currently in pain - all this can contribute to elevated blood pressures.

## 2022-12-22 NOTE — Progress Notes (Addendum)
Patient ID: [name], male    DOB: [birth year], 52 y.o.   MRN: [id number]  This visit was conducted in person.  BP (!) 154/68   Pulse 82   Temp (!) 97.5 F (36.4 C) (Temporal)   Ht 6' 1.5" (1.867 m)   Wt 300 lb (136.1 kg)   SpO2 94%   BMI 39.04 kg/m   BP Readings from Last 3 Encounters:  12/22/22 (!) 154/68  11/19/22 126/70  10/01/22 132/78    CC: 1 mo f/u visit  Subjective:   HPI: [name] is a 52 y.o. male presenting on 12/22/2022 for Medical Management of Chronic Issues (Here for 1 mo mood f/u.)   BP elevated - missed metoprolol dose last night due to work emergency - up all night with burst water line - going to bed when he gets home, will take meds as well.   See prior note for details.  Chronic pain presumed from neuropathy, osteoarthritis and muscle spasms. Described symptoms of inflammatory arthritis with worse pain when he wakes up with morning stiffness for 1 hour. Wakes up daily at 4am due to pain. Starts feeling better around 6am for 5 hours then pain returns. Testing overall reassuring including ESR, ANA, anti-HLA-B27. Initial testosterone level low, normalized on repeat. Continues back pain as well as ongoing neuropathic arm pain despite bilateral carpal tunnel repair and ulnar nerve transposition 2022, also easily drops things he carries - planning to return to see Dr Amedeo Plenty.    No fevers/chills, redness/swelling/warmth of joints, new rashes, red eyes, oral lesions.   Continues tylenol 1000mg  PRN, celebrex 200mg  daily, gabapentin 600mg  TID and robaxin 500mg  TID PRN. Also on oxycodone 5mg  BID PRN breakthrough pain as well as turmeric 500mg  twice daily. Notes prednisone course was the most helpful.   Last visit we started cymbalta 30mg  daily - this caused nausea so we dropped dose to 20mg  daily - he never started this. Significant improvement in mood based on PHQ score.   Continues seeing Dr Nelva Bush PM&R s/p ESI. Recent lumbar MRI by Dr Nelva Bush - referred to  neurosurgery Dr Christella Noa who he saw yesterday - significant scarring to prior lumbar fusion. He recommended return to [facility] to consider cervical MRI as well as recommended rheumatology evaluation.   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      Relevant past medical, surgical, family and social history reviewed and updated as indicated. Interim medical history since our last visit reviewed. Allergies and medications reviewed and updated. Outpatient Medications Prior to Visit  Medication Sig Dispense Refill   Acetaminophen 500 MG capsule Take 1,000 mg by mouth every 6 (six) hours as needed for headache.     celecoxib (CELEBREX) 200 MG capsule Take 200 mg by mouth daily.     flecainide (TAMBOCOR) 100 MG tablet Take 1 tablet (100 mg total) by mouth 2 (two) times daily. 30 tablet 0   fluticasone (FLONASE) 50 MCG/ACT nasal spray Place 2 sprays into both nostrils daily. 16 g 0   gabapentin (NEURONTIN) 600 MG tablet Take 600 mg by mouth 3 (three) times daily.     levocetirizine (XYZAL) 5 MG tablet Take 1 tablet (5 mg total) by mouth every evening. 30 tablet 0  methocarbamol (ROBAXIN) 500 MG tablet Take 500 mg by mouth 3 (three) times daily as needed for muscle spasms.     metoprolol succinate (TOPROL-XL) 25 MG 24 hr tablet Take 1 tablet (25 mg total) by mouth at bedtime. 90 tablet 3   oxyCODONE (OXY IR/ROXICODONE) 5 MG immediate release tablet Take 5 mg by mouth 2 (two) times daily as needed for severe pain or moderate pain.     TURMERIC PO Take by mouth daily.     DULoxetine (CYMBALTA) 20 MG capsule Take 1 capsule (20 mg total) by mouth daily. (Patient not taking: Reported on 12/22/2022) 30 capsule  5   No facility-administered medications prior to visit.     Per HPI unless specifically indicated in ROS section below Review of Systems  Objective:  BP (!) 154/68   Pulse 82   Temp (!) 97.5 F (36.4 C) (Temporal)   Ht 6' 1.5" (1.867 m)   Wt 300 lb (136.1 kg)   SpO2 94%   BMI 39.04 kg/m   Wt Readings from Last 3 Encounters:  12/22/22 300 lb (136.1 kg)  11/19/22 (!) 305 lb 4 oz (138.5 kg)  10/01/22 (!) 305 lb (138.3 kg)      Physical Exam Vitals and nursing note reviewed.  Constitutional:      Appearance: Normal appearance. He is not ill-appearing.  HENT:     Mouth/Throat:     Mouth: Mucous membranes are moist.     Pharynx: Oropharynx is clear. No oropharyngeal exudate or posterior oropharyngeal erythema.  Eyes:     Extraocular Movements: Extraocular movements intact.     Pupils: Pupils are equal, round, and reactive to light.  Cardiovascular:     Rate and Rhythm: Normal rate and regular rhythm.     Pulses: Normal pulses.     Heart sounds: Normal heart sounds. No murmur heard. Pulmonary:     Effort: Pulmonary effort is normal. No respiratory distress.     Breath sounds: Normal breath sounds. No wheezing, rhonchi or rales.  Neurological:     Mental Status: He is alert.       Results for orders placed or performed in visit on 12/07/22  Cortisol-am, blood  Result Value Ref Range   Cortisol - AM 123456 mcg/dL  Follicle stimulating hormone  Result Value Ref Range   FSH 3.2 1.4 - 18.1 mIU/ML  Luteinizing hormone  Result Value Ref Range   LH 4.64 1.50 - 9.30 mIU/mL  Prolactin  Result Value Ref Range   Prolactin 4.4 2.0 - 18.0 ng/mL  T4, free  Result Value Ref Range   Free T4 0.91 0.60 - 1.60 ng/dL  IBC panel  Result Value Ref Range   Iron 107 42 - 165 ug/dL   Transferrin 219.0 212.0 - 360.0 mg/dL   Saturation Ratios 34.9 20.0 - 50.0 %   TIBC 306.6 250.0 - 450.0 mcg/dL  Testos,Total,Free and SHBG (Male)  Result Value Ref Range   Testosterone, Total,  LC-MS-MS 365 250 - 1,100 ng/dL   Free Testosterone 60.9 35.0 - 155.0 pg/mL   Sex Hormone Binding 32.4 10 - 50 nmol/L   Lab Results  Component Value Date   WBC 9.1 11/22/2022   HGB 14.4 11/22/2022   HCT 42.2 11/22/2022   MCV 88.6 11/22/2022   PLT 225.0 11/22/2022    Lab Results  Component Value Date   CREATININE 1.26 11/22/2022   BUN 19 11/22/2022   NA 139 11/22/2022   K 4.0 11/22/2022   CL 102  11/22/2022   CO2 27 11/22/2022    Lab Results  Component Value Date   ALT 10 11/22/2022   AST 12 11/22/2022   ALKPHOS 66 11/22/2022   BILITOT 0.3 11/22/2022   Lab Results  Component Value Date   ANA NEGATIVE 11/22/2022   Lab Results  Component Value Date   ESRSEDRATE 21 (H) 11/22/2022    Lab Results  Component Value Date   CRP 1.0 11/22/2022  HLA-B27 = negative (11/22/2022)     12/22/2022    8:50 AM 11/19/2022    1:19 PM 07/26/2022    4:04 PM 02/01/2022    8:41 AM 07/27/2021    3:26 PM  Depression screen PHQ 2/9  Decreased Interest 0 2 0 2 1  Down, Depressed, Hopeless 0 2 0 0 0  PHQ - 2 Score 0 4 0 2 1  Altered sleeping 1 3  3 1   Tired, decreased energy 1 3  2 1   Change in appetite 0 1  0 0  Feeling bad or failure about yourself  0 0  0 0  Trouble concentrating 0 0  1 1  Moving slowly or fidgety/restless 0 0  0 0  Suicidal thoughts 0 1  0 0  PHQ-9 Score 2 12  8 4   Difficult doing work/chores Not difficult at all Not difficult at all  Not difficult at all       12/22/2022    8:53 AM 11/19/2022    1:20 PM 02/01/2022    8:42 AM 07/27/2021    3:27 PM  GAD 7 : Generalized Anxiety Score  Nervous, Anxious, on Edge 0 0 0 1  Control/stop worrying 0 0 0 0  Worry too much - different things 0 0 0 0  Trouble relaxing 1 3 3 3   Restless 0 0 3 3  Easily annoyed or irritable 1 3 3 3   Afraid - awful might happen 0 0 0 0  Total GAD 7 Score 2 6 9 10   Anxiety Difficulty  Not difficult at all     Assessment & Plan:   Problem List Items Addressed This Visit     Peripheral  neuropathy    Notes worsening neuropathic pain and trouble holding on to objects at bilateral arms and hands - despite h/o bilateral CT repair and ulnar transposition surgeries 2022 ([facility]) - planning to return to hand surgeon for evaluation.  Continues gabapentin 600mg  TID.  Will trial cymbalta.       Relevant Orders   Ambulatory referral to Rheumatology   Polyarthralgia - Primary    Ongoing - known significant osteoarthritis but also describes symptoms of inflammatory arthritis. Will refer to rheumatology per pt and neurosurgery request for further evaluation of polyarthralgia, possible inflammatory arthritis component.  He will continue celebrex 200mg  daily, gabapentin 600mg  TID, tylenol 1000mg  BID, robaxin PRN, and oxycodone PRN. Also now on turmeric. Prednisone course has been most effective treatment.  Recent labwork reassuring (ANA, anti-HLAB27, ESR, CRP).  Will trial cymbalta 20mg  dose - 30mg  dose caused nausea.  Offered physical therapy referral, consider dry needling, acupuncture, consider referral to [facility].      Relevant Orders   Ambulatory referral to Rheumatology   Elevated blood pressure reading    BP elevated today - he did miss metoprolol dose last night as well as gabapentin - and he is currently in pain - all this can contribute to elevated blood pressures.         No orders of the defined  types were placed in this encounter.   Orders Placed This Encounter  Procedures   Ambulatory referral to Rheumatology    Referral Priority:   Routine    Referral Type:   Consultation    Referral Reason:   Specialty Services Required    Requested Specialty:   Rheumatology    Number of Visits Requested:   1    Patient Instructions  Try lower dose cymbalta and let me know how you do with this.  We will refer you to rheumatologist for evaluation of possible inflammatory arthritis.  Follow up with Dr Amedeo Plenty for worsening arm symptoms.  Good to see you today.    Follow up plan: Return in about 3 months (around 03/23/2023), or if symptoms worsen or fail to improve, for follow up visit.  Ria Bush, MD

## 2022-12-22 NOTE — Assessment & Plan Note (Addendum)
Ongoing - known significant osteoarthritis but also describes symptoms of inflammatory arthritis. Will refer to rheumatology per pt and neurosurgery request for further evaluation of polyarthralgia, possible inflammatory arthritis component.  He will continue celebrex 200mg  daily, gabapentin 600mg  TID, tylenol 1000mg  BID, robaxin PRN, and oxycodone PRN. Also now on turmeric. Prednisone course has been most effective treatment.  Recent labwork reassuring (ANA, anti-HLAB27, ESR, CRP).  Will trial cymbalta 20mg  dose - 30mg  dose caused nausea.  Offered physical therapy referral, consider dry needling, acupuncture, consider referral to [facility].

## 2022-12-27 ENCOUNTER — Encounter: Payer: Self-pay | Admitting: Family Medicine

## 2023-01-27 ENCOUNTER — Encounter: Payer: Self-pay | Admitting: Internal Medicine

## 2023-01-27 ENCOUNTER — Ambulatory Visit: Payer: 59 | Attending: Internal Medicine | Admitting: Internal Medicine

## 2023-01-27 VITALS — BP 126/84 | HR 61 | Ht 73.5 in | Wt 299.0 lb

## 2023-01-27 DIAGNOSIS — I4819 Other persistent atrial fibrillation: Secondary | ICD-10-CM

## 2023-01-27 NOTE — Progress Notes (Signed)
Patient Care Team: Eustaquio Boyden, MD as PCP - General (Family Medicine) Lewayne Bunting, MD as PCP - Cardiology (Cardiology) [name], MD as PCP - Electrophysiology (Cardiology)   HPI  [name] is a 52 y.o. male seen in followup for afib  Cardioversion 6/22, repeat 7/22   on flecaindie no interval afib of which he is aware Not on anticoagulation with chadsvasc of 0 \ Biggest problem is back pain; no shortness of breath or chestpain, no edema  DATE PR interval QRSduration PQRS Dose  7/22  156 106 262 0  5/24 162 108 270 100        Date Cr K Hgb  6/19 1.23 4.0 14.7  10/20 1.00 4.1 13.6  7/22 1.10 3.4 14.8    DATE TEST EF%    07/22 Echo  60-65 %             Thromboembolic risk factors ( 0 ) for a CHADSVASc Score of 0     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   Colon polyps 2011   rpt Q5 yrs   DDD (degenerative disc disease) SPINE   History of kidney stones    HLD (hyperlipidemia)    Internal hemorrhoids    Kidney stone    [facility] on CPAP DR CLANCE   CPAP   Paroxysmal A-fib (HCC) CARDIOLOGIST-  DR [name]-- LAST VISIT 10-22-2010 IN [facility]   controlled with flecainide 300mg  prn- this medication is out of date   Seasonal allergies    Sleep apnea    wears CPAP    Past Surgical History:  Procedure Laterality Date   ADENOIDECTOMY  AGE 10   ANTERIOR LUMBAR DISKECTOMY / DECOMPRESSION   10/08/2010   L5 - S1, LEFT SIDE ([facility])   ANTERIOR LUMBAR FUSION  07/2014   L4/5, L5/S1 [facility]   CARDIOVERSION  1996, 2008   x2 in 1996   CARDIOVERSION N/A 03/17/2021   Procedure: CARDIOVERSION;  Surgeon: Elease Hashimoto Deloris Ping, MD;  Location: [facility] ENDOSCOPY;  Service: Cardiovascular;  Laterality: N/A;   CARPAL TUNNEL RELEASE Bilateral 2022   first left then right (Dr Amanda Pea)   COLONOSCOPY  2011   polyp, rec rpt 5 yrs    COLONOSCOPY  12/2019   int hem, rpt 10 yrs Marina Goodell)   CT chest noncontrast  06/2011   f/u L pulm nodule - gone.   HIATAL HERNIA REPAIR  1999   HIP ARTHROSCOPY  09/23/2011   LEFT HIP [facility]    KNEE ARTHROSCOPY WITH MEDIAL MENISECTOMY Left 08/24/2018   Procedure: LEFT KNEE ARTHROSCOPY WITH PARTIAL MEDIAL MENISECTOMY, ARTHROSCOPIC INTERNAL FIXATION MEDIAL FEMORAL CONDYLE AND MEDIAL TIBIAL PLATEAU, CHONDROPLASTY;  Surgeon: Eugenia Mcalpine, MD;  Location: [facility] [place];  Service: Orthopedics;  Laterality: Left;   SUBOCCIPITAL CRANIECTOMY CERVICAL LAMINECTOMY  11-30-2000  DR NUDALMAN   C1 AND PARTIAL C2--   CHIARI  MALFORMATION   TOTAL KNEE ARTHROPLASTY Left 07/20/2019   Procedure: TOTAL KNEE ARTHROPLASTY;  Surgeon: Eugenia Mcalpine, MD;  Location: [facility];  Service: Orthopedics;  Laterality: Left;  with adductor canal   ULNAR NERVE TRANSPOSITION Bilateral 2022   left then right (Dr Amanda Pea)   URETEROSCOPY  03/02/2012   Procedure: URETEROSCOPY;  Surgeon: Marcine Matar, MD;  Location: [facility];  Service: Urology;  Laterality: Left;  stone obtained    Current Meds  Medication Sig   Acetaminophen 500 MG capsule Take 1,000 mg by mouth every 6 (six) hours as needed for headache.   celecoxib (CELEBREX) 200 MG capsule Take 200 mg by mouth daily.   flecainide (TAMBOCOR) 100 MG tablet Take 1 tablet (100 mg total) by mouth 2 (two) times daily.   gabapentin (NEURONTIN) 600 MG tablet Take 600 mg by mouth 3 (three) times daily.   levocetirizine (XYZAL) 5 MG tablet Take 1 tablet (5 mg total) by mouth every evening.   methocarbamol (ROBAXIN) 500 MG tablet Take 500 mg by mouth 3 (three) times daily as needed for muscle spasms.   metoprolol succinate (TOPROL-XL) 25 MG 24 hr tablet Take 1 tablet (25 mg total) by mouth at bedtime.   oxyCODONE (OXY IR/ROXICODONE) 5 MG immediate release tablet Take 5 mg by mouth 2 (two) times daily as needed for severe pain or moderate pain.    [DISCONTINUED] TURMERIC PO Take by mouth daily.    Allergies  Allergen Reactions   Ibuprofen Swelling      Review of Systems negative except from HPI and PMH  Physical Exam BP 126/84   Pulse 61   Ht 6' 1.5" (1.867 m)   Wt 299 lb (135.6 kg)   SpO2 97%   BMI 38.91 kg/m  Well developed and well nourished in no acute distress HENT normal E scleral and icterus clear Neck Supple JVP flat; carotids brisk and full Clear to ausculation Regular rate and rhythm, no murmurs gallops or rub Soft with active bowel sounds No clubbing cyanosis  Edema Alert and oriented, grossly normal motor and sensory function Skin Warm and Dry  ECG sinus @ 61  16/11/39  CrCl cannot be calculated (Patient's most recent lab result is older than the maximum 21 days allowed.).   Assessment and  Plan Atrial fibrillation persistent  Back pain  Continue flecainide and toprol  F/u 1 year     Current medicines are reviewed at length with the patient today .  The patient does not  have concerns regarding medicines.

## 2023-01-27 NOTE — Patient Instructions (Signed)
Medication Instructions:  Your physician recommends that you continue on your current medications as directed. Please refer to the Current Medication list given to you today.  *If you need a refill on your cardiac medications before your next appointment, please call your pharmacy*   Lab Work: None ordered.  If you have labs (blood work) drawn today and your tests are completely normal, you will receive your results only by: MyChart Message (if you have MyChart) OR A paper copy in the mail If you have any lab test that is abnormal or we need to change your treatment, we will call you to review the results.   Testing/Procedures: None ordered.    Follow-Up: At  [facility], you and your health needs are our priority.  As part of our continuing mission to provide you with exceptional heart care, we have created designated Provider Care Teams.  These Care Teams include your primary Cardiologist (physician) and Advanced Practice Providers (APPs -  Physician Assistants and Nurse Practitioners) who all work together to provide you with the care you need, when you need it.  We recommend signing up for the patient portal called "MyChart".  Sign up information is provided on this After Visit Summary.  MyChart is used to connect with patients for Virtual Visits (Telemedicine).  Patients are able to view lab/test results, encounter notes, upcoming appointments, etc.  Non-urgent messages can be sent to your provider as well.   To learn more about what you can do with MyChart, go to [email or web address].    Your next appointment:   12 months with Dr Klein 

## 2023-03-16 ENCOUNTER — Encounter: Payer: Self-pay | Admitting: Family Medicine

## 2023-03-16 DIAGNOSIS — G6289 Other specified polyneuropathies: Secondary | ICD-10-CM

## 2023-03-16 DIAGNOSIS — Z8349 Family history of other endocrine, nutritional and metabolic diseases: Secondary | ICD-10-CM

## 2023-05-17 NOTE — Progress Notes (Signed)
Office Visit Note  Patient: [name]             Date of Birth: [birth year]           MRN: [id number]             PCP: Eustaquio Boyden, MD Referring: Eustaquio Boyden, MD Visit Date: 05/31/2023 Occupation: @GUAROCC @  Subjective:  Pain in multiple joints  History of Present Illness: [name] is a 52 y.o. male referred by his PCP for the evaluation of pain in multiple joints.  According to the patient his joint pain started in his 30s.  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  He is to be on his knees and his elbows all the time.  He gradually developed worsening of his joint pain.  He also developed lower back pain many years ago and was diagnosed with disc disease.  He underwent anterior lumbar discectomy in 2012 followed by lumbar spine fusion in 2015.  He continues to have lower back pain.  He had left knee joint meniscal tear repaired in 2019 followed by a left total knee replacement in 2020 by Dr. Thomasena Edis.  He has seen Dr. Nelva Nay for elbow joint and wrist joint pain.  He underwent ulnar nerve release and bilateral carpal tunnel release by Dr. Nelva Nay in 2022.  He had been under care of Dr. Vear Clock in the past and now has been under care of Dr. Ethelene Hal for pain management.  He states he is taken Celebrex for many years and currently has been on Celebrex, methocarbamol, oxycodone and gabapentin.  He had a Arnold-Chiari malformation and had surgery by Dr. Newell Coral since then he is limited range of motion of his cervical spine.  He continues to have pain and discomfort in his right SI joint.  He is scheduled to have a right SI joint fusion by Dr. Lorenso Courier at [facility].  He denies any history of joint swelling.  There is no family history of ankylosing spondylitis or inflammatory bowel disease.  He had plantar fasciitis in the past but no history of recurrent plantar fasciitis or Achilles tendinitis.  There is no history of uveitis.  No history of  psoriasis or inflammatory bowel disease.    Activities of Daily Living:  Patient reports morning stiffness for all day. Patient Reports nocturnal pain.  Difficulty dressing/grooming: Reports Difficulty climbing stairs: Denies Difficulty getting out of chair: Reports Difficulty using hands for taps, buttons, cutlery, and/or writing: Reports  Review of Systems  Constitutional:  Positive for fatigue.  HENT:  Negative for mouth sores and mouth dryness.   Eyes:  Negative for dryness.  Respiratory:  Negative for shortness of breath.   Cardiovascular:  Negative for chest pain and palpitations.  Gastrointestinal:  Negative for blood in stool, constipation and diarrhea.  Endocrine: Negative for increased urination.  Genitourinary:  Negative for involuntary urination.  Musculoskeletal:  Positive for joint pain, joint pain, joint swelling and morning stiffness. Negative for gait problem, myalgias, muscle weakness, muscle tenderness and myalgias.  Skin:  Negative for color change, rash and sensitivity to sunlight.  Allergic/Immunologic: Negative for susceptible to infections.  Neurological:  Negative for dizziness and headaches.  Hematological:  Negative for swollen glands.  Psychiatric/Behavioral:  Positive for sleep disturbance. Negative for depressed mood. The patient is not nervous/anxious.     PMFS History:  Patient Active Problem List   Diagnosis Date Noted   Elevated blood pressure reading 12/22/2022  Low testosterone in male 11/25/2022   Chronic fatigue 11/19/2022   Subacute cough 10/01/2022   Bronchitis 10/01/2022   Encounter for general adult medical examination with abnormal findings 07/27/2021   MDD (major depressive disorder), recurrent episode, moderate (HCC) 07/27/2021   Peripheral neuropathy 07/27/2021   Sacroiliac joint pain 07/25/2021   Polyarthralgia 06/09/2021   Ulnar nerve compression 03/18/2021   Bilateral carpal tunnel syndrome 03/09/2021   Disorder of both  ulnar nerves 03/04/2021   Bilateral elbow joint pain 03/04/2021   Nicotine dependence 03/04/2021   Lumbar radiculopathy 01/27/2021   Cervical radiculopathy 01/22/2021   DDD (degenerative disc disease), cervical 11/13/2019   Family history of premature CAD 06/26/2019   Degeneration of lumbar intervertebral disc 01/02/2019   Lumbar post-laminectomy syndrome 01/02/2019   S/P left knee arthroscopy 08/24/2018   Osteoarthritis of left knee 07/26/2018   History of spinal arthrodesis 09/26/2014   Foraminal stenosis of lumbar region 05/31/2014   Lumbosacral radiculopathy at L5 05/31/2014   Chronic, continuous use of opioids 05/17/2014   Kidney stones 01/19/2012   Preoperative clearance 09/10/2011   Smoker 07/22/2011   Left groin pain 04/27/2011   DEGENERATIVE DISC DISEASE, LUMBOSACRAL SPINE W/RADICULOPATHY 09/22/2010   GERD 04/09/2010   OSA on CPAP 03/16/2010   BACK PAIN, THORACIC REGION 02/24/2010   Chronic low back pain with sciatica 02/24/2010   HLD (hyperlipidemia) 01/15/2009   ATRIAL FIBRILLATION, PAROXYSMAL 01/15/2009    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   Colon polyps 2011   rpt Q5 yrs   DDD (degenerative disc disease) SPINE   History of kidney stones    HLD (hyperlipidemia)    Internal hemorrhoids    Kidney stone    OSA on CPAP DR CLANCE   CPAP   Paroxysmal A-fib (HCC) CARDIOLOGIST-  DR [name]-- LAST VISIT 10-22-2010 IN [facility]   controlled with flecainide 300mg  prn- this medication is out of date   Seasonal allergies    Sleep apnea    wears CPAP    Family History  Problem Relation Age of Onset   Hypertension Mother    Heart attack Mother 29   Heart disease Mother    Coronary artery disease Mother 44   Atrial fibrillation Mother    Coronary artery disease Father 8       10v CABG   Heart disease Father 29       CABG   Diabetes Father    Alcohol  abuse Father    Liver cancer Father 20   Cancer Paternal Uncle        lung, leukemia   Suicidality Paternal Uncle    Hemochromatosis Cousin    Liver disease Other        Grandmother (P or M not specified)   Healthy Daughter    Colon cancer Neg Hx    Esophageal cancer Neg Hx    Stomach cancer Neg Hx    Rectal cancer Neg Hx    Past Surgical History:  Procedure Laterality Date   ADENOIDECTOMY  AGE 75   ANTERIOR LUMBAR DISKECTOMY / DECOMPRESSION   10/08/2010   L5 - S1, LEFT SIDE ([place])   ANTERIOR LUMBAR FUSION  07/2014   L4/5, L5/S1 [facility]   CARDIOVERSION  1996, 2008   x2 in 1996   CARDIOVERSION N/A 03/17/2021   Procedure: CARDIOVERSION;  Surgeon: Elease Hashimoto Deloris Ping, MD;  Location: [facility];  Service: Cardiovascular;  Laterality: N/A;   CARPAL TUNNEL RELEASE Bilateral 2022   first left then right (Dr Amanda Pea)   COLONOSCOPY  2011   polyp, rec rpt 5 yrs   COLONOSCOPY  12/2019   int hem, rpt 10 yrs Marina Goodell)   CT chest noncontrast  06/2011   f/u L pulm nodule - gone.   HIATAL HERNIA REPAIR  1999   HIP ARTHROSCOPY  09/23/2011   LEFT HIP [place]    KNEE ARTHROSCOPY WITH MEDIAL MENISECTOMY Left 08/24/2018   Procedure: LEFT KNEE ARTHROSCOPY WITH PARTIAL MEDIAL MENISECTOMY, ARTHROSCOPIC INTERNAL FIXATION MEDIAL FEMORAL CONDYLE AND MEDIAL TIBIAL PLATEAU, CHONDROPLASTY;  Surgeon: Eugenia Mcalpine, MD;  Location: [facility];  Service: Orthopedics;  Laterality: Left;   SUBOCCIPITAL CRANIECTOMY CERVICAL LAMINECTOMY  11-30-2000  DR NUDALMAN   C1 AND PARTIAL C2--   CHIARI  MALFORMATION   TOTAL KNEE ARTHROPLASTY Left 07/20/2019   Procedure: TOTAL KNEE ARTHROPLASTY;  Surgeon: Eugenia Mcalpine, MD;  Location: [facility];  Service: Orthopedics;  Laterality: Left;  with adductor canal   ULNAR NERVE TRANSPOSITION Bilateral 2022   left then right (Dr Amanda Pea)   URETEROSCOPY  03/02/2012   Procedure: URETEROSCOPY;  Surgeon: Marcine Matar, MD;  Location: [facility];  Service: Urology;  Laterality: Left;  stone obtained   Social History   Social History Narrative   Caffeine: 30oz mountain dew   Lives with wife, 1 daughter, 1 step daughter, 2 dogs   Occupation: Art gallery manager for [employer]   Activity: no regular exercise, trouble with back pain   Diet: good, vegetables.   Immunization History  Administered Date(s) Administered   Influenza Split 07/21/2013   Influenza,inj,Quad PF,6+ Mos 07/10/2015, 06/27/2019, 07/27/2021, 07/26/2022   Influenza-Unspecified 06/30/2017   Zoster Recombinant(Shingrix) 07/27/2021, 02/01/2022     Objective: Vital Signs: BP (!) 144/88 (BP Location: Right Arm, Patient Position: Sitting, Cuff Size: Large)   Pulse 61   Resp 17   Ht 6\' 2"  (1.88 m)   Wt 291 lb 12.8 oz (132.4 kg)   BMI 37.46 kg/m    Physical Exam Vitals and nursing note reviewed.  Constitutional:      Appearance: He is well-developed.  HENT:     Head: Normocephalic and atraumatic.  Eyes:     Conjunctiva/sclera: Conjunctivae normal.     Pupils: Pupils are equal, round, and reactive to light.  Cardiovascular:     Rate and Rhythm: Normal rate and regular rhythm.     Heart sounds: Normal heart sounds.  Pulmonary:     Effort: Pulmonary effort is normal.     Breath sounds: Normal breath sounds.  Abdominal:     General: Bowel sounds are normal.     Palpations: Abdomen is soft.  Musculoskeletal:     Cervical back: Normal range of motion and neck supple.  Skin:    General: Skin is warm and dry.     Capillary Refill: Capillary refill takes less than 2 seconds.  Neurological:     Mental Status: He is alert and oriented to person, place, and time.  Psychiatric:        Behavior: Behavior normal.      Musculoskeletal Exam: He had limited lateral rotation of the cervical spine.  He had surgical scar on his C-spine.  Thoracic spine had good mobility.  He had limited range of motion of his lumbar spine with  discomfort.  He had no discomfort on palpation over SI joints  but discomfort with mobility over the SI joints.  Shoulder joints were in good range of motion.  He about 5 degree contracture in bilateral elbows without any synovitis.  He had bilateral PIP and DIP thickening.  No synovitis was noted over MCPs or wrist joints.  Hip joints were in good range of motion.  Knee joints were in good range of motion.  Left knee joint was replaced.  There was no tenderness or synovitis over ankles or MTPs.  There was no plantar fasciitis or Achilles tendinitis  CDAI Exam: CDAI Score: -- Patient Global: --; Provider Global: -- Swollen: --; Tender: -- Joint Exam 05/31/2023   No joint exam has been documented for this visit   There is currently no information documented on the homunculus. Go to the Rheumatology activity and complete the homunculus joint exam.  Investigation: No additional findings.  Imaging: No results found.  Recent Labs: Lab Results  Component Value Date   WBC 9.1 11/22/2022   HGB 14.4 11/22/2022   PLT 225.0 11/22/2022   NA 139 11/22/2022   K 4.0 11/22/2022   CL 102 11/22/2022   CO2 27 11/22/2022   GLUCOSE 93 11/22/2022   BUN 19 11/22/2022   CREATININE 1.26 11/22/2022   BILITOT 0.3 11/22/2022   ALKPHOS 66 11/22/2022   AST 12 11/22/2022   ALT 10 11/22/2022   PROT 6.6 11/22/2022   ALBUMIN 3.9 11/22/2022   CALCIUM 9.4 11/22/2022   GFRAA >60 07/20/2019   November 22, 2022 ESR 21, CRP 1.0 ANA negative, HLA-B27 negative  Speciality Comments: No specialty comments available.  Procedures:  No procedures performed Allergies: Ibuprofen   Assessment / Plan:     Visit Diagnoses: Polyarthralgia-patient complains of pain and discomfort in multiple joints since he was in his 30s.  He continues to have pain and discomfort in multiple joints.  He is on multiple medications including Celebrex, methocarbamol, oxycodone and gabapentin.  No synovitis was noted on the examination today.  I  did not notice any symptoms on examination.  There is no history of recurrent plantar fasciitis, Achilles tendinitis, psoriasis or inflammatory bowel disease.  I advised him to contact me if he develops any joint swelling.  I do not have any previous x-rays to review.  The x-rays we have in our system are old and are consistent with degenerative disc disease.  DDD (degenerative disc disease), cervical-I do not have x-rays to review.  He has been under care of Dr. Newell Coral in the past and then Dr. Shon Baton.  He had surgery for Chiari malformation by Dr. Newell Coral in the past.  He had limited lateral rotation of his cervical spine.  Degeneration of lumbar intervertebral disc-he has chronic discomfort in his lower back.  He had limited mobility in his lumbar spine.  He had lumbar spine fusion in 2015.  He had laminectomy in 2012.  He had limited mobility in his lumbar spine.  Lumbar post-laminectomy syndrome-he continues to have lower extremity discomfort related to lower back pain.  He has been followed by Dr. Ethelene Hal.  Chronic right SI joint pain-he has been experiencing chronic SI joint pain.  I do not have x-rays to review.  Most of his [facility]-rays are done at [facility].  His sed rate was normal and HLA-B27 was negative.  He states SI joint pain is related to walking and doing activities.  He had no tenderness over SI joints.  I offered reviewing his x-rays from [facility] if he will bring a copy to me.  Bilateral carpal tunnel syndrome-patient had bilateral carpal tunnel syndrome in 2022 by Dr. Amanda Pea.  He has intermittent stiffness in his hands.  No synovitis was noted.  Contracture of joint of both elbows-no synovitis was noted.  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  He still works for [employer] but is not on his elbows and knees.  He had ulnar nerve release by Dr. Cheree Ditto back in 2022.  S/P left knee arthroscopy-he underwent  left total knee replacement by Dr. Thomasena Edis in 2020.  No warmth swelling or effusion was noted.  Other medical problems are listed as follows:  Mixed hyperlipidemia  Paroxysmal atrial fibrillation (HCC)  Kidney stones  Chronic, continuous use of opioids  Other polyneuropathy  MDD (major depressive disorder), recurrent episode, moderate (HCC)  History of gastroesophageal reflux (GERD)  OSA on CPAP  Smoker - 1PPDx30 years  Orders: No orders of the defined types were placed in this encounter.  No orders of the defined types were placed in this encounter.    Follow-Up Instructions: Return if symptoms worsen or fail to improve, for Osteoarthritis.   Pollyann Savoy, MD  Note - This record has been created using Animal nutritionist.  Chart creation errors have been sought, but may not always  have been located. Such creation errors do not reflect on  the standard of medical care.

## 2023-05-24 ENCOUNTER — Telehealth: Payer: Self-pay | Admitting: Adult Health

## 2023-05-24 NOTE — Telephone Encounter (Signed)
Pt calling in to get a Dot Cpap compliance Appt but first avail is 06/28/2023

## 2023-05-25 ENCOUNTER — Encounter: Payer: 59 | Admitting: Rheumatology

## 2023-05-26 NOTE — Telephone Encounter (Signed)
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

## 2023-05-27 NOTE — Telephone Encounter (Signed)
Pt has not been seen since 2020. Upcoming ov 10/8. At this time we no longer manages pts cpap

## 2023-05-31 ENCOUNTER — Ambulatory Visit: Payer: 59 | Attending: Rheumatology | Admitting: Rheumatology

## 2023-05-31 ENCOUNTER — Encounter: Payer: Self-pay | Admitting: Rheumatology

## 2023-05-31 VITALS — BP 144/88 | HR 61 | Resp 17 | Ht 74.0 in | Wt 291.8 lb

## 2023-05-31 DIAGNOSIS — E782 Mixed hyperlipidemia: Secondary | ICD-10-CM

## 2023-05-31 DIAGNOSIS — M24522 Contracture, left elbow: Secondary | ICD-10-CM

## 2023-05-31 DIAGNOSIS — M2550 Pain in unspecified joint: Secondary | ICD-10-CM | POA: Diagnosis not present

## 2023-05-31 DIAGNOSIS — M5030 Other cervical disc degeneration, unspecified cervical region: Secondary | ICD-10-CM | POA: Diagnosis not present

## 2023-05-31 DIAGNOSIS — F17200 Nicotine dependence, unspecified, uncomplicated: Secondary | ICD-10-CM

## 2023-05-31 DIAGNOSIS — I480 Paroxysmal atrial fibrillation: Secondary | ICD-10-CM

## 2023-05-31 DIAGNOSIS — M24521 Contracture, right elbow: Secondary | ICD-10-CM

## 2023-05-31 DIAGNOSIS — M533 Sacrococcygeal disorders, not elsewhere classified: Secondary | ICD-10-CM

## 2023-05-31 DIAGNOSIS — F331 Major depressive disorder, recurrent, moderate: Secondary | ICD-10-CM

## 2023-05-31 DIAGNOSIS — N200 Calculus of kidney: Secondary | ICD-10-CM

## 2023-05-31 DIAGNOSIS — Z9889 Other specified postprocedural states: Secondary | ICD-10-CM

## 2023-05-31 DIAGNOSIS — M1712 Unilateral primary osteoarthritis, left knee: Secondary | ICD-10-CM

## 2023-05-31 DIAGNOSIS — M961 Postlaminectomy syndrome, not elsewhere classified: Secondary | ICD-10-CM

## 2023-05-31 DIAGNOSIS — G4733 Obstructive sleep apnea (adult) (pediatric): Secondary | ICD-10-CM

## 2023-05-31 DIAGNOSIS — F1190 Opioid use, unspecified, uncomplicated: Secondary | ICD-10-CM

## 2023-05-31 DIAGNOSIS — M25521 Pain in right elbow: Secondary | ICD-10-CM

## 2023-05-31 DIAGNOSIS — M5136 Other intervertebral disc degeneration, lumbar region: Secondary | ICD-10-CM | POA: Diagnosis not present

## 2023-05-31 DIAGNOSIS — Z8719 Personal history of other diseases of the digestive system: Secondary | ICD-10-CM

## 2023-05-31 DIAGNOSIS — G6289 Other specified polyneuropathies: Secondary | ICD-10-CM

## 2023-05-31 DIAGNOSIS — G8929 Other chronic pain: Secondary | ICD-10-CM

## 2023-05-31 DIAGNOSIS — G5603 Carpal tunnel syndrome, bilateral upper limbs: Secondary | ICD-10-CM

## 2023-06-15 ENCOUNTER — Ambulatory Visit: Payer: 59 | Admitting: Rheumatology

## 2023-06-28 ENCOUNTER — Institutional Professional Consult (permissible substitution): Payer: 59 | Admitting: Adult Health

## 2023-07-28 ENCOUNTER — Other Ambulatory Visit: Payer: Self-pay | Admitting: Student

## 2023-08-10 ENCOUNTER — Ambulatory Visit: Payer: 59 | Admitting: Adult Health

## 2023-08-10 ENCOUNTER — Encounter: Payer: Self-pay | Admitting: Adult Health

## 2023-08-10 VITALS — BP 146/88 | HR 76 | Temp 97.8°F | Ht 75.0 in | Wt 288.0 lb

## 2023-08-10 DIAGNOSIS — F17200 Nicotine dependence, unspecified, uncomplicated: Secondary | ICD-10-CM

## 2023-08-10 DIAGNOSIS — G4733 Obstructive sleep apnea (adult) (pediatric): Secondary | ICD-10-CM | POA: Diagnosis not present

## 2023-08-10 NOTE — Patient Instructions (Addendum)
Order for new CPAP  Order for new CPAP supplies  Continue on CPAP At bedtime   Work on weight loss.  Do not drive if sleepy Follow up Dr. Vassie Loll  In 1 year and As needed

## 2023-08-10 NOTE — Assessment & Plan Note (Signed)
Smoking cessation discussed in detail 

## 2023-08-10 NOTE — Progress Notes (Signed)
@Patient  ID: [name], male    DOB: 1971-07-05, 52 y.o.   MRN: [id number]  Chief Complaint  Patient presents with   Consult    Referring provider: Eustaquio Boyden, MD  HPI: 52 year old male followed for obstructive sleep apnea on CPAP. Reestablish August 10, 2023 Medical history significant for atrial fibrillation,Arnold-Chiari surgery and spina bifida   TEST/EVENTS :  NPSG 2007:  AHI 76/hr, and cpap optimized to 15cm   08/10/2023 Follow up ; OSA -reestablish Patient presents for a follow-up visit for sleep apnea.  He was last seen October 2020.  Patient has severe sleep apnea is on CPAP therapy.  Patient says he continues to do well on CPAP.  He wears a CPAP every single night cannot sleep without it.  Feels that he benefits from CPAP.  Typically goes to sleep about 9:30 PM.  Gets up at 5 AM.  Goes to sleep pretty quickly.  CPAP download shows excellent compliance with 100% usage.  Daily average usage at 7 hours.  Patient is on CPAP 15 cm H2O.  AHI 3.2/hour.  He does need new supplies sent to his homecare company.  Says that his mask is pretty old, Epworth score is 4 out of 24.  Gets some sleepiness if he sits down but overall feels that CPAP helps him with decreased daytime sleepiness.  Patient says his CPAP machine is getting old.  Needs a new machine.  Social history patient is married lives at home with his wife.  Works full-time.  Has children.  He does smoke.  No alcohol or drug use.   . Family history positive for cancer.   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   Colon polyps 2011   rpt Q5 yrs   DDD (degenerative disc disease) SPINE   History of kidney stones    HLD (hyperlipidemia)    Internal hemorrhoids    Kidney stone    OSA on CPAP DR CLANCE   CPAP   Paroxysmal A-fib (HCC) CARDIOLOGIST-  DR [name]-- LAST VISIT 10-22-2010 IN [facility]   controlled with flecainide 300mg  prn- this medication is out of date   Seasonal allergies    Sleep apnea    wears CPAP    Tobacco History: Social History   Tobacco Use  Smoking Status Every Day   Current packs/day: 1.00   Average packs/day: 1 pack/day for 36.2 years (36.2 ttl pk-yrs)   Types: Cigarettes   Start date: 05/31/1987   Passive exposure: Current  Smokeless Tobacco Never  Tobacco Comments   pack per day 04/17/21   Ready to quit: Not Answered Counseling given: Not Answered Tobacco comments: pack per day 04/17/21   Outpatient Medications Prior to Visit  Medication Sig Dispense Refill   Acetaminophen 500 MG capsule Take 1,000 mg by mouth every 6 (six) hours as needed for headache.     celecoxib (CELEBREX) 200 MG capsule Take 200 mg by mouth daily.     cyclobenzaprine (FLEXERIL) 10 MG tablet Take 10 mg by mouth at bedtime as needed.     flecainide (TAMBOCOR) 100 MG tablet Take 1 tablet (100  mg total) by mouth 2 (two) times daily. 30 tablet 0   gabapentin (NEURONTIN) 600 MG tablet Take 600 mg by mouth 3 (three) times daily.     metoprolol succinate (TOPROL-XL) 25 MG 24 hr tablet TAKE 1 TABLET BY MOUTH AT BEDTIME 90 tablet 1   oxyCODONE (OXY IR/ROXICODONE) 5 MG immediate release tablet Take 5 mg by mouth 2 (two) times daily as needed for severe pain or moderate pain.     levocetirizine (XYZAL) 5 MG tablet Take 1 tablet (5 mg total) by mouth every evening. (Patient not taking: Reported on 05/31/2023) 30 tablet 0   methocarbamol (ROBAXIN) 500 MG tablet Take 500 mg by mouth 3 (three) times daily as needed for muscle spasms. (Patient not taking: Reported on 08/10/2023)      No facility-administered medications prior to visit.     Review of Systems:   Constitutional:   No  weight loss, night sweats,  Fevers, chills, fatigue, or  lassitude.  HEENT:   No headaches,  Difficulty swallowing,  Tooth/dental problems, or  Sore throat,                No sneezing, itching, ear ache, nasal congestion, post nasal drip,   CV:  No chest pain,  Orthopnea, PND, swelling in lower extremities, anasarca, dizziness, palpitations, syncope.   GI  No heartburn, indigestion, abdominal pain, nausea, vomiting, diarrhea, change in bowel habits, loss of appetite, bloody stools.   Resp: No shortness of breath with exertion or at rest.  No excess mucus, no productive cough,  No non-productive cough,  No coughing up of blood.  No change in color of mucus.  No wheezing.  No chest wall deformity  Skin: no rash or lesions.  GU: no dysuria, change in color of urine, no urgency or frequency.  No flank pain, no hematuria   MS:  No joint pain or swelling.  No decreased range of motion.  No back pain.    Physical Exam  BP (!) 146/88   Pulse 76   Temp 97.8 F (36.6 C) (Temporal)   Ht 6\' 3"  (1.905 m)   Wt 288 lb (130.6 kg)   SpO2 98%   BMI 36.00 kg/m   GEN: A/Ox3; pleasant , NAD, well nourished    HEENT:  [place]/AT,  EACs-clear, TMs-wnl, NOSE-clear, THROAT-clear, no lesions, no postnasal drip or exudate noted.   NECK:  Supple w/ fair ROM; no JVD; normal carotid impulses w/o bruits; no thyromegaly or nodules palpated; no lymphadenopathy.    RESP  Clear  P & A; w/o, wheezes/ rales/ or rhonchi. no accessory muscle use, no dullness to percussion  CARD:  RRR, no m/r/g, no peripheral edema, pulses intact, no cyanosis or clubbing.  GI:   Soft & nt; nml bowel sounds; no organomegaly or masses detected.   Musco: Warm bil, no deformities or joint swelling noted.   Neuro: alert, no focal deficits noted.    Skin: Warm, no lesions or rashes    Lab Results:  CBC    Component Value  Date/Time   WBC 9.1 11/22/2022 1458   RBC 4.77 11/22/2022 1458   HGB 14.4 11/22/2022 1458   HCT 42.2 11/22/2022 1458   PLT 225.0 11/22/2022 1458   MCV 88.6 11/22/2022 1458   MCH 28.3 04/01/2021 1535   MCHC 34.2 11/22/2022 1458   RDW 14.3 11/22/2022 1458   LYMPHSABS 2.8 11/22/2022 1458   MONOABS 0.6 11/22/2022 1458   EOSABS 0.1 11/22/2022 1458   BASOSABS 0.1  11/22/2022 1458    BMET    Component Value Date/Time   NA 139 11/22/2022 1458   NA 138 05/01/2021 0952   K 4.0 11/22/2022 1458   CL 102 11/22/2022 1458   CO2 27 11/22/2022 1458   GLUCOSE 93 11/22/2022 1458   BUN 19 11/22/2022 1458   BUN 15 05/01/2021 0952   CREATININE 1.26 11/22/2022 1458   CALCIUM 9.4 11/22/2022 1458   GFRNONAA >60 04/01/2021 1535   GFRAA >60 07/20/2019 1129    BNP No results found for: "BNP"  ProBNP    Component Value Date/Time   PROBNP 15.0 06/26/2019 1627    Imaging: No results found.  Administration History     None           No data to display          No results found for: "NITRICOXIDE"      Assessment & Plan:   OSA on CPAP Excellent control and compliance on nocturnal CPAP.  Continue on current setting.  Order for new CPAP and new CPAP supplies sent to DME  - discussed how weight can impact sleep and risk for sleep disordered breathing - discussed options to assist with weight loss: combination of diet modification, cardiovascular and strength training exercises   - had an extensive discussion regarding the adverse health consequences related to untreated sleep disordered breathing - specifically discussed the risks for hypertension, coronary artery disease, cardiac dysrhythmias, cerebrovascular disease, and diabetes - lifestyle modification discussed   - discussed how sleep disruption can increase risk of accidents, particularly when driving - safe driving practices were discussed   Plan  Patient Instructions  Order for new CPAP  Order for new CPAP  supplies  Continue on CPAP At bedtime   Work on weight loss.  Do not drive if sleepy Follow up Dr. Vassie Loll  In 1 year and As needed      Smoker Smoking cessation discussed in detail            Rubye Oaks, NP 08/10/2023

## 2023-08-10 NOTE — Assessment & Plan Note (Signed)
Excellent control and compliance on nocturnal CPAP.  Continue on current setting.  Order for new CPAP and new CPAP supplies sent to DME  - discussed how weight can impact sleep and risk for sleep disordered breathing - discussed options to assist with weight loss: combination of diet modification, cardiovascular and strength training exercises   - had an extensive discussion regarding the adverse health consequences related to untreated sleep disordered breathing - specifically discussed the risks for hypertension, coronary artery disease, cardiac dysrhythmias, cerebrovascular disease, and diabetes - lifestyle modification discussed   - discussed how sleep disruption can increase risk of accidents, particularly when driving - safe driving practices were discussed   Plan  Patient Instructions  Order for new CPAP  Order for new CPAP supplies  Continue on CPAP At bedtime   Work on weight loss.  Do not drive if sleepy Follow up Dr. Vassie Loll  In 1 year and As needed

## 2023-09-13 ENCOUNTER — Other Ambulatory Visit: Payer: Self-pay | Admitting: Internal Medicine

## 2023-11-08 ENCOUNTER — Other Ambulatory Visit (HOSPITAL_COMMUNITY): Payer: Self-pay

## 2023-11-08 ENCOUNTER — Telehealth: Payer: Self-pay | Admitting: Internal Medicine

## 2023-11-08 MED ORDER — METOPROLOL SUCCINATE ER 25 MG PO TB24: 25.0000 mg | ORAL_TABLET | Freq: Every day | ORAL | 0 refills | Status: DC

## 2023-11-08 MED ORDER — METOPROLOL SUCCINATE ER 25 MG PO TB24
25.0000 mg | ORAL_TABLET | Freq: Every day | ORAL | 0 refills | Status: DC
  Filled 2023-11-08: qty 90, 90d supply, fill #0

## 2023-11-08 NOTE — Telephone Encounter (Signed)
*  STAT* If patient is at the pharmacy, call can be transferred to refill team.   1. Which medications need to be refilled? (please list name of each medication and dose if known) metoprolol succinate (TOPROL-XL) 25 MG 24 hr tablet   2. Which pharmacy/location (including street and city if local pharmacy) is medication to be sent to? [facility] - [place], [place] - [address] [facility] AT Portal to Registered [facility] Sites   3. Do they need a 30 day or 90 day supply? 90  Patient has been out of this medication for a week.

## 2023-11-08 NOTE — Telephone Encounter (Signed)
 Pt's medication was sent to pt's pharmacy as requested. Confirmation received.

## 2024-01-02 ENCOUNTER — Encounter: Payer: Self-pay | Admitting: Family Medicine

## 2024-01-02 ENCOUNTER — Ambulatory Visit: Admitting: Family Medicine

## 2024-01-02 ENCOUNTER — Ambulatory Visit: Payer: Self-pay

## 2024-01-02 VITALS — BP 144/82 | HR 85 | Temp 99.4°F | Ht 75.0 in | Wt 294.1 lb

## 2024-01-02 DIAGNOSIS — U071 COVID-19: Secondary | ICD-10-CM

## 2024-01-02 DIAGNOSIS — R509 Fever, unspecified: Secondary | ICD-10-CM | POA: Diagnosis not present

## 2024-01-02 DIAGNOSIS — F17200 Nicotine dependence, unspecified, uncomplicated: Secondary | ICD-10-CM

## 2024-01-02 HISTORY — DX: COVID-19: U07.1

## 2024-01-02 LAB — POC COVID19 BINAXNOW: SARS Coronavirus 2 Ag: POSITIVE — AB

## 2024-01-02 LAB — POCT INFLUENZA A/B
Influenza A, POC: NEGATIVE
Influenza B, POC: NEGATIVE

## 2024-01-02 MED ORDER — MOLNUPIRAVIR EUA 200MG CAPSULE: 4.0000 | ORAL_CAPSULE | Freq: Two times a day (BID) | ORAL | 0 refills | Status: AC

## 2024-01-02 NOTE — Telephone Encounter (Signed)
 Noted. Appreciate Dr Malissa Se seeing pt.

## 2024-01-02 NOTE — Assessment & Plan Note (Addendum)
 With fever and body aches/ starting yesterday in a smoker w/o history of copd Positive covid test / neg flu   Reassuring exam  Has not experienced any respiratory symptoms yet  Discussed anti viral- prescription molnupiravir (handout given) , increased risk factors of age and smoking, obesity and  heart problem (a fib) Cannot take paxlovid with flecainide   Disc symptomatic care - see instructions on AVS  Discussed isolation /masking recommendations   Update if not starting to improve in a week or if worsening  Call back and Er precautions noted in detail today

## 2024-01-02 NOTE — Patient Instructions (Addendum)
 Treat your symptoms  Drink fluids and rest  mucinex  is good for cough and congestion  Nasal saline for congestion as needed  Tylenol for fever or pain or headache  Please alert us  if symptoms worsen (if severe or short of breath please go to the ER)   Take the molnupiravir as directed   Think about quitting smoking    Isolate a minimum of 5 days - longer if symptoms persist  Then wear a mask for an additional 10 days

## 2024-01-02 NOTE — Telephone Encounter (Signed)
 Copied from CRM [id number]. Topic: Clinical - Red Word Triage >> Jan 02, 2024  9:48 AM Oddis Bench wrote: Red Word that prompted transfer to Nurse Triage: Patient is stating that he had a low grade fever that has went away this morning had sweats and cold, he also has a pain, in neck, head, back , both arms and legs. He is also has inflamation and he had diahrhea.  Chief Complaint: fever Symptoms: low grade fever, head, neck and back pain, arm/leg pain, all subsided today just feels fatigued  Frequency: yesterday  Pertinent Negatives: Patient denies fever today  Disposition: [] ED /[] Urgent Care (no appt availability in office) / [x] Appointment(In office/virtual)/ []  [facility]/ [] Home Care/ [] Refused Recommended Disposition /[] [place] [place] [facility]/ []  Follow-up with PCP Additional Notes: pt states he went fishing yesterday and started feeling bad on the way home, felt like had low grade fever d/t cold and chills. Had all over pain. Took Tylenol this morning and fever broke. Also took Oxycodone 5 mg for the pain and states he feels better just extreme fatigue. Has chronic back pain as well. Pt concerned about random sx and wife suggested he call. No appts were available with PCP office so offered appt with [facility], spoke with [place], [place] and she offered appt for today at 1140, in meantime appt at 3pm came available with Dr. Malissa Se so offered pt that appt as well. Pt accepted 3pm with [facility] today.   Reason for Disposition  [1] Fever > 100.0 F (37.8 C) AND [2] diabetes mellitus or weak immune system (e.g., HIV positive, cancer chemo, splenectomy, organ transplant, chronic steroids)  Answer Assessment - Initial Assessment Questions 1. TEMPERATURE: "What is the most recent temperature?"  "How was it measured?"      Unsure felt low grade  2. ONSET: "When did the fever start?"      Yesterday  3. CHILLS: "Do you have chills?" If yes: "How bad are they?"  (e.g., none, mild, moderate,  severe)   - NONE: no chills   - MILD: feeling cold   - MODERATE: feeling very cold, some shivering (feels better under a thick blanket)   - SEVERE: feeling extremely cold with shaking chills (general body shaking, rigors; even under a thick blanket)      Yes, mild  4. OTHER SYMPTOMS: "Do you have any other symptoms besides the fever?"  (e.g., abdomen pain, cough, diarrhea, earache, headache, sore throat, urination pain)     Had head neck and back leg, had loose stools, feeling better just fatigued  7. TREATMENT: "What have you done so far to treat this fever?" (e.g., medications)     Tylenol this morning and started sweating  Protocols used: [facility]

## 2024-01-02 NOTE — Progress Notes (Signed)
 Subjective:    Patient ID: [name], male    DOB: [birth year], 53 y.o.   MRN: [id number]  HPI  Wt Readings from Last 3 Encounters:  01/02/24 294 lb 2 oz (133.4 kg)  08/10/23 288 lb (130.6 kg)  05/31/23 291 lb 12.8 oz (132.4 kg)   36.76 kg/m  Vitals:   01/02/24 1454  BP: (!) 144/82  Pulse: 85  Temp: 99.4 F (37.4 C)  SpO2: 98%    53 yo pt of dr Crissie Dome presents with fever and body aches  Just took tylenol at 11:30   Covid test is positive today   Symptoms started yesterday afternoon  Headache  Neck and back aches  No cough  Allergies-runny/stuffynose  No ST  Ears are ok   Some loose stool  No n/v   No change in taste   Smokes about 1ppd  No copd  Does not tend to wheeze   Over the counter  Tylenol   Results for orders placed or performed in visit on 01/02/24  POC COVID-19   Collection Time: 01/02/24  3:10 PM  Result Value Ref Range   SARS Coronavirus 2 Ag Positive (A) Negative  POCT Influenza A/B   Collection Time: 01/02/24  3:20 PM  Result Value Ref Range   Influenza A, POC Negative Negative   Influenza B, POC Negative Negative      Lab Results  Component Value Date   NA 139 11/22/2022   K 4.0 11/22/2022   CO2 27 11/22/2022   GLUCOSE 93 11/22/2022   BUN 19 11/22/2022   CREATININE 1.26 11/22/2022   CALCIUM 9.4 11/22/2022   GFR 64.86 11/22/2022   EGFR 87 05/01/2021   GFRNONAA >60 04/01/2021   Lab Results  Component Value Date   ALT 10 11/22/2022   AST 12 11/22/2022   ALKPHOS 66 11/22/2022   BILITOT 0.3 11/22/2022      Patient Active Problem List   Diagnosis Date Noted   COVID-19 01/02/2024   Elevated blood pressure reading 12/22/2022   Low testosterone in male 11/25/2022   Chronic fatigue 11/19/2022   Subacute cough 10/01/2022   Encounter for general adult medical examination with abnormal findings 07/27/2021   MDD (major depressive disorder), recurrent episode, moderate (HCC) 07/27/2021   Peripheral neuropathy 07/27/2021    Sacroiliac joint pain 07/25/2021   Polyarthralgia 06/09/2021   Ulnar nerve compression 03/18/2021   Bilateral carpal tunnel syndrome 03/09/2021   Disorder of both ulnar nerves 03/04/2021   Bilateral elbow joint pain 03/04/2021   Nicotine dependence 03/04/2021   Lumbar radiculopathy 01/27/2021   Cervical radiculopathy 01/22/2021   DDD (degenerative disc disease), cervical 11/13/2019   Family history of premature CAD 06/26/2019   Degeneration of lumbar intervertebral disc 01/02/2019   Lumbar post-laminectomy syndrome 01/02/2019   S/P left knee arthroscopy 08/24/2018   Osteoarthritis of left knee 07/26/2018   History of spinal arthrodesis 09/26/2014   Foraminal stenosis of lumbar region 05/31/2014   Lumbosacral radiculopathy at L5 05/31/2014   Chronic, continuous use of opioids 05/17/2014   Kidney stones 01/19/2012   Preoperative clearance 09/10/2011   Smoker 07/22/2011   Left groin pain 04/27/2011   DEGENERATIVE DISC DISEASE, LUMBOSACRAL SPINE W/RADICULOPATHY 09/22/2010   GERD 04/09/2010   OSA on CPAP 03/16/2010   BACK PAIN, THORACIC REGION 02/24/2010   Chronic low back pain with sciatica 02/24/2010   HLD (hyperlipidemia) 01/15/2009   ATRIAL FIBRILLATION, PAROXYSMAL 01/15/2009   Past Medical History:  Diagnosis Date   Arnold-Chiari malformation (  HCC) 2002   s/p decompression   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   Colon polyps 2011   rpt Q5 yrs   DDD (degenerative disc disease) SPINE   History of kidney stones    HLD (hyperlipidemia)    Internal hemorrhoids    Kidney stone    OSA on CPAP DR CLANCE   CPAP   Paroxysmal A-fib (HCC) CARDIOLOGIST-  DR [name]-- LAST VISIT 10-22-2010 IN [facility]   controlled with flecainide 300mg  prn- this medication is out of date   Seasonal allergies    Sleep apnea    wears CPAP   Past Surgical History:  Procedure Laterality Date   ADENOIDECTOMY  AGE 21   ANTERIOR LUMBAR DISKECTOMY / DECOMPRESSION   10/08/2010   L5 - S1, LEFT  SIDE ([place])   ANTERIOR LUMBAR FUSION  07/2014   L4/5, L5/S1 [facility]   CARDIOVERSION  1996, 2008   x2 in 1996   CARDIOVERSION N/A 03/17/2021   Procedure: CARDIOVERSION;  Surgeon: Elease Hashimoto Deloris Ping, MD;  Location: [facility] ENDOSCOPY;  Service: Cardiovascular;  Laterality: N/A;   CARPAL TUNNEL RELEASE Bilateral 2022   first left then right (Dr Amanda Pea)   COLONOSCOPY  2011   polyp, rec rpt 5 yrs   COLONOSCOPY  12/2019   int hem, rpt 10 yrs Marina Goodell)   CT chest noncontrast  06/2011   f/u L pulm nodule - gone.   HIATAL HERNIA REPAIR  1999   HIP ARTHROSCOPY  09/23/2011   LEFT HIP [facility]    KNEE ARTHROSCOPY WITH MEDIAL MENISECTOMY Left 08/24/2018   Procedure: LEFT KNEE ARTHROSCOPY WITH PARTIAL MEDIAL MENISECTOMY, ARTHROSCOPIC INTERNAL FIXATION MEDIAL FEMORAL CONDYLE AND MEDIAL TIBIAL PLATEAU, CHONDROPLASTY;  Surgeon: Eugenia Mcalpine, MD;  Location: [facility];  Service: Orthopedics;  Laterality: Left;   SUBOCCIPITAL CRANIECTOMY CERVICAL LAMINECTOMY  11-30-2000  DR NUDALMAN   C1 AND PARTIAL C2--   CHIARI  MALFORMATION   TOTAL KNEE ARTHROPLASTY Left 07/20/2019   Procedure: TOTAL KNEE ARTHROPLASTY;  Surgeon: Eugenia Mcalpine, MD;  Location: [facility];  Service: Orthopedics;  Laterality: Left;  with adductor canal   ULNAR NERVE TRANSPOSITION Bilateral 2022   left then right (Dr Amanda Pea)   URETEROSCOPY  03/02/2012   Procedure: URETEROSCOPY;  Surgeon: Marcine Matar, MD;  Location: [facility];  Service: Urology;  Laterality: Left;  stone obtained   Social History   Tobacco Use   Smoking status: Every Day    Current packs/day: 1.00    Average packs/day: 1 pack/day for 36.6 years (36.6 ttl pk-yrs)    Types: Cigarettes    Start date: 05/31/1987    Passive exposure: Current   Smokeless tobacco: Never   Tobacco comments:    pack per day 04/17/21  Vaping Use   Vaping status: Never Used  Substance Use Topics   Alcohol use: No   Drug use: No   Family  History  Problem Relation Age of Onset   Hypertension Mother    Heart attack Mother 30   Heart disease Mother    Coronary artery disease Mother 83   Atrial fibrillation Mother    Coronary artery disease Father 24       15v CABG   Heart disease Father 25       CABG   Diabetes Father    Alcohol abuse Father    Liver cancer Father 85   Cancer Paternal Uncle  lung, leukemia   Suicidality Paternal Uncle    Hemochromatosis Cousin    Liver disease Other        Grandmother (P or M not specified)   Healthy Daughter    Colon cancer Neg Hx    Esophageal cancer Neg Hx    Stomach cancer Neg Hx    Rectal cancer Neg Hx    Allergies  Allergen Reactions   Ibuprofen Swelling   Current Outpatient Medications on File Prior to Visit  Medication Sig Dispense Refill   Acetaminophen 500 MG capsule Take 1,000 mg by mouth every 6 (six) hours as needed for headache.     celecoxib (CELEBREX) 200 MG capsule Take 200 mg by mouth daily.     flecainide (TAMBOCOR) 100 MG tablet TAKE 1 TABLET TWICE A DAY 180 tablet 1   gabapentin (NEURONTIN) 600 MG tablet Take 600 mg by mouth 3 (three) times daily.     metoprolol succinate (TOPROL-XL) 25 MG 24 hr tablet Take 1 tablet (25 mg total) by mouth at bedtime. 90 tablet 0   oxyCODONE (OXY IR/ROXICODONE) 5 MG immediate release tablet Take 5 mg by mouth 2 (two) times daily as needed for severe pain or moderate pain.     No current facility-administered medications on file prior to visit.    Review of Systems  Constitutional:  Positive for chills, fatigue and fever. Negative for activity change, appetite change and unexpected weight change.  HENT:  Positive for postnasal drip and rhinorrhea. Negative for congestion, sore throat and trouble swallowing.   Eyes:  Negative for pain, redness, itching and visual disturbance.  Respiratory:  Negative for cough, chest tightness, shortness of breath and wheezing.   Cardiovascular:  Negative for chest pain and  palpitations.  Gastrointestinal:  Negative for abdominal pain, blood in stool, constipation, diarrhea and nausea.  Endocrine: Negative for cold intolerance, heat intolerance, polydipsia and polyuria.  Genitourinary:  Negative for difficulty urinating, dysuria, frequency and urgency.  Musculoskeletal:  Positive for myalgias. Negative for arthralgias and joint swelling.       Body aches  Skin:  Negative for pallor and rash.  Neurological:  Positive for headaches. Negative for dizziness, tremors, weakness and numbness.       Nerve pain worse than usual in arms and legs   Hematological:  Negative for adenopathy. Does not bruise/bleed easily.  Psychiatric/Behavioral:  Negative for decreased concentration and dysphoric mood. The patient is not nervous/anxious.        Objective:   Physical Exam Constitutional:      General: He is not in acute distress.    Appearance: Normal appearance. He is well-developed. He is obese. He is not ill-appearing or diaphoretic.  HENT:     Head: Normocephalic and atraumatic.     Right Ear: Tympanic membrane and ear canal normal.     Left Ear: Tympanic membrane and ear canal normal.     Ears:     Comments: Partial cerumen impaction bilaterally     Nose:     Comments: Boggy nares     Mouth/Throat:     Mouth: Mucous membranes are moist.     Pharynx: Oropharynx is clear. No oropharyngeal exudate or posterior oropharyngeal erythema.  Eyes:     General:        Right eye: No discharge.        Left eye: No discharge.     Conjunctiva/sclera: Conjunctivae normal.     Pupils: Pupils are equal, round, and reactive to light.  Neck:     Thyroid: No thyromegaly.     Vascular: No carotid bruit or JVD.  Cardiovascular:     Rate and Rhythm: Normal rate and regular rhythm.     Heart sounds: Normal heart sounds.     No gallop.  Pulmonary:     Effort: Pulmonary effort is normal. No respiratory distress.     Breath sounds: Normal breath sounds. No stridor. No wheezing,  rhonchi or rales.     Comments: Diffusely distant bs  No wheeze, even on forced exp  Abdominal:     General: There is no distension or abdominal bruit.     Palpations: Abdomen is soft.  Musculoskeletal:     Cervical back: Normal range of motion and neck supple.     Right lower leg: No edema.     Left lower leg: No edema.  Lymphadenopathy:     Cervical: No cervical adenopathy.  Skin:    General: Skin is warm and dry.     Coloration: Skin is not pale.     Findings: No rash.  Neurological:     Mental Status: He is alert.     Coordination: Coordination normal.     Deep Tendon Reflexes: Reflexes are normal and symmetric. Reflexes normal.  Psychiatric:        Mood and Affect: Mood normal.           Assessment & Plan:   Problem List Items Addressed This Visit       Other   Smoker   Pt currently has covid  Encouraged to consider quitting in setting of viral illness         COVID-19 - Primary   With fever and body aches/ starting yesterday in a smoker w/o history of copd Positive covid test / neg flu   Reassuring exam  Has not experienced any respiratory symptoms yet  Discussed anti viral- prescription molnupiravir (handout given) , increased risk factors of age and smoking, obesity and  heart problem (a fib) Cannot take paxlovid with flecainide   Disc symptomatic care - see instructions on AVS  Discussed isolation /masking recommendations   Update if not starting to improve in a week or if worsening  Call back and Er precautions noted in detail today        Relevant Medications   molnupiravir EUA (LAGEVRIO) 200 mg CAPS capsule   Other Visit Diagnoses       Fever, unspecified fever cause       Relevant Orders   POC COVID-19 (Completed)   POCT Influenza A/B (Completed)

## 2024-01-02 NOTE — Assessment & Plan Note (Signed)
 Pt currently has covid  Encouraged to consider quitting in setting of viral illness

## 2024-02-07 ENCOUNTER — Other Ambulatory Visit: Payer: Self-pay

## 2024-02-07 MED ORDER — METOPROLOL SUCCINATE ER 25 MG PO TB24
25.0000 mg | ORAL_TABLET | Freq: Every day | ORAL | 0 refills | Status: DC
Start: 2024-02-07 — End: 2024-07-09

## 2024-02-15 ENCOUNTER — Other Ambulatory Visit: Payer: Self-pay

## 2024-02-15 MED ORDER — FLECAINIDE ACETATE 100 MG PO TABS: 100.0000 mg | ORAL_TABLET | Freq: Two times a day (BID) | ORAL | 0 refills | Status: DC

## 2024-03-13 ENCOUNTER — Other Ambulatory Visit: Payer: Self-pay

## 2024-03-13 MED ORDER — FLECAINIDE ACETATE 100 MG PO TABS: 100.0000 mg | ORAL_TABLET | Freq: Two times a day (BID) | ORAL | 0 refills | Status: DC

## 2024-06-22 ENCOUNTER — Other Ambulatory Visit (HOSPITAL_COMMUNITY): Payer: Self-pay | Admitting: *Deleted

## 2024-06-22 MED ORDER — FLECAINIDE ACETATE 100 MG PO TABS: 100.0000 mg | ORAL_TABLET | Freq: Two times a day (BID) | ORAL | 0 refills | Status: DC

## 2024-07-09 ENCOUNTER — Encounter: Payer: Self-pay | Admitting: Student

## 2024-07-09 ENCOUNTER — Ambulatory Visit: Attending: Student | Admitting: Student

## 2024-07-09 VITALS — BP 140/86 | HR 72 | Ht 75.0 in | Wt 285.4 lb

## 2024-07-09 DIAGNOSIS — I4819 Other persistent atrial fibrillation: Secondary | ICD-10-CM | POA: Diagnosis not present

## 2024-07-09 DIAGNOSIS — I10 Essential (primary) hypertension: Secondary | ICD-10-CM | POA: Diagnosis not present

## 2024-07-09 MED ORDER — FLECAINIDE ACETATE 100 MG PO TABS: 100.0000 mg | ORAL_TABLET | Freq: Two times a day (BID) | ORAL | 3 refills | Status: AC

## 2024-07-09 MED ORDER — METOPROLOL SUCCINATE ER 25 MG PO TB24
25.0000 mg | ORAL_TABLET | Freq: Every day | ORAL | 3 refills | Status: AC
Start: 2024-07-09 — End: ?

## 2024-07-09 NOTE — Patient Instructions (Signed)
 Medication Instructions:  Your physician recommends that you continue on your current medications as directed. Please refer to the Current Medication list given to you today.  *If you need a refill on your cardiac medications before your next appointment, please call your pharmacy*  Lab Work: None ordered If you have labs (blood work) drawn today and your tests are completely normal, you will receive your results only by: MyChart Message (if you have MyChart) OR A paper copy in the mail If you have any lab test that is abnormal or we need to change your treatment, we will call you to review the results.  Follow-Up: At [facility], you and your health needs are our priority.  As part of our continuing mission to provide you with exceptional heart care, our providers are all part of one team.  This team includes your primary Cardiologist (physician) and Advanced Practice Providers or APPs (Physician Assistants and Nurse Practitioners) who all work together to provide you with the care you need, when you need it.  Your next appointment:   6 month(s)  Provider:   Bambi Lever "Michaelle Adolphus, PA-C

## 2024-07-09 NOTE — Progress Notes (Signed)
  Electrophysiology Office Note:   Date:  07/09/2024  ID:  [name], DOB [birth year], MRN [id number]  Primary Cardiologist: Jacob Shallow, MD Electrophysiologist: Jacob Sage, MD   Electrophysiologist:  Jacob Sage, MD      History of Present Illness:   [name] is a 53 y.o. male with h/o Persistent AF and HTN seen today for routine electrophysiology followup.   Since last being seen in our clinic the patient reports doing well. He has more issues with his back and knee than his heart. Rare, brief palpitations, but nothing sustained. Otherwise, he denies chest pain, dyspnea, PND, orthopnea, nausea, vomiting, dizziness, syncope, edema, weight gain, or early satiety.   Review of systems complete and found to be negative unless listed in HPI.   EP Information / Studies Reviewed:    EKG is ordered today. Personal review as below.  EKG Interpretation Date/Time:  Monday July 09 2024 09:54:37 EDT Ventricular Rate:  72 PR Interval:  152 QRS Duration:  110 QT Interval:  372 QTC Calculation: 407 R Axis:   61  Text Interpretation: Normal sinus rhythm Normal ECG Confirmed by Meyers Jacob [phone]) on 07/09/2024 9:58:28 AM    Arrhythmia/Device History No specialty comments available.   Physical Exam:   VS:  BP (!) 140/86 (BP Location: Left Arm, Patient Position: Sitting)   Pulse 72   Ht 6' 3 (1.905 m)   Wt 285 lb 6.4 oz (129.5 kg)   SpO2 98%   BMI 35.67 kg/m    Wt Readings from Last 3 Encounters:  07/09/24 285 lb 6.4 oz (129.5 kg)  01/02/24 294 lb 2 oz (133.4 kg)  08/10/23 288 lb (130.6 kg)     GEN: No acute distress NECK: No JVD; No carotid bruits CARDIAC: Regular rate and rhythm, no murmurs, rubs, gallops RESPIRATORY:  Clear to auscultation without rales, wheezing or rhonchi  ABDOMEN: Soft, non-tender, non-distended EXTREMITIES:  No edema; No deformity   ASSESSMENT AND PLAN:    Persistent AF EKG today shows NSR with stable intervals Continue flecainide   100 mg BID Resume toprol  25 mg daily. Not on OAC with CHA2DS2VASc of at least 1   HTN Stable on current regimen   Follow up with EP Team in 6 months. Stressed the importance of timely follow up  Signed, Jacob Prentice Lesia, PA-C

## 2024-07-30 ENCOUNTER — Ambulatory Visit: Payer: Self-pay

## 2024-07-30 NOTE — Telephone Encounter (Signed)
 FYI Only or Action Required?: FYI only for provider: appointment scheduled on 08/01/24.  Patient was last seen in primary care on 01/02/2024 by Randeen Laine LABOR, MD.  Called Nurse Triage reporting Cough.  Symptoms began several days ago.  Interventions attempted: Rest, hydration, or home remedies.  Symptoms are: unchanged.  Triage Disposition: See PCP When Office is Open (Within 3 Days)  Patient/caregiver understands and will follow disposition?: Yes      Reason for Disposition  [1] Continuous (nonstop) coughing interferes with work or school AND [2] no improvement using cough treatment per Care Advice    Scheduled patient on the next available AV appt on August 01, 2024 with PCP .  Answer Assessment - Initial Assessment Questions 1. ONSET: When did the cough begin?      Thursday 2. SEVERITY: How bad is the cough today?      *No Answer* 3. SPUTUM: Describe the color of your sputum (e.g., none, dry cough; clear, white, yellow, green)     yellow 4. HEMOPTYSIS: Are you coughing up any blood? If Yes, ask: How much? (e.g., flecks, streaks, tablespoons, etc.)     denies 5. DIFFICULTY BREATHING: Are you having difficulty breathing? If Yes, ask: How bad is it? (e.g., mild, moderate, severe)      denies 6. FEVER: Do you have a fever? If Yes, ask: What is your temperature, how was it measured, and when did it start?     Low grade fever 7. CARDIAC HISTORY: Do you have any history of heart disease? (e.g., heart attack, congestive heart failure)      *No Answer* 8. LUNG HISTORY: Do you have any history of lung disease?  (e.g., pulmonary embolus, asthma, emphysema)     *No Answer* 9. PE RISK FACTORS: Do you have a history of blood clots? (or: recent major surgery, recent prolonged travel, bedridden)     *No Answer* 10. OTHER SYMPTOMS: Do you have any other symptoms? (e.g., runny nose, wheezing, chest pain)       Head congestion  11. PREGNANCY: Is there any  chance you are pregnant? When was your last menstrual period?       *No Answer* 12. TRAVEL: Have you traveled out of the country in the last month? (e.g., travel history, exposures)       *No Answer*  Protocols used: Cough - Acute Productive-A-AH

## 2024-07-30 NOTE — Telephone Encounter (Signed)
 Will see in office then.

## 2024-08-01 ENCOUNTER — Encounter: Payer: Self-pay | Admitting: Family Medicine

## 2024-08-01 ENCOUNTER — Ambulatory Visit: Admitting: Family Medicine

## 2024-08-01 VITALS — BP 138/84 | HR 83 | Temp 99.4°F | Ht 75.0 in | Wt 269.0 lb

## 2024-08-01 DIAGNOSIS — R051 Acute cough: Secondary | ICD-10-CM | POA: Insufficient documentation

## 2024-08-01 DIAGNOSIS — F17200 Nicotine dependence, unspecified, uncomplicated: Secondary | ICD-10-CM | POA: Diagnosis not present

## 2024-08-01 DIAGNOSIS — R052 Subacute cough: Secondary | ICD-10-CM | POA: Insufficient documentation

## 2024-08-01 DIAGNOSIS — J441 Chronic obstructive pulmonary disease with (acute) exacerbation: Secondary | ICD-10-CM | POA: Diagnosis not present

## 2024-08-01 LAB — POC COVID19 BINAXNOW: SARS Coronavirus 2 Ag: NEGATIVE

## 2024-08-01 LAB — POCT INFLUENZA A/B
Influenza A, POC: NEGATIVE
Influenza B, POC: NEGATIVE

## 2024-08-01 MED ORDER — PREDNISONE 20 MG PO TABS: 40.0000 mg | ORAL_TABLET | Freq: Every day | ORAL | 0 refills | Status: DC

## 2024-08-01 MED ORDER — DOXYCYCLINE HYCLATE 100 MG PO TABS: 100.0000 mg | ORAL_TABLET | Freq: Two times a day (BID) | ORAL | 0 refills | Status: DC

## 2024-08-01 NOTE — Progress Notes (Signed)
 Ph: [phone] Fax: [phone]   Patient ID: [name], male    DOB: 1971-03-09, 53 y.o.   MRN: [id number]  This visit was conducted in person.  BP 138/84   Pulse 83   Temp 99.4 F (37.4 C) (Oral)   Ht 6' 3 (1.905 m)   Wt 269 lb (122 kg)   SpO2 96%   BMI 33.62 kg/m    CC: cough  Subjective:   HPI: [name] is a 53 y.o. male presenting on 08/01/2024 for Cough (X 5 days home Covid test was neg may have been expired )   6-7d h/o cough productive of thick yellow sputum, low grade fever, ST, PNDrainage, malaise worse in the afternoons, sweats when sleeping.  Trouble wearing CPAP due to congestion.   No dyspnea, wheezing, nausea, diarrhea, abd pain, ear or tooth pain.   Wife recently sick as well.   No h/o asthma 1 ppd smoker Home COVID test negative.   Known afib on Flecainide  and Toprol  XL  Worsening knee pain all this year - has had 2 knee steroid injections this year, latest visco-supplementation.  H/o R SIJ fusion fall 2024.  Over the past 3 months having worsening R back pain with R radiculopathy.  Seeing [facility] and Ramos, [facility].  Had MRI to lower back 04/2024.  He just had right L3/4 ESI earlier today.   He continues oxycodone  10mg  1/2 tab PRN through Dr Bonner. Also on celebrex  200mg  daily, gabapentin  600mg  BID.   Consider spinal cord stimulator implant.      Relevant past medical, surgical, family and social history reviewed and updated as indicated. Interim medical history since our last visit reviewed. Allergies and medications reviewed and updated. Outpatient Medications Prior to Visit  Medication Sig Dispense Refill   Acetaminophen  500 MG capsule Take 1,000 mg by mouth every 6 (six) hours as needed for headache.     celecoxib  (CELEBREX ) 200 MG capsule Take 200 mg by mouth daily.     flecainide  (TAMBOCOR ) 100 MG tablet Take 1 tablet (100 mg total) by mouth 2 (two) times daily. 180 tablet 3   gabapentin  (NEURONTIN ) 600 MG tablet Take 600  mg by mouth 3 (three) times daily. (Patient taking differently: Take 600 mg by mouth 2 (two) times daily.)     metoprolol  succinate (TOPROL -XL) 25 MG 24 hr tablet Take 1 tablet (25 mg total) by mouth at bedtime. 90 tablet 3   oxyCODONE  (OXY IR/ROXICODONE ) 5 MG immediate release tablet Take 5 mg by mouth 2 (two) times daily as needed for severe pain or moderate pain.     No facility-administered medications prior to visit.     Per HPI unless specifically indicated in ROS section below Review of Systems  Objective:  BP 138/84   Pulse 83   Temp 99.4 F (37.4 C) (Oral)   Ht 6' 3 (1.905 m)   Wt 269 lb (122 kg)   SpO2 96%   BMI 33.62 kg/m   Wt Readings from Last 3 Encounters:  08/01/24 269 lb (122 kg)  07/09/24 285 lb 6.4 oz (129.5 kg)  01/02/24 294 lb 2 oz (133.4 kg)      Physical Exam Vitals and nursing note reviewed.  Constitutional:      Appearance: Normal appearance. He is not ill-appearing.  HENT:     Head: Normocephalic and atraumatic.     Right Ear: Hearing, tympanic membrane, ear canal and external ear normal. There is no impacted cerumen.  Left Ear: Hearing, tympanic membrane, ear canal and external ear normal. There is no impacted cerumen.     Nose: Nose normal. No mucosal edema, congestion or rhinorrhea.     Right Turbinates: Not enlarged or swollen.     Left Turbinates: Not enlarged or swollen.     Right Sinus: No maxillary sinus tenderness or frontal sinus tenderness.     Left Sinus: No maxillary sinus tenderness or frontal sinus tenderness.     Mouth/Throat:     Mouth: Mucous membranes are moist.     Pharynx: Oropharynx is clear. No oropharyngeal exudate or posterior oropharyngeal erythema.  Eyes:     Extraocular Movements: Extraocular movements intact.     Conjunctiva/sclera: Conjunctivae normal.     Pupils: Pupils are equal, round, and reactive to light.  Cardiovascular:     Rate and Rhythm: Normal rate and regular rhythm.     Pulses: Normal pulses.      Heart sounds: Normal heart sounds. No murmur heard. Pulmonary:     Effort: Pulmonary effort is normal. No respiratory distress.     Breath sounds: Wheezing (diffusely, predominant LLL) present. No rhonchi or rales.  Musculoskeletal:     Cervical back: Normal range of motion and neck supple. No rigidity.     Right lower leg: No edema.     Left lower leg: No edema.  Lymphadenopathy:     Cervical: No cervical adenopathy.  Skin:    General: Skin is warm and dry.     Findings: No rash.  Neurological:     Mental Status: He is alert.  Psychiatric:        Mood and Affect: Mood normal.        Behavior: Behavior normal.       Results for orders placed or performed in visit on 08/01/24  POC COVID-19 BinaxNow   Collection Time: 08/01/24  2:30 PM  Result Value Ref Range   SARS Coronavirus 2 Ag Negative Negative  POCT Influenza A/B   Collection Time: 08/01/24  2:30 PM  Result Value Ref Range   Influenza A, POC Negative Negative   Influenza B, POC Negative Negative    Assessment & Plan:  He will return for physical.   Problem List Items Addressed This Visit     Smoker   Encouraged ongoing cessation attempts, reviewed risks of lung diease from ongoing smoking.       Acute cough - Primary   In longterm smoker with wheezing suspect acute bronchitis, discussed possibly developing COPD with acute exacerbation. Treat with prednisone 40mg  burst and doxycycline antibiotic course.  Update if not improving with treatment for CXR, further evaluation.  Pt agrees with plan.       Relevant Orders   POC COVID-19 BinaxNow (Completed)   POCT Influenza A/B (Completed)     Meds ordered this encounter  Medications   predniSONE (DELTASONE) 20 MG tablet    Sig: Take 2 tablets (40 mg total) by mouth daily with breakfast.    Dispense:  10 tablet    Refill:  0   doxycycline (VIBRA-TABS) 100 MG tablet    Sig: Take 1 tablet (100 mg total) by mouth 2 (two) times daily.    Dispense:  20 tablet     Refill:  0    Orders Placed This Encounter  Procedures   POC COVID-19 BinaxNow   POCT Influenza A/B    Patient Instructions  I think you have acute bronchitis with wheezing possibly due to smoking.  Treat with prednisone 40mg  daily for 5 days, doxycycline 100mg  twice daily for 10 days.  Push fluids and plenty of rest.  Let us  know if fever >101, worsening productive cough, or worsening symptoms instead of improving.  Return at your convenience for physical.  Follow up plan: Return if symptoms worsen or fail to improve.  Anton Blas, MD

## 2024-08-01 NOTE — Patient Instructions (Addendum)
 I think you have acute bronchitis with wheezing possibly due to smoking.  Treat with prednisone 40mg  daily for 5 days, doxycycline 100mg  twice daily for 10 days.  Push fluids and plenty of rest.  Let us  know if fever >101, worsening productive cough, or worsening symptoms instead of improving.  Return at your convenience for physical.

## 2024-08-01 NOTE — Assessment & Plan Note (Addendum)
 Encouraged ongoing cessation attempts, reviewed risks of lung diease from ongoing smoking.

## 2024-08-01 NOTE — Assessment & Plan Note (Addendum)
 In longterm smoker with wheezing suspect acute bronchitis, discussed possibly developing COPD with acute exacerbation. Treat with prednisone 40mg  burst and doxycycline antibiotic course.  Update if not improving with treatment for CXR, further evaluation.  Pt agrees with plan.

## 2024-08-10 ENCOUNTER — Ambulatory Visit: Payer: 59 | Admitting: Adult Health

## 2024-08-10 ENCOUNTER — Encounter: Payer: Self-pay | Admitting: Adult Health

## 2024-08-10 VITALS — BP 126/62 | HR 78 | Temp 98.0°F | Ht 75.0 in | Wt 276.0 lb

## 2024-08-10 DIAGNOSIS — G4733 Obstructive sleep apnea (adult) (pediatric): Secondary | ICD-10-CM | POA: Diagnosis not present

## 2024-08-10 DIAGNOSIS — I480 Paroxysmal atrial fibrillation: Secondary | ICD-10-CM

## 2024-08-10 DIAGNOSIS — F17200 Nicotine dependence, unspecified, uncomplicated: Secondary | ICD-10-CM | POA: Diagnosis not present

## 2024-08-10 DIAGNOSIS — Z6834 Body mass index (BMI) 34.0-34.9, adult: Secondary | ICD-10-CM

## 2024-08-10 NOTE — Progress Notes (Signed)
 @Patient  ID: [name], male    DOB: 1970/10/13, 53 y.o.   MRN: [id number]  Chief Complaint  Patient presents with   Obstructive Sleep Apnea    F/U    Referring provider: Rilla Baller, MD  HPI: 53 yo male followed for OSA     TEST/EVENTS : Reviewed 08/10/2024  NPSG 2007:  AHI 76/hr, and cpap optimized to 15cm   Discussed the use of AI scribe software for clinical note transcription with the patient, who gave verbal consent to proceed.  History of Present Illness [name] is a 53 year old male with severe sleep apnea who presents with difficulty using CPAP.   He experiences difficulty using his CPAP machine due to back and knee pain, which disrupts his sleep. He has been sleeping in a recliner for about three months and uses the CPAP machine as much as possible, but only manages to sleep for about an hour and a half at a time. He experiences episodes of not breathing well and 'starving for air' when not using the CPAP. Says he can not sleep without it feels it really helps him breathe better while he sleeping..  Patient recently had his CDL physical and had compliance from his current CPAP machine and travel CPAP machine with 100% usage as reported..  CPAP download from main machine shows good compliance with daily average usage at 5.5 hours.  Patient is on CPAP 15 cm H2O.  AHI 12.6/hour.  Positive mask leaks.  Patient is having more residual episodes than previous year.  We discussed making some CPAP pressure changes.  He has a history of back and knee issues and is currently being evaluated by a neurosurgeon and a knee surgeon. He experiences pain that radiates down his right leg and has difficulty sitting or lying down. He has not yet had an MRI. He had a total knee replacement five years ago. Recently, he received a shot in the L3 region to alleviate pain, but continues to experience discomfort.  He has had bronchitis for about three weeks, characterized by  coughing and a runny nose. He was treated with prednisone  and an antibiotic, and is starting to feel better. He smokes about a pack a day.  We discussed smoking cessation.  We discussed referral to the lung cancer CT screening program.  As patient qualifies.  He works for [employer] or [employer] and is currently on short-term disability due to his back and knee issues.     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   Colon polyps 2011   rpt Q5 yrs   COVID-19 01/02/2024   DDD (degenerative disc disease) SPINE   History of kidney stones    HLD (hyperlipidemia)    Internal hemorrhoids    Kidney stone    OSA on CPAP DR CLANCE   CPAP   Paroxysmal A-fib (HCC) CARDIOLOGIST-  DR FERNANDE-- LAST VISIT 10-22-2010 IN EPIC   controlled with flecainide  300mg  prn- this medication is out of date   Seasonal allergies    Sleep apnea    wears CPAP  Tobacco History: Social History   Tobacco Use  Smoking Status Every Day   Current packs/day: 1.00   Average packs/day: 1 pack/day for 37.2 years (37.2 ttl pk-yrs)   Types: Cigarettes   Start date: 05/31/1987   Passive exposure: Current  Smokeless Tobacco Never  Tobacco Comments   pack per day 08/10/24   Ready to quit: Not Answered Counseling given: Not Answered Tobacco comments: pack per day 08/10/24   Outpatient Medications Prior to Visit  Medication Sig Dispense Refill   Acetaminophen  500 MG capsule Take 1,000 mg by mouth every 6 (six) hours as needed for headache.     celecoxib  (CELEBREX ) 200 MG capsule Take 200  mg by mouth daily.     doxycycline  (VIBRA -TABS) 100 MG tablet Take 1 tablet (100 mg total) by mouth 2 (two) times daily. 20 tablet 0   gabapentin  (NEURONTIN ) 600 MG tablet Take 600 mg by mouth 3 (three) times daily. (Patient taking differently: Take 600 mg by mouth 2 (two) times daily.)     metoprolol  succinate (TOPROL -XL) 25 MG 24 hr tablet Take 1 tablet (25 mg total) by mouth at bedtime. 90 tablet 3   oxyCODONE  (OXY IR/ROXICODONE ) 5 MG immediate release tablet Take 5 mg by mouth 2 (two) times daily as needed for severe pain or moderate pain.     flecainide  (TAMBOCOR ) 100 MG tablet Take 1 tablet (100 mg total) by mouth 2 (two) times daily. (Patient not taking: Reported on 08/10/2024) 180 tablet 3   predniSONE  (DELTASONE ) 20 MG tablet Take 2 tablets (40 mg total) by mouth daily with breakfast. (Patient not taking: Reported on 08/10/2024) 10 tablet 0   No facility-administered medications prior to visit.     Review of Systems:   Constitutional:   No  weight loss, night sweats,  Fevers, chills, fatigue, or  lassitude.  HEENT:   No headaches,  Difficulty swallowing,  Tooth/dental problems, or  Sore throat,                No sneezing, itching, ear ache, nasal congestion, post nasal drip,   CV:  No chest pain,  Orthopnea, PND, swelling in lower extremities, anasarca, dizziness, palpitations, syncope.   GI  No heartburn, indigestion, abdominal pain, nausea, vomiting, diarrhea, change in bowel habits, loss of appetite, bloody stools.   Resp: No shortness of breath with exertion or at rest.  No excess mucus, no productive cough,  No non-productive cough,  No coughing up of blood.  No change in color of mucus.  No wheezing.  No chest wall deformity  Skin: no rash or lesions.  GU: no dysuria, change in color of urine, no urgency or frequency.  No flank pain, no hematuria   MS:  No joint pain or swelling.  No decreased range of motion.  No back pain.    Physical Exam  BP 126/62   Pulse 78    Temp 98 F (36.7 C)   Ht 6' 3 (1.905 m) Comment: Per pt  Wt 276 lb (125.2 kg)   SpO2 99% Comment: RA  BMI 34.50 kg/m   GEN: A/Ox3; pleasant , NAD, well nourished    HEENT:  [place]/AT,  EACs-clear, TMs-wnl, NOSE-clear, THROAT-clear, no lesions, no postnasal drip or exudate noted.   NECK:  Supple w/ fair ROM; no JVD; normal carotid impulses w/o bruits; no thyromegaly or nodules palpated; no lymphadenopathy.    RESP  Clear  P & A; w/o, wheezes/ rales/ or rhonchi. no accessory muscle use, no dullness  to percussion  CARD:  RRR, no m/r/g, no peripheral edema, pulses intact, no cyanosis or clubbing.  GI:   Soft & nt; nml bowel sounds; no organomegaly or masses detected.   Musco: Warm bil, no deformities or joint swelling noted.   Neuro: alert, no focal deficits noted.    Skin: Warm, no lesions or rashes    Lab Results:Reviewed 08/10/2024   CBC    Component Value Date/Time   WBC 9.1 11/22/2022 1458   RBC 4.77 11/22/2022 1458   HGB 14.4 11/22/2022 1458   HCT 42.2 11/22/2022 1458   PLT 225.0 11/22/2022 1458   MCV 88.6 11/22/2022 1458   MCH 28.3 04/01/2021 1535   MCHC 34.2 11/22/2022 1458   RDW 14.3 11/22/2022 1458   LYMPHSABS 2.8 11/22/2022 1458   MONOABS 0.6 11/22/2022 1458   EOSABS 0.1 11/22/2022 1458   BASOSABS 0.1 11/22/2022 1458    BMET    Component Value Date/Time   NA 139 11/22/2022 1458   NA 138 05/01/2021 0952   K 4.0 11/22/2022 1458   CL 102 11/22/2022 1458   CO2 27 11/22/2022 1458   GLUCOSE 93 11/22/2022 1458   BUN 19 11/22/2022 1458   BUN 15 05/01/2021 0952   CREATININE 1.26 11/22/2022 1458   CALCIUM 9.4 11/22/2022 1458   GFRNONAA >60 04/01/2021 1535   GFRAA >60 07/20/2019 1129    BNP No results found for: BNP  ProBNP    Component Value Date/Time   PROBNP 15.0 06/26/2019 1627    Imaging: No results found.  Administration History     None           No data to display          No results found for: NITRICOXIDE      No data  to display              Assessment & Plan:   Assessment and Plan Assessment & Plan Obstructive sleep apnea, severe, on CPAP with residual episodes.   Severe obstructive sleep apnea persists with increased episodes despite daily CPAP use.  Will adjust CPAP pressure to hopefully decrease residual sleep apneic events.  Changed to auto CPAP 8 to 16 cm H2O. . Discussed the potential for the Inspire device as an alternative, contingent on insurance coverage and further evaluation.  Ramp turned off per patient request.    Atrial fibrillation,-stable Continue on current regimen and follow-up with cardiology   Nicotine dependence, cigarettes, with ongoing tobacco use   Ongoing tobacco use with a history of smoking about a pack a day. Discussed the importance of lung cancer screening due to smoking history. He expressed interest in quitting smoking. Referred to a lung cancer screening program for an annual CT scan. Encouraged smoking cessation and provided resources such as 1-800-QUIT-NOW, patches, and medications.  Recent acute bronchitis, resolving   Recent acute bronchitis lasted about three weeks and is now resolving. Symptoms included coughing and nasal congestion. Completed a course of prednisone  and is currently on antibiotics. Continue the current antibiotic regimen.  Follow-up if not improving  Morbid obesity with BMI 34.  Discussed healthy weight loss.        Madelin Stank, NP 08/10/2024

## 2024-08-10 NOTE — Patient Instructions (Addendum)
 Continue on CPAP At bedtime   Adjust CPAP pressure to 8-16cmH2o. CPAP download in 4 weeks.  Work on weight loss.  Do not drive if sleepy  Refer to Lung cancer CT chest screening program.  Work on quitting smoking.   Follow up Dr. Jude or Malu Pellegrini NP  In 1 year and As needed

## 2024-08-21 ENCOUNTER — Other Ambulatory Visit: Payer: Self-pay | Admitting: *Deleted

## 2024-08-21 ENCOUNTER — Telehealth: Payer: Self-pay | Admitting: *Deleted

## 2024-08-21 DIAGNOSIS — F17210 Nicotine dependence, cigarettes, uncomplicated: Secondary | ICD-10-CM

## 2024-08-21 DIAGNOSIS — Z122 Encounter for screening for malignant neoplasm of respiratory organs: Secondary | ICD-10-CM

## 2024-08-21 DIAGNOSIS — Z87891 Personal history of nicotine dependence: Secondary | ICD-10-CM

## 2024-08-21 NOTE — Telephone Encounter (Signed)
 Lung Cancer Screening Narrative/Criteria Questionnaire (Cigarette Smokers Only- No Cigars/Pipes/vapes)   [name]   SDMV:08/28/24 8:00- Kristen                                           04/26/1971              LDCT: 08/30/24 9:40- GI    53 y.o.   Phone: [phone]  Lung Screening Narrative (confirm age 38-77 yrs Medicare / 50-80 yrs Private pay insurance)   Insurance information:[employer]   Referring Provider:Parrett   This screening involves an initial phone call with a team member from our program. It is called a shared decision making visit. The initial meeting is required by insurance and Medicare to make sure you understand the program. This appointment takes about 15-20 minutes to complete. The CT scan will completed at a separate date/time. This scan takes about 5-10 minutes to complete and you may eat and drink before and after the scan.  Criteria questions for Lung Cancer Screening:   Are you a current or former smoker? Current Age began smoking: 41   If you are a former smoker, what year did you quit smoking? (within 15 yrs)   To calculate your smoking history, I need an accurate estimate of how many packs of cigarettes you smoked per day and for how many years. (Not just the number of PPD you are now smoking)   Years smoking 39 x Packs per day 1 = Pack years 39   (at least 20 pack yrs)   (Make sure they understand that we need to know how much they have smoked in the past, not just the number of PPD they are smoking now)  Do you have a personal history of cancer?  No    Do you have a family history of cancer? Yes  (cancer type and and relative) Father (liver)   Are you coughing up blood?  No  Have you had unexplained weight loss of 15 lbs or more in the last 6 months? No  It looks like you meet all criteria.     Additional information: N/A

## 2024-08-28 ENCOUNTER — Encounter: Payer: Self-pay | Admitting: *Deleted

## 2024-08-28 ENCOUNTER — Ambulatory Visit

## 2024-08-28 DIAGNOSIS — F17210 Nicotine dependence, cigarettes, uncomplicated: Secondary | ICD-10-CM

## 2024-08-28 NOTE — Progress Notes (Signed)
  Virtual Visit via Video Note  I connected with [name] on 08/28/24 at  8:00 AM EST by a video enabled telemedicine application and verified that I am speaking with the correct person using two identifiers.  Location: Patient: in home Provider: [address], [place], [place], [address]     Shared Decision Making Visit Lung Cancer Screening Program [phone])   Eligibility: Age 53 y.o. Pack Years Smoking History Calculation 39 (# packs/per year x # years smoked) Recent History of coughing up blood  no Unexplained weight loss? no ( >Than 15 pounds within the last 6 months ) Prior History Lung / other cancer no (Diagnosis within the last 5 years already requiring surveillance chest CT Scans). Smoking Status Current Smoker Former Smokers: Years since quit: NA  Quit Date:  NA  Visit Components: Discussion included one or more decision making aids. yes Discussion included risk/benefits of screening. yes Discussion included potential follow up diagnostic testing for abnormal scans. yes Discussion included meaning and risk of over diagnosis. yes Discussion included meaning and risk of False Positives. yes Discussion included meaning of total radiation exposure. yes  Counseling Included: Importance of adherence to annual lung cancer LDCT screening. yes Impact of comorbidities on ability to participate in the program. yes Ability and willingness to under diagnostic treatment. yes  Smoking Cessation Counseling: Current Smokers:  Discussed importance of smoking cessation. yes Information about tobacco cessation classes and interventions provided to patient. yes Patient provided with ticket for LDCT Scan. yes Symptomatic Patient. no  Counseling NA Diagnosis Code: Tobacco Use Z72.0 Asymptomatic Patient yes  Counseling (Intermediate counseling: > three minutes counseling) H9563  Counseled patient 4 minutes regarding tobacco use.   Former Smokers:  Discussed the  importance of maintaining cigarette abstinence. yes Diagnosis Code: Personal History of Nicotine Dependence. S12.108 Information about tobacco cessation classes and interventions provided to patient. Yes Patient provided with ticket for LDCT Scan. yes Written Order for Lung Cancer Screening with LDCT placed in Epic. Yes (CT Chest Lung Cancer Screening Low Dose W/O CM) [id number] Z12.2-Screening of respiratory organs Z87.891-Personal history of nicotine dependence   Josette Ranger, RN 08/28/24

## 2024-08-28 NOTE — Patient Instructions (Signed)

## 2024-08-30 ENCOUNTER — Inpatient Hospital Stay: Admission: RE | Admit: 2024-08-30 | Discharge: 2024-08-30 | Attending: Acute Care | Admitting: Acute Care

## 2024-08-30 DIAGNOSIS — Z87891 Personal history of nicotine dependence: Secondary | ICD-10-CM

## 2024-08-30 DIAGNOSIS — Z122 Encounter for screening for malignant neoplasm of respiratory organs: Secondary | ICD-10-CM

## 2024-08-30 DIAGNOSIS — F17210 Nicotine dependence, cigarettes, uncomplicated: Secondary | ICD-10-CM

## 2024-09-04 ENCOUNTER — Other Ambulatory Visit: Payer: Self-pay

## 2024-09-04 DIAGNOSIS — F17210 Nicotine dependence, cigarettes, uncomplicated: Secondary | ICD-10-CM

## 2024-09-04 DIAGNOSIS — Z87891 Personal history of nicotine dependence: Secondary | ICD-10-CM

## 2024-09-04 DIAGNOSIS — Z122 Encounter for screening for malignant neoplasm of respiratory organs: Secondary | ICD-10-CM

## 2024-09-22 ENCOUNTER — Other Ambulatory Visit: Payer: Self-pay | Admitting: Family Medicine

## 2024-09-22 DIAGNOSIS — Z1159 Encounter for screening for other viral diseases: Secondary | ICD-10-CM

## 2024-09-22 DIAGNOSIS — Z8249 Family history of ischemic heart disease and other diseases of the circulatory system: Secondary | ICD-10-CM

## 2024-09-22 DIAGNOSIS — E782 Mixed hyperlipidemia: Secondary | ICD-10-CM

## 2024-09-22 DIAGNOSIS — I480 Paroxysmal atrial fibrillation: Secondary | ICD-10-CM

## 2024-09-22 DIAGNOSIS — Z125 Encounter for screening for malignant neoplasm of prostate: Secondary | ICD-10-CM

## 2024-09-24 ENCOUNTER — Other Ambulatory Visit (INDEPENDENT_AMBULATORY_CARE_PROVIDER_SITE_OTHER)

## 2024-09-24 DIAGNOSIS — R7989 Other specified abnormal findings of blood chemistry: Secondary | ICD-10-CM

## 2024-09-24 DIAGNOSIS — E782 Mixed hyperlipidemia: Secondary | ICD-10-CM | POA: Diagnosis not present

## 2024-09-24 DIAGNOSIS — I480 Paroxysmal atrial fibrillation: Secondary | ICD-10-CM | POA: Diagnosis not present

## 2024-09-24 DIAGNOSIS — Z1159 Encounter for screening for other viral diseases: Secondary | ICD-10-CM

## 2024-09-24 DIAGNOSIS — Z125 Encounter for screening for malignant neoplasm of prostate: Secondary | ICD-10-CM

## 2024-09-24 DIAGNOSIS — Z8249 Family history of ischemic heart disease and other diseases of the circulatory system: Secondary | ICD-10-CM

## 2024-09-24 LAB — COMPREHENSIVE METABOLIC PANEL WITH GFR
ALT: 9 U/L (ref 3–53)
AST: 11 U/L (ref 5–37)
Albumin: 4.2 g/dL (ref 3.5–5.2)
Alkaline Phosphatase: 62 U/L (ref 39–117)
BUN: 18 mg/dL (ref 6–23)
CO2: 32 meq/L (ref 19–32)
Calcium: 9.2 mg/dL (ref 8.4–10.5)
Chloride: 101 meq/L (ref 96–112)
Creatinine, Ser: 1.17 mg/dL (ref 0.40–1.50)
GFR: 69.99 mL/min
Glucose, Bld: 99 mg/dL (ref 70–99)
Potassium: 4.2 meq/L (ref 3.5–5.1)
Sodium: 138 meq/L (ref 135–145)
Total Bilirubin: 0.4 mg/dL (ref 0.2–1.2)
Total Protein: 7 g/dL (ref 6.0–8.3)

## 2024-09-24 LAB — CBC WITH DIFFERENTIAL/PLATELET
Basophils Absolute: 0 K/uL (ref 0.0–0.1)
Basophils Relative: 0.6 % (ref 0.0–3.0)
Eosinophils Absolute: 0.1 K/uL (ref 0.0–0.7)
Eosinophils Relative: 1.9 % (ref 0.0–5.0)
HCT: 43 % (ref 39.0–52.0)
Hemoglobin: 14.3 g/dL (ref 13.0–17.0)
Lymphocytes Relative: 32.3 % (ref 12.0–46.0)
Lymphs Abs: 2.4 K/uL (ref 0.7–4.0)
MCHC: 33.3 g/dL (ref 30.0–36.0)
MCV: 84.8 fl (ref 78.0–100.0)
Monocytes Absolute: 0.5 K/uL (ref 0.1–1.0)
Monocytes Relative: 6.7 % (ref 3.0–12.0)
Neutro Abs: 4.4 K/uL (ref 1.4–7.7)
Neutrophils Relative %: 58.5 % (ref 43.0–77.0)
Platelets: 187 K/uL (ref 150.0–400.0)
RBC: 5.07 Mil/uL (ref 4.22–5.81)
RDW: 14.4 % (ref 11.5–15.5)
WBC: 7.5 K/uL (ref 4.0–10.5)

## 2024-09-24 LAB — LIPID PANEL
Cholesterol: 185 mg/dL (ref 28–200)
HDL: 38.9 mg/dL — ABNORMAL LOW
LDL Cholesterol: 118 mg/dL — ABNORMAL HIGH (ref 10–99)
NonHDL: 146.08
Total CHOL/HDL Ratio: 5
Triglycerides: 140 mg/dL (ref 10.0–149.0)
VLDL: 28 mg/dL (ref 0.0–40.0)

## 2024-09-24 LAB — TSH: TSH: 1.47 u[IU]/mL (ref 0.35–5.50)

## 2024-09-24 LAB — PSA: PSA: 0.51 ng/mL (ref 0.10–4.00)

## 2024-09-24 LAB — TESTOSTERONE: Testosterone: 351.55 ng/dL (ref 300.00–890.00)

## 2024-09-24 NOTE — Addendum Note (Signed)
 Addended by: HOPE VEVA PARAS on: 09/24/2024 07:51 AM   Modules accepted: Orders

## 2024-09-25 ENCOUNTER — Ambulatory Visit: Payer: Self-pay | Admitting: Family Medicine

## 2024-09-27 ENCOUNTER — Telehealth: Payer: Self-pay | Admitting: Adult Health

## 2024-09-27 DIAGNOSIS — G4733 Obstructive sleep apnea (adult) (pediatric): Secondary | ICD-10-CM

## 2024-09-27 NOTE — Telephone Encounter (Signed)
 Called and spoke to pt - advised of pressure change per [facility]. Pt verbalized understanding. Reminder was set for four weeks from today to get a new compliance report.   One copy was made of the compliance report. The original was placed into the scan folder in B Pod along with the pt's name, DOB, and MRN. The copy was placed into Tammy's cabinet in B Pod. NFN.

## 2024-09-27 NOTE — Telephone Encounter (Signed)
 CPAP DL shows increase residual episodes AHI 27/hr (on CPAP auto 8-16) changed from 15cmH2o.   Recommend change CPAP 14cmH2o . Download in 4 weeks  If still having high residuals consider CPAP titration  [facility] updated

## 2024-09-28 LAB — LIPOPROTEIN A (LPA): Lipoprotein (a): <10 nmol/L

## 2024-09-28 LAB — HEPATITIS C ANTIBODY: Hepatitis C Ab: NONREACTIVE

## 2024-10-01 ENCOUNTER — Ambulatory Visit: Admitting: Family Medicine

## 2024-10-01 ENCOUNTER — Encounter: Payer: Self-pay | Admitting: Family Medicine

## 2024-10-01 VITALS — BP 120/78 | HR 72 | Temp 97.9°F | Ht 73.25 in | Wt 272.6 lb

## 2024-10-01 DIAGNOSIS — F331 Major depressive disorder, recurrent, moderate: Secondary | ICD-10-CM

## 2024-10-01 DIAGNOSIS — Z0001 Encounter for general adult medical examination with abnormal findings: Secondary | ICD-10-CM

## 2024-10-01 DIAGNOSIS — R7989 Other specified abnormal findings of blood chemistry: Secondary | ICD-10-CM

## 2024-10-01 DIAGNOSIS — Z0000 Encounter for general adult medical examination without abnormal findings: Secondary | ICD-10-CM

## 2024-10-01 DIAGNOSIS — M961 Postlaminectomy syndrome, not elsewhere classified: Secondary | ICD-10-CM | POA: Diagnosis not present

## 2024-10-01 DIAGNOSIS — I480 Paroxysmal atrial fibrillation: Secondary | ICD-10-CM

## 2024-10-01 DIAGNOSIS — F17200 Nicotine dependence, unspecified, uncomplicated: Secondary | ICD-10-CM | POA: Diagnosis not present

## 2024-10-01 DIAGNOSIS — Z23 Encounter for immunization: Secondary | ICD-10-CM | POA: Diagnosis not present

## 2024-10-01 DIAGNOSIS — G6289 Other specified polyneuropathies: Secondary | ICD-10-CM

## 2024-10-01 DIAGNOSIS — J439 Emphysema, unspecified: Secondary | ICD-10-CM

## 2024-10-01 DIAGNOSIS — E782 Mixed hyperlipidemia: Secondary | ICD-10-CM | POA: Diagnosis not present

## 2024-10-01 DIAGNOSIS — G4733 Obstructive sleep apnea (adult) (pediatric): Secondary | ICD-10-CM

## 2024-10-01 DIAGNOSIS — R052 Subacute cough: Secondary | ICD-10-CM

## 2024-10-01 DIAGNOSIS — G8929 Other chronic pain: Secondary | ICD-10-CM

## 2024-10-01 MED ORDER — DOXYCYCLINE HYCLATE 100 MG PO TABS: 100.0000 mg | ORAL_TABLET | Freq: Two times a day (BID) | ORAL | 0 refills | Status: AC

## 2024-10-01 NOTE — Progress Notes (Unsigned)
 " Ph: [phone] Fax: [phone]   Patient ID: [name], male    DOB: Jun 20, 1971, 54 y.o.   MRN: [id number]  This visit was conducted in person.  BP 120/78 (BP Location: Left Arm, Patient Position: Sitting, Cuff Size: Large)   Pulse 72   Temp 97.9 F (36.6 C) (Oral)   Ht 6' 1.25 (1.861 m)   Wt 272 lb 9.6 oz (123.7 kg)   SpO2 94%   BMI 35.72 kg/m    CC: CPE Subjective:   HPI: [name] is a 54 y.o. male presenting on 10/01/2024 for Annual Exam (Would like to discuuss flu and prevnar vax)   Parox afib followed by EP Robyne) on metoprolol  PRN. Continues flecainide  100mg  bid. Was on eliquis  for 2 months. Cardioversion failed. Has seen Dr Fernande longterm - had afib at age 4yo.  H/o carpal tunnel and ulnar nerve transposition surgery by Dr Camella.   Regularly sees PM&R s/p multiple steroid injections.  Chronic R radiculopathy that starts in lower back and can travel down to lateral right foot, sometimes L leg pain as well. + numbness of both feet.  He continues oxycodone  10mg  1/2 tab PRN through Dr Bonner. Also on celebrex  200mg  daily, gabapentin  600mg  BID. Tried spinal cord stimulator trial with benefit- just had leads removed this morning.  Known chronic neuropathy.  H/o R SIJ fusion Fall 2024.  Now seeing Dr Smith neurosurgeon [facility], continues seeing Ramos, and most recently Dr Peyton Public for spinal cord stimulator ([facility]).   OSA on CPAP followed by [facility] pulm.   30 lb weight loss since 12/2022!   Preventative: COLONOSCOPY 12/2019 - int hem, rpt 10 yrs Oletta) Prostate cancer screening - no symptoms. No fmhx prostate cancer.  Lung cancer screening - first lung cancer screening CT 08/2024, rpt yearly Flu shot - yearly  COVID shot - discussed, declines  Tetanus shot - thinks has had one relatively recently Pneumonia shot - declined Shingrix  - 07/2021, 01/2022 Advanced directive discussion -  Seat belt use discussed   Sunscreen use discussed. No changing moles on skin.  Smoking - 1 ppd since age 79 yo - cut down to <1ppd Alcohol  - no Dentist - q3 mo Eye exam - q1-2 yrs Bowel - no constipation   Caffeine: 30oz mountain dew  Lives with wife, 1 daughter, 1 step daughter, 2 dogs  Occupation: art gallery manager for [employer]  Activity: no regular exercise, trouble with back pain  Diet: good, vegetables.     Relevant past medical, surgical, family and social history reviewed and updated as indicated. Interim medical history since our last visit reviewed. Allergies and medications reviewed and updated. Outpatient Medications Prior to Visit  Medication Sig Dispense Refill   Acetaminophen  500 MG capsule Take 1,000 mg by mouth every 6 (six) hours as needed for headache.     celecoxib  (CELEBREX ) 200 MG capsule Take 200 mg by mouth daily.     gabapentin  (NEURONTIN ) 600 MG tablet Take 600 mg by mouth 3 (three) times daily. (Patient taking differently: Take 600 mg by mouth 2 (two) times daily.)     metoprolol  succinate (TOPROL -XL) 25 MG 24 hr tablet Take 1 tablet (25 mg total) by mouth at bedtime. 90 tablet 3   oxyCODONE  (OXY IR/ROXICODONE ) 5 MG immediate release tablet Take 5 mg by mouth 2 (two) times daily as needed for severe pain or moderate pain.     flecainide  (TAMBOCOR ) 100 MG tablet  Take 1 tablet (100 mg total) by mouth 2 (two) times daily. (Patient not taking: Reported on 10/01/2024) 180 tablet 3   doxycycline  (VIBRA -TABS) 100 MG tablet Take 1 tablet (100 mg total) by mouth 2 (two) times daily. (Patient not taking: Reported on 10/01/2024) 20 tablet 0   predniSONE  (DELTASONE ) 20 MG tablet Take 2 tablets (40 mg total) by mouth daily with breakfast. (Patient not taking: Reported on 10/01/2024) 10 tablet 0   No facility-administered medications prior to visit.     Per HPI unless specifically indicated in ROS section below Review of Systems  Constitutional:  Negative for activity change,  appetite change, chills, fatigue, fever and unexpected weight change.  HENT:  Positive for congestion and rhinorrhea. Negative for hearing loss.   Eyes:  Negative for visual disturbance.  Respiratory:  Positive for cough. Negative for chest tightness, shortness of breath and wheezing.   Cardiovascular:  Negative for chest pain, palpitations and leg swelling.  Gastrointestinal:  Negative for abdominal distention, abdominal pain, blood in stool, constipation, diarrhea, nausea and vomiting.  Genitourinary:  Negative for difficulty urinating and hematuria.  Musculoskeletal:  Positive for arthralgias, back pain and myalgias. Negative for neck pain.  Skin:  Negative for rash.  Neurological:  Negative for dizziness, seizures, syncope and headaches.  Hematological:  Negative for adenopathy. Does not bruise/bleed easily.  Psychiatric/Behavioral:  Negative for dysphoric mood. The patient is not nervous/anxious.     Objective:  BP 120/78 (BP Location: Left Arm, Patient Position: Sitting, Cuff Size: Large)   Pulse 72   Temp 97.9 F (36.6 C) (Oral)   Ht 6' 1.25 (1.861 m)   Wt 272 lb 9.6 oz (123.7 kg)   SpO2 94%   BMI 35.72 kg/m   Wt Readings from Last 3 Encounters:  10/01/24 272 lb 9.6 oz (123.7 kg)  08/10/24 276 lb (125.2 kg)  08/01/24 269 lb (122 kg)      Physical Exam Vitals and nursing note reviewed.  Constitutional:      General: He is not in acute distress.    Appearance: Normal appearance. He is well-developed. He is not ill-appearing.  HENT:     Head: Normocephalic and atraumatic.     Right Ear: Hearing, tympanic membrane, ear canal and external ear normal.     Left Ear: Hearing, tympanic membrane, ear canal and external ear normal.     Mouth/Throat:     Comments: Wearing mask Eyes:     General: No scleral icterus.    Extraocular Movements: Extraocular movements intact.     Conjunctiva/sclera: Conjunctivae normal.     Pupils: Pupils are equal, round, and reactive to light.   Neck:     Thyroid : No thyroid  mass or thyromegaly.  Cardiovascular:     Rate and Rhythm: Normal rate and regular rhythm.     Pulses: Normal pulses.          Radial pulses are 2+ on the right side and 2+ on the left side.     Heart sounds: Normal heart sounds. No murmur heard. Pulmonary:     Effort: Pulmonary effort is normal. No respiratory distress.     Breath sounds: Rhonchi (RLL) present. No wheezing or rales.  Abdominal:     General: Bowel sounds are normal. There is no distension.     Palpations: Abdomen is soft. There is no mass.     Tenderness: There is no abdominal tenderness. There is no guarding or rebound.     Hernia: No hernia is present.  Musculoskeletal:        General: Normal range of motion.     Cervical back: Normal range of motion and neck supple.     Right lower leg: No edema.     Left lower leg: No edema.  Lymphadenopathy:     Cervical: No cervical adenopathy.  Skin:    General: Skin is warm and dry.     Findings: No rash.  Neurological:     General: No focal deficit present.     Mental Status: He is alert and oriented to person, place, and time.  Psychiatric:        Mood and Affect: Mood normal.        Behavior: Behavior normal.        Thought Content: Thought content normal.        Judgment: Judgment normal.       Results for orders placed or performed in visit on 09/24/24  TSH   Collection Time: 09/24/24  7:44 AM  Result Value Ref Range   TSH 1.47 0.35 - 5.50 uIU/mL  PSA   Collection Time: 09/24/24  7:44 AM  Result Value Ref Range   PSA 0.51 0.10 - 4.00 ng/mL  Lipoprotein A (LPA)   Collection Time: 09/24/24  7:44 AM  Result Value Ref Range   Lipoprotein (a) <10 <75 nmol/L  CBC with Differential/Platelet   Collection Time: 09/24/24  7:44 AM  Result Value Ref Range   WBC 7.5 4.0 - 10.5 K/uL   RBC 5.07 4.22 - 5.81 Mil/uL   Hemoglobin 14.3 13.0 - 17.0 g/dL   HCT 56.9 60.9 - 47.9 %   MCV 84.8 78.0 - 100.0 fl   MCHC 33.3 30.0 - 36.0 g/dL    RDW 85.5 88.4 - 84.4 %   Platelets 187.0 150.0 - 400.0 K/uL   Neutrophils Relative % 58.5 43.0 - 77.0 %   Lymphocytes Relative 32.3 12.0 - 46.0 %   Monocytes Relative 6.7 3.0 - 12.0 %   Eosinophils Relative 1.9 0.0 - 5.0 %   Basophils Relative 0.6 0.0 - 3.0 %   Neutro Abs 4.4 1.4 - 7.7 K/uL   Lymphs Abs 2.4 0.7 - 4.0 K/uL   Monocytes Absolute 0.5 0.1 - 1.0 K/uL   Eosinophils Absolute 0.1 0.0 - 0.7 K/uL   Basophils Absolute 0.0 0.0 - 0.1 K/uL  Hepatitis C antibody   Collection Time: 09/24/24  7:44 AM  Result Value Ref Range   Hepatitis C Ab NON-REACTIVE NON-REACTIVE  Comprehensive metabolic panel with GFR   Collection Time: 09/24/24  7:44 AM  Result Value Ref Range   Sodium 138 135 - 145 mEq/L   Potassium 4.2 3.5 - 5.1 mEq/L   Chloride 101 96 - 112 mEq/L   CO2 32 19 - 32 mEq/L   Glucose, Bld 99 70 - 99 mg/dL   BUN 18 6 - 23 mg/dL   Creatinine, Ser 8.82 0.40 - 1.50 mg/dL   Total Bilirubin 0.4 0.2 - 1.2 mg/dL   Alkaline Phosphatase 62 39 - 117 U/L   AST 11 5 - 37 U/L   ALT 9 3 - 53 U/L   Total Protein 7.0 6.0 - 8.3 g/dL   Albumin 4.2 3.5 - 5.2 g/dL   GFR 30.00 >39.99 mL/min   Calcium 9.2 8.4 - 10.5 mg/dL  Lipid panel   Collection Time: 09/24/24  7:44 AM  Result Value Ref Range   Cholesterol 185 28 - 200 mg/dL   Triglycerides 859.9 89.9 -  149.0 mg/dL   HDL 61.09 (L) >60.99 mg/dL   VLDL 71.9 0.0 - 59.9 mg/dL   LDL Cholesterol 881 (H) 10 - 99 mg/dL   Total CHOL/HDL Ratio 5    NonHDL 146.08   Testosterone    Collection Time: 09/24/24  7:44 AM  Result Value Ref Range   Testosterone  351.55 300.00 - 890.00 ng/dL   Lab Results  Component Value Date   VITAMINB12 430 07/29/2021   Assessment & Plan:   Problem List Items Addressed This Visit     Encounter for general adult medical examination with abnormal findings - Primary (Chronic)   Preventative protocols reviewed and updated unless pt declined. Discussed healthy diet and lifestyle.          Meds ordered this  encounter  Medications   doxycycline  (VIBRA -TABS) 100 MG tablet    Sig: Take 1 tablet (100 mg total) by mouth 2 (two) times daily.    Dispense:  20 tablet    Refill:  0    No orders of the defined types were placed in this encounter.   Patient Instructions  Flu shot today  Consider pneumonia shot next time.  CT showed emphysema changes to lungs - continue cutting down on tobacco.  Doxycycline  antibiotic printed out and fill if you develop worsening respiratory symptoms such as productive cough or fever >101 Labwork was overall doing better!  Good to see you today  Return to see me in 1 year for next physical, sooner if needed  Follow up plan: Return in about 1 year (around 10/01/2025), or if symptoms worsen or fail to improve, for annual exam, prior fasting for blood work.  Anton Blas, MD   "

## 2024-10-01 NOTE — Patient Instructions (Addendum)
 Flu shot today  Consider pneumonia shot next time.  CT showed emphysema changes to lungs - continue cutting down on tobacco.  Doxycycline  antibiotic printed out and fill if you develop worsening respiratory symptoms such as productive cough or fever >101 Labwork was overall doing better!  Good to see you today  Return to see me in 1 year for next physical, sooner if needed

## 2024-10-01 NOTE — Assessment & Plan Note (Signed)
 Preventative protocols reviewed and updated unless pt declined. Discussed healthy diet and lifestyle.

## 2024-10-02 DIAGNOSIS — J449 Chronic obstructive pulmonary disease, unspecified: Secondary | ICD-10-CM | POA: Insufficient documentation

## 2024-10-02 DIAGNOSIS — Z23 Encounter for immunization: Secondary | ICD-10-CM | POA: Diagnosis not present

## 2024-10-02 NOTE — Assessment & Plan Note (Signed)
 Chronic, not on statin. Chol levels mildly above goal, Lp(a) normal.  To consider statin vs cardiac CT with CCS for further risk stratification.  The 10-year ASCVD risk score (Arnett DK, et al., 2019) is: 12.3%   Values used to calculate the score:     Age: 54 years     Clinically relevant sex: Male     Is Non-Hispanic African American: No     Diabetic: No     Tobacco smoker: Yes     Systolic Blood Pressure: 120 mmHg     Is BP treated: Yes     HDL Cholesterol: 38.9 mg/dL     Total Cholesterol: 185 mg/dL

## 2024-10-02 NOTE — Assessment & Plan Note (Addendum)
 Ongoing cough since last seen, at that time treated with doxycycline /prednisone  with some improvement.  In interim had overall reassuring lung CT which was reassuring but did show changes of emphysema.  Notes persistence of cough without wheeze or dyspnea.  WASP for doxycycline  course sent to pharmacy with instructions when to fill.

## 2024-10-02 NOTE — Assessment & Plan Note (Signed)
 Recent lung cancer screening CT results reviewed - emphysema changes. Encourage full cessation. Consider daily respiratory medication if ongoing cough.

## 2024-10-02 NOTE — Assessment & Plan Note (Addendum)
 Regularly sees cardiology on flecainide  and toprol  XL, not on AC

## 2024-10-02 NOTE — Assessment & Plan Note (Signed)
 Levels normalizing with recent weight loss. Will continue to monitor.

## 2024-10-02 NOTE — Assessment & Plan Note (Addendum)
 Continues CPAP managed by [facility] pulmonology.

## 2024-10-02 NOTE — Assessment & Plan Note (Signed)
<  1ppd smoker. Continue to encourage full cessation. First lung cancer screening CT 08/2024.

## 2024-10-02 NOTE — Assessment & Plan Note (Signed)
Stable period off mood medication.

## 2024-10-02 NOTE — Assessment & Plan Note (Signed)
 Sees Dr Bonner, has also seen Dr Smith neurosurgery. Most recently seeing Dr Peyton Public at [facility], with planned spinal cord stimulator placement (s/p recent successful trial).

## 2024-10-02 NOTE — Assessment & Plan Note (Signed)
 Continues gabapentin  600mg  BID

## 2024-10-03 ENCOUNTER — Telehealth (HOSPITAL_BASED_OUTPATIENT_CLINIC_OR_DEPARTMENT_OTHER): Payer: Self-pay

## 2024-10-03 NOTE — Telephone Encounter (Signed)
 Please contact the requesting office regarding the indication for flecainide . We generally do not hold this medication for procedures. Thank you!

## 2024-10-03 NOTE — Telephone Encounter (Signed)
"  ° °  Pre-operative Risk Assessment    Patient Name: [name]  DOB: December 24, 1970 MRN: [id number]   Date of last office visit: 07/09/2024 with Ozell Prentice Passey Date of next office visit: None  Request for Surgical Clearance    Procedure:  Spinal cord stimulator/generator placement  Date of Surgery:  Clearance 10/31/24                                 Surgeon:  Does not specify Surgeon's Group or Practice Name:  [facility]  Phone number:  [phone] Fax number:  [phone]   Type of Clearance Requested:   - Medical  - Pharmacy:  Hold Flecanide-does not specify      Type of Anesthesia:  MAC   Additional requests/questions:  None  SignedPatrcia Iverson CROME   10/03/2024, 7:50 AM   "

## 2024-10-03 NOTE — Telephone Encounter (Signed)
 Spoke to [employer], the surgery scheduler at [facility] and she told me she will check with the doctor if the Flecainide  needs to be held, and if so, for how long. Awaiting her phone call back to pre-op.

## 2024-10-05 NOTE — Telephone Encounter (Signed)
 I called the requesting office to confirm the request to hold Flecainide . This is an antiarrhythmic medication and not a blood thinner. See notes from preop APP Saddie Cleaves, NP.    I left a detailed message today as well to call our office back ASAP to confirm if they are asking for a hold on Flecainide  or was this an error.

## 2024-10-08 NOTE — Telephone Encounter (Signed)
 I called the requesting office again in regard to Flecainide . I left message to call our office and confirm if they requested Flecainide  to be held. See previous notes. I asked for a call back today as we cannot proceed with the preop clearance until we s/w their office about the Flecainide .

## 2024-10-09 ENCOUNTER — Telehealth (HOSPITAL_BASED_OUTPATIENT_CLINIC_OR_DEPARTMENT_OTHER): Payer: Self-pay | Admitting: *Deleted

## 2024-10-09 NOTE — Telephone Encounter (Signed)
 Will fax notes to requesting office asking for them to please call us  in regard to previous notes about Flecainide .   Our office has tried x 3 to reach the requesting office. See notes from CMA rebecca she s/w Lyle, who was going to d/w surgeon though we have not heard anything back and we have not been able to reach anyone in their office.   We will need their office to please call us  at [phone], preop team in regard to Flecainide .

## 2024-10-09 NOTE — Telephone Encounter (Signed)
 See notes from requesting office the Flecainide  was an error. Pt does not need to hold Flecainide .

## 2024-10-09 NOTE — Telephone Encounter (Signed)
 S/w the pt and he has been scheduled tele preop appt 10/17/24. Med rec and consent are done.

## 2024-10-09 NOTE — Telephone Encounter (Signed)
" ° °  Name: [name]  DOB: 1971/02/27  MRN: [id number]  Primary Cardiologist: Redell Shallow, MD   Preoperative team, please contact this patient and set up a phone call appointment for further preoperative risk assessment. Please obtain consent and complete medication review. Thank you for your help.  I confirm that guidance regarding antiplatelet and oral anticoagulation therapy has been completed and, if necessary, noted below.  None requested  I also confirmed the patient resides in the state of [place] . As per [place] [employer] telemedicine laws, the patient must reside in the state in which the provider is licensed.   Lum LITTIE Louis, NP 10/09/2024, 4:33 PM [facility]    "

## 2024-10-09 NOTE — Telephone Encounter (Signed)
 Office called to say patient doesn't need to stop Flecainide . Please advise

## 2024-10-09 NOTE — Telephone Encounter (Signed)
 S/w the pt and he has been scheduled tele preop appt 10/17/24. Med rec and consent are done.      Patient Consent for Virtual Visit        [name] has provided verbal consent on 10/09/2024 for a virtual visit (video or telephone).   CONSENT FOR VIRTUAL VISIT FOR:  [name]  By participating in this virtual visit I agree to the following:  I hereby voluntarily request, consent and authorize [facility] and its employed or contracted physicians, physician assistants, nurse practitioners or other licensed health care professionals (the Practitioner), to provide me with telemedicine health care services (the Services) as deemed necessary by the treating Practitioner. I acknowledge and consent to receive the Services by the Practitioner via telemedicine. I understand that the telemedicine visit will involve communicating with the Practitioner through live audiovisual communication technology and the disclosure of certain medical information by electronic transmission. I acknowledge that I have been given the opportunity to request an in-person assessment or other available alternative prior to the telemedicine visit and am voluntarily participating in the telemedicine visit.  I understand that I have the right to withhold or withdraw my consent to the use of telemedicine in the course of my care at any time, without affecting my right to future care or treatment, and that the Practitioner or I may terminate the telemedicine visit at any time. I understand that I have the right to inspect all information obtained and/or recorded in the course of the telemedicine visit and may receive copies of available information for a reasonable fee.  I understand that some of the potential risks of receiving the Services via telemedicine include:  Delay or interruption in medical evaluation due to technological equipment failure or disruption; Information transmitted may not be sufficient (e.g.  poor resolution of images) to allow for appropriate medical decision making by the Practitioner; and/or  In rare instances, security protocols could fail, causing a breach of personal health information.  Furthermore, I acknowledge that it is my responsibility to provide information about my medical history, conditions and care that is complete and accurate to the best of my ability. I acknowledge that Practitioner's advice, recommendations, and/or decision may be based on factors not within their control, such as incomplete or inaccurate data provided by me or distortions of diagnostic images or specimens that may result from electronic transmissions. I understand that the practice of medicine is not an exact science and that Practitioner makes no warranties or guarantees regarding treatment outcomes. I acknowledge that a copy of this consent can be made available to me via my patient portal [facility] MyChart), or I can request a printed copy by calling the office of  [facility].    I understand that my insurance will be billed for this visit.   I have read or had this consent read to me. I understand the contents of this consent, which adequately explains the benefits and risks of the Services being provided via telemedicine.  I have been provided ample opportunity to ask questions regarding this consent and the Services and have had my questions answered to my satisfaction. I give my informed consent for the services to be provided through the use of telemedicine in my medical care

## 2024-10-16 NOTE — Progress Notes (Unsigned)
"  ° °  Virtual Visit via Telephone Note for Surgical Clearance    Because of [name] co-morbid illnesses, he is at least at moderate risk for complications without adequate follow up.  This format is felt to be most appropriate for this patient at this time.  Due to technical limitations with video connection (technology), today's appointment will be conducted as an audio only telehealth visit, and [name] verbally agreed to proceed in this manner.   All issues noted in this document were discussed and addressed.  No physical exam could be performed with this format.  Evaluation Performed:  Preoperative cardiovascular risk assessment _____________   Date:  10/17/2024   Patient ID:  [name], DOB Aug 07, 1971, MRN [id number]  Patient Location:  Provider location:  Home Office   Primary Care Provider:  Rilla Baller, MD Primary Cardiologist:  Redell Shallow, MD  Patient Profile  Paroxysmal atrial fibrillation  TTE 03/28/21: EF 60-65 Hypertension  Hyperlipidemia  Chronic Obstructive Pulmonary Disease  OSA   History of Present Illness    [name] is a 54 y.o. male who presents via audio/video conferencing for a telehealth visit today.   Pt was last seen in cardiology clinic on 07/09/24 by Jodie Passey, PA-C.  At that time [name] was doing well.    Pending Surgery: Spinal cord stimulator implant Type of anesthesia: MAC Medications to be held: n/a   Since his last visit, he has done well without chest pain, shortness of breath, syncope.   Physical Exam  Vital Signs:  [name] does not have vital signs available for review today. Given telephonic nature of communication, physical exam is limited. AAOx3. NAD. Normal affect.  Speech and respirations are unlabored.  Recent Labs    09/24/24 0744  CREATININE 1.17    Assessment & Plan   Assessment & Plan Preoperative cardiovascular examination [name] perioperative risk of a major cardiac event is  0.4% according to the Revised Cardiac Risk Index (RCRI).  Therefore, he is at low risk for perioperative complications.   His functional capacity is good at 4.31 METs according to the Duke Activity Status Index (DASI). Recommendations: According to ACC/AHA guidelines, no further cardiovascular testing needed.  The patient may proceed to surgery at acceptable risk.       A copy of this note will be routed to requesting surgeon.  Time:   Today, I have spent 3 minutes with the patient with telehealth technology discussing medical history, symptoms, and management plan.     Glendia Ferrier, PA-C 10/17/2024, 9:27 AM  "

## 2024-10-17 ENCOUNTER — Ambulatory Visit: Attending: Cardiology | Admitting: Physician Assistant

## 2024-10-17 DIAGNOSIS — Z01810 Encounter for preprocedural cardiovascular examination: Secondary | ICD-10-CM

## 2024-10-17 NOTE — Telephone Encounter (Signed)
 Notes faxed to surgeon.  Glendia Ferrier, PA-C  10/17/2024 4:31 PM

## 2024-10-25 NOTE — Telephone Encounter (Signed)
 Per Tammy's request, placed a 4 week compliance report into her review folder in B Pod after patient's pressure settings were changed.
